# Patient Record
Sex: Female | Born: 1945 | Race: White | Hispanic: No | Marital: Married | Smoking: Former smoker
Health system: Southern US, Community
[De-identification: ages and names within clinical notes are randomized; demographics above are authoritative.]

## PROBLEM LIST (undated history)

## (undated) DIAGNOSIS — Z87440 Personal history of urinary (tract) infections: Secondary | ICD-10-CM

## (undated) DIAGNOSIS — E785 Hyperlipidemia, unspecified: Secondary | ICD-10-CM

## (undated) DIAGNOSIS — F32A Depression, unspecified: Secondary | ICD-10-CM

## (undated) DIAGNOSIS — F329 Major depressive disorder, single episode, unspecified: Secondary | ICD-10-CM

## (undated) DIAGNOSIS — Z8701 Personal history of pneumonia (recurrent): Secondary | ICD-10-CM

## (undated) DIAGNOSIS — G4730 Sleep apnea, unspecified: Secondary | ICD-10-CM

## (undated) DIAGNOSIS — F4310 Post-traumatic stress disorder, unspecified: Secondary | ICD-10-CM

## (undated) DIAGNOSIS — J449 Chronic obstructive pulmonary disease, unspecified: Secondary | ICD-10-CM

## (undated) DIAGNOSIS — I10 Essential (primary) hypertension: Secondary | ICD-10-CM

## (undated) DIAGNOSIS — R0600 Dyspnea, unspecified: Secondary | ICD-10-CM

## (undated) DIAGNOSIS — R7303 Prediabetes: Secondary | ICD-10-CM

## (undated) DIAGNOSIS — J189 Pneumonia, unspecified organism: Secondary | ICD-10-CM

## (undated) DIAGNOSIS — E039 Hypothyroidism, unspecified: Secondary | ICD-10-CM

## (undated) DIAGNOSIS — M810 Age-related osteoporosis without current pathological fracture: Secondary | ICD-10-CM

## (undated) DIAGNOSIS — G4700 Insomnia, unspecified: Secondary | ICD-10-CM

## (undated) DIAGNOSIS — G2581 Restless legs syndrome: Secondary | ICD-10-CM

## (undated) DIAGNOSIS — M1712 Unilateral primary osteoarthritis, left knee: Secondary | ICD-10-CM

## (undated) HISTORY — PX: TOTAL VAGINAL HYSTERECTOMY: SHX2548

## (undated) HISTORY — DX: Chronic obstructive pulmonary disease, unspecified: J44.9

## (undated) HISTORY — DX: Age-related osteoporosis without current pathological fracture: M81.0

## (undated) HISTORY — DX: Major depressive disorder, single episode, unspecified: F32.9

## (undated) HISTORY — DX: Depression, unspecified: F32.A

## (undated) HISTORY — DX: Hyperlipidemia, unspecified: E78.5

## (undated) HISTORY — DX: Unilateral primary osteoarthritis, left knee: M17.12

## (undated) HISTORY — DX: Hypothyroidism, unspecified: E03.9

## (undated) HISTORY — DX: Personal history of pneumonia (recurrent): Z87.01

## (undated) HISTORY — DX: Post-traumatic stress disorder, unspecified: F43.10

## (undated) HISTORY — DX: Restless legs syndrome: G25.81

## (undated) HISTORY — PX: ABDOMINAL HYSTERECTOMY: SHX81

---
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
## 2000-11-18 ENCOUNTER — Encounter: Payer: Self-pay | Admitting: Family Medicine

## 2000-11-18 ENCOUNTER — Ambulatory Visit (HOSPITAL_COMMUNITY): Admission: RE | Admit: 2000-11-18 | Discharge: 2000-11-18 | Payer: Self-pay | Admitting: Family Medicine

## 2001-04-17 ENCOUNTER — Encounter: Payer: Self-pay | Admitting: Family Medicine

## 2001-04-17 ENCOUNTER — Ambulatory Visit (HOSPITAL_COMMUNITY): Admission: RE | Admit: 2001-04-17 | Discharge: 2001-04-17 | Payer: Self-pay | Admitting: Family Medicine

## 2002-04-03 ENCOUNTER — Encounter: Payer: Self-pay | Admitting: Family Medicine

## 2002-04-03 ENCOUNTER — Ambulatory Visit (HOSPITAL_COMMUNITY): Admission: RE | Admit: 2002-04-03 | Discharge: 2002-04-03 | Payer: Self-pay | Admitting: Family Medicine

## 2002-12-01 ENCOUNTER — Ambulatory Visit (HOSPITAL_COMMUNITY): Admission: RE | Admit: 2002-12-01 | Discharge: 2002-12-01 | Payer: Self-pay | Admitting: Family Medicine

## 2002-12-01 ENCOUNTER — Encounter: Payer: Self-pay | Admitting: Family Medicine

## 2003-08-16 ENCOUNTER — Ambulatory Visit (HOSPITAL_COMMUNITY): Admission: RE | Admit: 2003-08-16 | Discharge: 2003-08-16 | Payer: Self-pay | Admitting: Family Medicine

## 2004-12-13 HISTORY — PX: COLONOSCOPY: SHX174

## 2005-03-09 ENCOUNTER — Emergency Department (HOSPITAL_COMMUNITY): Admission: EM | Admit: 2005-03-09 | Discharge: 2005-03-09 | Payer: Self-pay | Admitting: Family Medicine

## 2005-09-12 ENCOUNTER — Emergency Department (HOSPITAL_COMMUNITY): Admission: EM | Admit: 2005-09-12 | Discharge: 2005-09-12 | Payer: Self-pay | Admitting: Family Medicine

## 2007-07-24 ENCOUNTER — Emergency Department (HOSPITAL_COMMUNITY): Admission: EM | Admit: 2007-07-24 | Discharge: 2007-07-24 | Payer: Self-pay | Admitting: Emergency Medicine

## 2008-02-07 ENCOUNTER — Emergency Department (HOSPITAL_COMMUNITY): Admission: EM | Admit: 2008-02-07 | Discharge: 2008-02-07 | Payer: Self-pay | Admitting: Family Medicine

## 2008-04-09 DIAGNOSIS — Z8701 Personal history of pneumonia (recurrent): Secondary | ICD-10-CM

## 2008-04-09 HISTORY — DX: Personal history of pneumonia (recurrent): Z87.01

## 2011-01-02 LAB — POCT URINALYSIS DIP (DEVICE)
Bilirubin Urine: NEGATIVE
Glucose, UA: NEGATIVE
Ketones, ur: NEGATIVE
Nitrite: NEGATIVE
Operator id: 247071
Protein, ur: NEGATIVE
Specific Gravity, Urine: 1.01
Urobilinogen, UA: 0.2
pH: 7

## 2011-01-08 LAB — POCT URINALYSIS DIP (DEVICE)
Bilirubin Urine: NEGATIVE
Glucose, UA: NEGATIVE
Ketones, ur: NEGATIVE
Nitrite: NEGATIVE
Operator id: 247071
Protein, ur: NEGATIVE
Specific Gravity, Urine: <=1.005
Urobilinogen, UA: 0.2
pH: 5.5

## 2013-08-12 ENCOUNTER — Other Ambulatory Visit: Payer: Self-pay | Admitting: Physician Assistant

## 2013-08-12 ENCOUNTER — Encounter: Payer: Self-pay | Admitting: Physician Assistant

## 2013-08-12 DIAGNOSIS — F419 Anxiety disorder, unspecified: Secondary | ICD-10-CM

## 2013-08-12 DIAGNOSIS — F32A Depression, unspecified: Secondary | ICD-10-CM

## 2013-08-12 DIAGNOSIS — F329 Major depressive disorder, single episode, unspecified: Secondary | ICD-10-CM | POA: Insufficient documentation

## 2013-08-12 DIAGNOSIS — J449 Chronic obstructive pulmonary disease, unspecified: Secondary | ICD-10-CM | POA: Insufficient documentation

## 2013-08-12 DIAGNOSIS — G2581 Restless legs syndrome: Secondary | ICD-10-CM | POA: Insufficient documentation

## 2013-08-12 DIAGNOSIS — E039 Hypothyroidism, unspecified: Secondary | ICD-10-CM | POA: Insufficient documentation

## 2013-08-12 NOTE — H&P (Signed)
TOTAL KNEE ADMISSION H&P  Patient is being admitted for left knee arthroscopy with uni vs total knee arthroplasty.  Subjective:  Chief Complaint:left knee pain.  HPI: [name], 68 y.o. female, has a history of pain and functional disability in the left knee due to arthritis and has failed non-surgical conservative treatments for greater than 12 weeks to includeNSAID's and/or analgesics, corticosteriod injections, viscosupplementation injections, flexibility and strengthening excercises, supervised PT with diminished ADL's post treatment, use of assistive devices, weight reduction as appropriate and activity modification.  Onset of symptoms was gradual, starting 10 years ago with gradually worsening course since that time. The patient noted no past surgery on the left knee(s).  Patient currently rates pain in the left knee(s) at 10 out of 10 with activity. Patient has night pain, worsening of pain with activity and weight bearing, pain that interferes with activities of daily living, crepitus and joint swelling.  Patient has evidence of subchondral sclerosis, periarticular osteophytes and joint space narrowing by imaging studies.  There is no active infection.  Patient Active Problem List   Diagnosis Date Noted  . Hypothyroidism   . Restless leg syndrome   . Depression   . Anxiety   . COPD (chronic obstructive pulmonary disease)    Past Medical History  Diagnosis Date  . Anxiety   . Depression   . History of pneumonia 2010    in ICU  . Restless leg syndrome   . Hypothyroidism   . Left knee DJD   . COPD (chronic obstructive pulmonary disease)     Past Surgical History  Procedure Laterality Date  . Total vaginal hysterectomy       (Not in a hospital admission) No Known Allergies   Outpatient Encounter Prescriptions as of 08/12/2013  Medication Sig  . ALPRAZolam (XANAX) 0.5 MG tablet Take 0.5 mg by mouth at bedtime as needed for anxiety.  Marland Kitchen. amitriptyline (ELAVIL) 75 MG tablet  Take 75 mg by mouth at bedtime.  Marland Kitchen. levothyroxine (SYNTHROID, LEVOTHROID) 100 MCG tablet Take 100 mcg by mouth daily before breakfast.  . meloxicam (MOBIC) 15 MG tablet Take 15 mg by mouth daily.  . vitamin E 200 UNIT capsule Take 200 Units by mouth daily.   History  Substance Use Topics  . Smoking status: Former Smoker -- 1.50 packs/day for 30 years    Types: Cigarettes    Quit date: 04/10/2003  . Smokeless tobacco: Not on file  . Alcohol Use: No    Family History  Problem Relation Age of Onset  . Stroke Mother   . Heart disease Father   . Heart attack Father   . Hypertension Father      Review of Systems  Constitutional: Negative.   Eyes: Negative.   Respiratory: Negative.   Cardiovascular: Negative.   Gastrointestinal: Negative.   Genitourinary: Negative.   Musculoskeletal: Positive for joint pain.  Skin: Negative.   Neurological: Negative.   Endo/Heme/Allergies: Negative.   Psychiatric/Behavioral: Negative.     Objective:  Physical Exam  Constitutional: She is oriented to person, place, and time. She appears well-developed and well-nourished.  HENT:  Head: Normocephalic and atraumatic.  Mouth/Throat: Oropharynx is clear and moist.  Eyes: Conjunctivae are normal. Pupils are equal, round, and reactive to light.  Neck: Neck supple.  Cardiovascular: Normal rate, regular rhythm and normal heart sounds.   Respiratory: Effort normal and breath sounds normal.  GI: Soft.  Genitourinary:  Not pertinent to current symptomatology therefore not examined.  Musculoskeletal:  Well-developed well-nourished  white female in no acute distress. Alert and oriented. Examination of her left knee reveals pain medially, mild varus deformity 1+ crepitation 1+ synovitis, range of motion is -5 to 125 degrees knee is stable with diffuse pain and normal patella tracking. Exam of the right knee reveals full range of motion without pain swelling weakness or instability. Vascular exam: pulses 2+  and symmetric.  Neurological: She is alert and oriented to person, place, and time.  Skin: Skin is warm and dry.  Psychiatric: She has a normal mood and affect.    Vital signs in last 24 hours: Last recorded: 05/06 1300   BP: 130/74 Pulse: 96  Temp: 98.3 F (36.8 C)    Height: 5' (1.524 m) SpO2: 97  Weight: 62.143 kg (137 lb)     Labs:   Estimated body mass index is 26.76 kg/(m^2) as calculated from the following:   Height as of this encounter: 5' (1.524 m).   Weight as of this encounter: 62.143 kg (137 lb).   Imaging Review Plain radiographs demonstrate severe degenerative joint disease of the left knee(s). The overall alignment ismild varus. The bone quality appears to be good for age and reported activity level.  Assessment/Plan:  End stage arthritis, left knee Patient Active Problem List   Diagnosis Date Noted  . Hypothyroidism   . Restless leg syndrome   . Depression   . Anxiety   . COPD (chronic obstructive pulmonary disease)      The patient history, physical examination, clinical judgment of the provider and imaging studies are consistent with end stage degenerative joint disease of the left knee(s) and total knee arthroplasty is deemed medically necessary. The treatment options including medical management, injection therapy arthroscopy and arthroplasty were discussed at length. The risks and benefits of total knee arthroplasty were presented and reviewed. The risks due to aseptic loosening, infection, stiffness, patella tracking problems, thromboembolic complications and other imponderables were discussed. The patient acknowledged the explanation, agreed to proceed with the plan and consent was signed. Patient is being admitted for inpatient treatment for surgery, pain control, PT, OT, prophylactic antibiotics, VTE prophylaxis, progressive ambulation and ADL's and discharge planning. The patient is planning to be discharged home with home health services Camaria Gerald A.  Jacquita Mulhearn, PA-C Physician Assistant [facility] [phone]  08/12/2013, 2:21 PM   

## 2013-08-14 ENCOUNTER — Ambulatory Visit (HOSPITAL_COMMUNITY)
Admission: RE | Admit: 2013-08-14 | Discharge: 2013-08-14 | Disposition: A | Discharge status: Home / Self Care | Payer: Medicare Other | Source: Ambulatory Visit | Attending: Physician Assistant | Admitting: Physician Assistant

## 2013-08-14 ENCOUNTER — Encounter (HOSPITAL_COMMUNITY): Payer: Self-pay

## 2013-08-14 ENCOUNTER — Encounter (HOSPITAL_COMMUNITY)
Admission: RE | Admit: 2013-08-14 | Discharge: 2013-08-14 | Disposition: A | Discharge status: Home / Self Care | Payer: Medicare Other | Source: Ambulatory Visit | Attending: Orthopedic Surgery | Admitting: Orthopedic Surgery

## 2013-08-14 DIAGNOSIS — F411 Generalized anxiety disorder: Secondary | ICD-10-CM | POA: Insufficient documentation

## 2013-08-14 DIAGNOSIS — J4489 Other specified chronic obstructive pulmonary disease: Secondary | ICD-10-CM | POA: Insufficient documentation

## 2013-08-14 DIAGNOSIS — Z01810 Encounter for preprocedural cardiovascular examination: Secondary | ICD-10-CM | POA: Insufficient documentation

## 2013-08-14 DIAGNOSIS — Z01818 Encounter for other preprocedural examination: Secondary | ICD-10-CM | POA: Insufficient documentation

## 2013-08-14 DIAGNOSIS — F3289 Other specified depressive episodes: Secondary | ICD-10-CM | POA: Insufficient documentation

## 2013-08-14 DIAGNOSIS — F329 Major depressive disorder, single episode, unspecified: Secondary | ICD-10-CM | POA: Insufficient documentation

## 2013-08-14 DIAGNOSIS — Z87891 Personal history of nicotine dependence: Secondary | ICD-10-CM | POA: Insufficient documentation

## 2013-08-14 DIAGNOSIS — E039 Hypothyroidism, unspecified: Secondary | ICD-10-CM | POA: Insufficient documentation

## 2013-08-14 DIAGNOSIS — Z01812 Encounter for preprocedural laboratory examination: Secondary | ICD-10-CM | POA: Insufficient documentation

## 2013-08-14 DIAGNOSIS — M1710 Unilateral primary osteoarthritis, unspecified knee: Secondary | ICD-10-CM | POA: Insufficient documentation

## 2013-08-14 DIAGNOSIS — J449 Chronic obstructive pulmonary disease, unspecified: Secondary | ICD-10-CM | POA: Insufficient documentation

## 2013-08-14 HISTORY — DX: Pneumonia, unspecified organism: J18.9

## 2013-08-14 HISTORY — DX: Personal history of urinary (tract) infections: Z87.440

## 2013-08-14 LAB — CBC WITH DIFFERENTIAL/PLATELET
BASOS PCT: 0 % (ref 0–1)
Basophils Absolute: 0 10*3/uL (ref 0.0–0.1)
Eosinophils Absolute: 0.3 10*3/uL (ref 0.0–0.7)
Eosinophils Relative: 4 % (ref 0–5)
HEMATOCRIT: 40.4 % (ref 36.0–46.0)
HEMOGLOBIN: 12.8 g/dL (ref 12.0–15.0)
LYMPHS ABS: 2.2 10*3/uL (ref 0.7–4.0)
LYMPHS PCT: 24 % (ref 12–46)
MCH: 26.8 pg (ref 26.0–34.0)
MCHC: 31.7 g/dL (ref 30.0–36.0)
MCV: 84.7 fL (ref 78.0–100.0)
MONO ABS: 0.7 10*3/uL (ref 0.1–1.0)
MONOS PCT: 8 % (ref 3–12)
NEUTROS PCT: 64 % (ref 43–77)
Neutro Abs: 5.7 10*3/uL (ref 1.7–7.7)
Platelets: 317 10*3/uL (ref 150–400)
RBC: 4.77 MIL/uL (ref 3.87–5.11)
RDW: 14.4 % (ref 11.5–15.5)
WBC: 8.9 10*3/uL (ref 4.0–10.5)

## 2013-08-14 LAB — URINALYSIS, ROUTINE W REFLEX MICROSCOPIC
BILIRUBIN URINE: NEGATIVE
GLUCOSE, UA: NEGATIVE mg/dL
Hgb urine dipstick: NEGATIVE
Ketones, ur: NEGATIVE mg/dL
LEUKOCYTES UA: NEGATIVE
NITRITE: NEGATIVE
Protein, ur: NEGATIVE mg/dL
SPECIFIC GRAVITY, URINE: 1.011 (ref 1.005–1.030)
Urobilinogen, UA: 0.2 mg/dL (ref 0.0–1.0)
pH: 6 (ref 5.0–8.0)

## 2013-08-14 LAB — COMPREHENSIVE METABOLIC PANEL
ALBUMIN: 4 g/dL (ref 3.5–5.2)
ALK PHOS: 128 U/L — AB (ref 39–117)
ALT: 27 U/L (ref 0–35)
AST: 28 U/L (ref 0–37)
BILIRUBIN TOTAL: 0.3 mg/dL (ref 0.3–1.2)
BUN: 17 mg/dL (ref 6–23)
CHLORIDE: 104 meq/L (ref 96–112)
CO2: 24 meq/L (ref 19–32)
CREATININE: 0.84 mg/dL (ref 0.50–1.10)
Calcium: 10.3 mg/dL (ref 8.4–10.5)
GFR calc Af Amer: 83 mL/min — ABNORMAL LOW (ref >90)
GFR calc non Af Amer: 71 mL/min — ABNORMAL LOW (ref >90)
GLUCOSE: 85 mg/dL (ref 70–99)
POTASSIUM: 5.3 meq/L (ref 3.7–5.3)
Sodium: 141 mEq/L (ref 137–147)
Total Protein: 6.9 g/dL (ref 6.0–8.3)

## 2013-08-14 LAB — PROTIME-INR
INR: 0.9 (ref 0.00–1.49)
Prothrombin Time: 12 seconds (ref 11.6–15.2)

## 2013-08-14 LAB — TYPE AND SCREEN
ABO/RH(D): O POS
Antibody Screen: NEGATIVE

## 2013-08-14 LAB — ABO/RH: ABO/RH(D): O POS

## 2013-08-14 LAB — SURGICAL PCR SCREEN
MRSA, PCR: POSITIVE — AB
Staphylococcus aureus: POSITIVE — AB

## 2013-08-14 LAB — APTT: aPTT: 31 seconds (ref 24–37)

## 2013-08-14 NOTE — Pre-Procedure Instructions (Addendum)
[name] [name]  08/14/2013   Your procedure is scheduled on:  08/24/13  [facility] at 530 AM.  Call this number if you have problems the morning of surgery: [phone]   Remember:   Do not eat food or drink liquids after midnight.   Take these medicines the morning of surgery with A SIP OF WATER:   inhaler if needed,bring with you      Take all meds as ordered until day of surgery except as instructed below or per dr     Despina AriasSTOP all herbel meds, nsaids (aleve,naproxen,advil,ibuprofen) 5 days prior to surgery including mobic, vit e, aspirin   Do not wear jewelry, make-up or nail polish.  Do not wear lotions, powders, or perfumes. You may wear deodorant.  Do not shave 48 hours prior to surgery. Men may shave face and neck.  Do not bring valuables to the hospital.  [facility] is not responsible                  for any belongings or valuables.               Contacts, dentures or bridgework may not be worn into surgery.  Leave suitcase in the car. After surgery it may be brought to your room.  For patients admitted to the hospital, discharge time is determined by your                treatment team.               Patients discharged the day of surgery will not be allowed to drive  home.  Name and phone number of your driver:   Special Instructions:  Special Instructions: [facility] - Preparing for Surgery  Before surgery, you can play an important role.  Because skin is not sterile, your skin needs to be as free of germs as possible.  You can reduce the number of germs on you skin by washing with CHG (chlorahexidine gluconate) soap before surgery.  CHG is an antiseptic cleaner which kills germs and bonds with the skin to continue killing germs even after washing.  Please DO NOT use if you have an allergy to CHG or antibacterial soaps.  If your skin becomes reddened/irritated stop using the CHG and inform your nurse when you arrive at Short Stay.  Do not  shave (including legs and underarms) for at least 48 hours prior to the first CHG shower.  You may shave your face.  Please follow these instructions carefully:   1.  Shower with CHG Soap the night before surgery and the morning of Surgery.  2.  If you choose to wash your hair, wash your hair first as usual with your normal shampoo.  3.  After you shampoo, rinse your hair and body thoroughly to remove the Shampoo.  4.  Use CHG as you would any other liquid soap.  You can apply chg directly  to the skin and wash gently with scrungie or a clean washcloth.  5.  Apply the CHG Soap to your body ONLY FROM THE NECK DOWN.  Do not use on open wounds or open sores.  Avoid contact with your eyes ears, mouth and genitals (private parts).  Wash genitals (private parts)       with your normal soap.  6.  Wash thoroughly, paying special attention to the area where your surgery will be performed.  7.  Thoroughly rinse your body with  warm water from the neck down.  8.  DO NOT shower/wash with your normal soap after using and rinsing off the CHG Soap.  9.  Pat yourself dry with a clean towel.            10.  Wear clean pajamas.            11.  Place clean sheets on your bed the night of your first shower and do not sleep with pets.  Day of Surgery  Do not apply any lotions/deodorants the morning of surgery.  Please wear clean clothes to the hospital/surgery center.   Please read over the following fact sheets that you were given: Pain Booklet, Coughing and Deep Breathing, Blood Transfusion Information, Total Joint Packet, MRSA Information and Surgical Site Infection Prevention

## 2013-08-15 LAB — URINE CULTURE

## 2013-08-17 NOTE — Progress Notes (Signed)
Anesthesia Chart Review:  Patient is a 68 year old female scheduled for left knee arthroscopy with uni-versus total knee replacement on 08/24/13 by Dr. Salvatore Marvelobert [facility].    History includes former smoker, COPD, PNA with VDRF '10, RLS, hypothyroidism, anxiety, depression, hysterectomy, childhood hematuria. PCP is listed as Dr. Kirstie PeriAshish Shah.  Patient told her PAT RN that she had no acute COPD issues since 2013.  EKG on 08/14/13 showed NSR.  Preoperative CXR and labs noted.  Urine culture showed insignificant growth.  Further evaluation by her assigned anesthesiologist on the day of surgery, but if no acute changes that I would anticipate she could proceed as planned.  Velna Ochsllison Kanchan Gal, PA-C [facility]/Anesthesiology Phone [phone] 08/17/2013 1:28 PM

## 2013-08-23 MED ORDER — CHLORHEXIDINE GLUCONATE 4 % EX LIQD
60.0000 mL | Freq: Once | CUTANEOUS | Status: DC
  Filled 2013-08-23: qty 60

## 2013-08-23 MED ORDER — POVIDONE-IODINE 7.5 % EX SOLN
Freq: Once | CUTANEOUS | Status: DC
  Filled 2013-08-23: qty 118

## 2013-08-23 MED ORDER — CEFAZOLIN SODIUM-DEXTROSE 2-3 GM-% IV SOLR
2.0000 g | INTRAVENOUS | Status: DC
  Filled 2013-08-23: qty 50

## 2013-08-24 ENCOUNTER — Encounter (HOSPITAL_COMMUNITY): Payer: Self-pay | Admitting: *Deleted

## 2013-08-24 ENCOUNTER — Encounter (HOSPITAL_COMMUNITY)
Admission: RE | Disposition: A | Discharge status: Home Health | Payer: Self-pay | Source: Ambulatory Visit | Attending: Orthopedic Surgery

## 2013-08-24 ENCOUNTER — Inpatient Hospital Stay (HOSPITAL_COMMUNITY)
Admission: RE | Admit: 2013-08-24 | Discharge: 2013-08-25 | DRG: 470 | Disposition: A | Discharge status: Home Health | Payer: Medicare Other | Source: Ambulatory Visit | Attending: Orthopedic Surgery | Admitting: Orthopedic Surgery

## 2013-08-24 ENCOUNTER — Encounter (HOSPITAL_COMMUNITY): Payer: Medicare Other | Admitting: Vascular Surgery

## 2013-08-24 ENCOUNTER — Inpatient Hospital Stay (HOSPITAL_COMMUNITY): Payer: Medicare Other | Admitting: Anesthesiology

## 2013-08-24 DIAGNOSIS — F32A Depression, unspecified: Secondary | ICD-10-CM | POA: Diagnosis present

## 2013-08-24 DIAGNOSIS — Z8249 Family history of ischemic heart disease and other diseases of the circulatory system: Secondary | ICD-10-CM

## 2013-08-24 DIAGNOSIS — F411 Generalized anxiety disorder: Secondary | ICD-10-CM | POA: Diagnosis present

## 2013-08-24 DIAGNOSIS — Z87891 Personal history of nicotine dependence: Secondary | ICD-10-CM

## 2013-08-24 DIAGNOSIS — F3289 Other specified depressive episodes: Secondary | ICD-10-CM | POA: Diagnosis present

## 2013-08-24 DIAGNOSIS — F329 Major depressive disorder, single episode, unspecified: Secondary | ICD-10-CM | POA: Diagnosis present

## 2013-08-24 DIAGNOSIS — F419 Anxiety disorder, unspecified: Secondary | ICD-10-CM | POA: Diagnosis present

## 2013-08-24 DIAGNOSIS — M2240 Chondromalacia patellae, unspecified knee: Secondary | ICD-10-CM | POA: Diagnosis present

## 2013-08-24 DIAGNOSIS — G2581 Restless legs syndrome: Secondary | ICD-10-CM | POA: Diagnosis present

## 2013-08-24 DIAGNOSIS — J4489 Other specified chronic obstructive pulmonary disease: Secondary | ICD-10-CM | POA: Diagnosis present

## 2013-08-24 DIAGNOSIS — M1710 Unilateral primary osteoarthritis, unspecified knee: Principal | ICD-10-CM | POA: Diagnosis present

## 2013-08-24 DIAGNOSIS — M179 Osteoarthritis of knee, unspecified: Secondary | ICD-10-CM | POA: Diagnosis present

## 2013-08-24 DIAGNOSIS — M1712 Unilateral primary osteoarthritis, left knee: Secondary | ICD-10-CM | POA: Diagnosis present

## 2013-08-24 DIAGNOSIS — E039 Hypothyroidism, unspecified: Secondary | ICD-10-CM | POA: Diagnosis present

## 2013-08-24 DIAGNOSIS — Z823 Family history of stroke: Secondary | ICD-10-CM

## 2013-08-24 DIAGNOSIS — M23305 Other meniscus derangements, unspecified medial meniscus, unspecified knee: Secondary | ICD-10-CM | POA: Diagnosis present

## 2013-08-24 DIAGNOSIS — J449 Chronic obstructive pulmonary disease, unspecified: Secondary | ICD-10-CM | POA: Diagnosis present

## 2013-08-24 HISTORY — PX: KNEE ARTHROSCOPY WITH MEDIAL MENISECTOMY: SHX5651

## 2013-08-24 HISTORY — PX: PARTIAL KNEE ARTHROPLASTY: SHX2174

## 2013-08-24 SURGERY — ARTHROPLASTY, KNEE, UNICOMPARTMENTAL
Anesthesia: Regional | Site: Knee | Laterality: Left

## 2013-08-24 MED ORDER — FENTANYL CITRATE 0.05 MG/ML IJ SOLN
INTRAMUSCULAR | Status: AC
  Filled 2013-08-24: qty 5

## 2013-08-24 MED ORDER — ROCURONIUM BROMIDE 100 MG/10ML IV SOLN
INTRAVENOUS | Status: DC | PRN
  Administered 2013-08-24: 35 mg via INTRAVENOUS

## 2013-08-24 MED ORDER — VITAMIN D3 25 MCG (1000 UNIT) PO TABS
2000.0000 [IU] | ORAL_TABLET | Freq: Every day | ORAL | Status: DC
  Administered 2013-08-24 – 2013-08-25 (×2): 2000 [IU] via ORAL
  Filled 2013-08-24 (×2): qty 2

## 2013-08-24 MED ORDER — PROPOFOL 10 MG/ML IV BOLUS
INTRAVENOUS | Status: AC
  Filled 2013-08-24: qty 20

## 2013-08-24 MED ORDER — OXYCODONE HCL 5 MG PO TABS: 5.0000 mg | ORAL_TABLET | Freq: Once | ORAL | Status: DC | PRN

## 2013-08-24 MED ORDER — VITAMIN D 50 MCG (2000 UT) PO CAPS: 2000.0000 [IU] | ORAL_CAPSULE | Freq: Every day | ORAL | Status: DC

## 2013-08-24 MED ORDER — GLYCOPYRROLATE 0.2 MG/ML IJ SOLN
INTRAMUSCULAR | Status: AC
  Filled 2013-08-24: qty 2

## 2013-08-24 MED ORDER — OXYCODONE HCL 5 MG/5ML PO SOLN: 5.0000 mg | Freq: Once | ORAL | Status: DC | PRN

## 2013-08-24 MED ORDER — DOCUSATE SODIUM 100 MG PO CAPS
100.0000 mg | ORAL_CAPSULE | Freq: Two times a day (BID) | ORAL | Status: DC
  Administered 2013-08-24 – 2013-08-25 (×2): 100 mg via ORAL
  Filled 2013-08-24 (×3): qty 1

## 2013-08-24 MED ORDER — ACETAMINOPHEN 325 MG PO TABS: 650.0000 mg | ORAL_TABLET | Freq: Four times a day (QID) | ORAL | Status: DC | PRN

## 2013-08-24 MED ORDER — AMITRIPTYLINE HCL 75 MG PO TABS
75.0000 mg | ORAL_TABLET | Freq: Every day | ORAL | Status: DC
  Administered 2013-08-24: 75 mg via ORAL
  Filled 2013-08-24 (×2): qty 1

## 2013-08-24 MED ORDER — ALBUTEROL SULFATE (2.5 MG/3ML) 0.083% IN NEBU: 2.5000 mg | INHALATION_SOLUTION | Freq: Four times a day (QID) | RESPIRATORY_TRACT | Status: DC | PRN

## 2013-08-24 MED ORDER — TOBRAMYCIN SULFATE 1.2 G IJ SOLR
INTRAMUSCULAR | Status: DC | PRN
  Administered 2013-08-24: 1.2 g

## 2013-08-24 MED ORDER — CEFAZOLIN SODIUM-DEXTROSE 2-3 GM-% IV SOLR
INTRAVENOUS | Status: DC | PRN
  Administered 2013-08-24: 2 g via INTRAVENOUS

## 2013-08-24 MED ORDER — MIDAZOLAM HCL 5 MG/5ML IJ SOLN
INTRAMUSCULAR | Status: DC | PRN
  Administered 2013-08-24 (×2): 1 mg via INTRAVENOUS

## 2013-08-24 MED ORDER — ACETAMINOPHEN 650 MG RE SUPP: 650.0000 mg | Freq: Four times a day (QID) | RECTAL | Status: DC | PRN

## 2013-08-24 MED ORDER — CEFAZOLIN SODIUM 1-5 GM-% IV SOLN
1.0000 g | Freq: Four times a day (QID) | INTRAVENOUS | Status: AC
  Administered 2013-08-24 – 2013-08-25 (×2): 1 g via INTRAVENOUS
  Filled 2013-08-24 (×2): qty 50

## 2013-08-24 MED ORDER — HYDROMORPHONE HCL PF 1 MG/ML IJ SOLN
INTRAMUSCULAR | Status: AC
  Filled 2013-08-24: qty 1

## 2013-08-24 MED ORDER — LIDOCAINE HCL (CARDIAC) 20 MG/ML IV SOLN
INTRAVENOUS | Status: AC
  Filled 2013-08-24: qty 5

## 2013-08-24 MED ORDER — VANCOMYCIN HCL IN DEXTROSE 1-5 GM/200ML-% IV SOLN
1000.0000 mg | INTRAVENOUS | Status: AC
  Filled 2013-08-24: qty 200

## 2013-08-24 MED ORDER — LIDOCAINE HCL (CARDIAC) 20 MG/ML IV SOLN
INTRAVENOUS | Status: DC | PRN
  Administered 2013-08-24: 40 mg via INTRAVENOUS

## 2013-08-24 MED ORDER — ROCURONIUM BROMIDE 50 MG/5ML IV SOLN
INTRAVENOUS | Status: AC
  Filled 2013-08-24: qty 1

## 2013-08-24 MED ORDER — ARTIFICIAL TEARS OP OINT
TOPICAL_OINTMENT | OPHTHALMIC | Status: DC | PRN
  Administered 2013-08-24: 1 via OPHTHALMIC

## 2013-08-24 MED ORDER — CELECOXIB 200 MG PO CAPS
200.0000 mg | ORAL_CAPSULE | Freq: Two times a day (BID) | ORAL | Status: DC
  Administered 2013-08-24 – 2013-08-25 (×2): 200 mg via ORAL
  Filled 2013-08-24 (×4): qty 1

## 2013-08-24 MED ORDER — BISACODYL 5 MG PO TBEC
10.0000 mg | DELAYED_RELEASE_TABLET | Freq: Every day | ORAL | Status: DC
  Administered 2013-08-24: 10 mg via ORAL
  Filled 2013-08-24: qty 2

## 2013-08-24 MED ORDER — EPINEPHRINE HCL 1 MG/ML IJ SOLN
INTRAMUSCULAR | Status: AC
  Filled 2013-08-24: qty 1

## 2013-08-24 MED ORDER — MIDAZOLAM HCL 2 MG/2ML IJ SOLN
INTRAMUSCULAR | Status: AC
  Filled 2013-08-24: qty 2

## 2013-08-24 MED ORDER — METOCLOPRAMIDE HCL 10 MG PO TABS: 5.0000 mg | ORAL_TABLET | Freq: Three times a day (TID) | ORAL | Status: DC | PRN

## 2013-08-24 MED ORDER — 0.9 % SODIUM CHLORIDE (POUR BTL) OPTIME
TOPICAL | Status: DC | PRN
  Administered 2013-08-24: 1000 mL

## 2013-08-24 MED ORDER — ONDANSETRON HCL 4 MG/2ML IJ SOLN
INTRAMUSCULAR | Status: AC
  Filled 2013-08-24: qty 2

## 2013-08-24 MED ORDER — DIPHENHYDRAMINE HCL 12.5 MG/5ML PO ELIX: 12.5000 mg | ORAL_SOLUTION | ORAL | Status: DC | PRN

## 2013-08-24 MED ORDER — FENTANYL CITRATE 0.05 MG/ML IJ SOLN
INTRAMUSCULAR | Status: DC | PRN
  Administered 2013-08-24: 50 ug via INTRAVENOUS
  Administered 2013-08-24: 100 ug via INTRAVENOUS
  Administered 2013-08-24 (×2): 50 ug via INTRAVENOUS

## 2013-08-24 MED ORDER — SODIUM CHLORIDE 0.9 % IR SOLN
Status: DC | PRN
  Administered 2013-08-24: 1000 mL

## 2013-08-24 MED ORDER — BUPIVACAINE-EPINEPHRINE (PF) 0.5% -1:200000 IJ SOLN
INTRAMUSCULAR | Status: AC
  Filled 2013-08-24: qty 30

## 2013-08-24 MED ORDER — HYDROMORPHONE HCL PF 1 MG/ML IJ SOLN
0.2500 mg | INTRAMUSCULAR | Status: DC | PRN
  Administered 2013-08-24 (×2): 0.5 mg via INTRAVENOUS

## 2013-08-24 MED ORDER — ONDANSETRON HCL 4 MG/2ML IJ SOLN
4.0000 mg | Freq: Four times a day (QID) | INTRAMUSCULAR | Status: DC | PRN
  Administered 2013-08-24: 4 mg via INTRAVENOUS
  Filled 2013-08-24: qty 2

## 2013-08-24 MED ORDER — TOBRAMYCIN SULFATE 1.2 G IJ SOLR
INTRAMUSCULAR | Status: AC
  Filled 2013-08-24: qty 1.2

## 2013-08-24 MED ORDER — BUPIVACAINE-EPINEPHRINE 0.25% -1:200000 IJ SOLN
INTRAMUSCULAR | Status: DC | PRN
  Administered 2013-08-24: 30 mL

## 2013-08-24 MED ORDER — ONDANSETRON HCL 4 MG PO TABS
4.0000 mg | ORAL_TABLET | Freq: Four times a day (QID) | ORAL | Status: DC | PRN
  Administered 2013-08-25: 4 mg via ORAL
  Filled 2013-08-24: qty 1

## 2013-08-24 MED ORDER — DEXAMETHASONE 6 MG PO TABS
10.0000 mg | ORAL_TABLET | Freq: Three times a day (TID) | ORAL | Status: AC
  Administered 2013-08-24 – 2013-08-25 (×2): 10 mg via ORAL
  Filled 2013-08-24 (×3): qty 1

## 2013-08-24 MED ORDER — BUPIVACAINE-EPINEPHRINE (PF) 0.25% -1:200000 IJ SOLN
INTRAMUSCULAR | Status: AC
  Filled 2013-08-24: qty 30

## 2013-08-24 MED ORDER — OXYCODONE HCL 5 MG PO TABS
5.0000 mg | ORAL_TABLET | ORAL | Status: DC | PRN
  Administered 2013-08-24: 5 mg via ORAL
  Administered 2013-08-24 – 2013-08-25 (×4): 10 mg via ORAL
  Filled 2013-08-24: qty 1
  Filled 2013-08-24 (×4): qty 2

## 2013-08-24 MED ORDER — ALUM & MAG HYDROXIDE-SIMETH 200-200-20 MG/5ML PO SUSP: 30.0000 mL | ORAL | Status: DC | PRN

## 2013-08-24 MED ORDER — RIVAROXABAN 10 MG PO TABS
10.0000 mg | ORAL_TABLET | Freq: Every day | ORAL | Status: DC
Start: 2013-08-25 — End: 2013-08-25
  Administered 2013-08-25: 10 mg via ORAL
  Filled 2013-08-24 (×2): qty 1

## 2013-08-24 MED ORDER — LACTATED RINGERS IV SOLN
INTRAVENOUS | Status: DC | PRN
  Administered 2013-08-24: 07:00:00 via INTRAVENOUS

## 2013-08-24 MED ORDER — NEOSTIGMINE METHYLSULFATE 10 MG/10ML IV SOLN
INTRAVENOUS | Status: AC
  Filled 2013-08-24: qty 1

## 2013-08-24 MED ORDER — DEXAMETHASONE SODIUM PHOSPHATE 10 MG/ML IJ SOLN
10.0000 mg | Freq: Three times a day (TID) | INTRAMUSCULAR | Status: AC
  Administered 2013-08-24: 10 mg via INTRAVENOUS
  Filled 2013-08-24 (×3): qty 1

## 2013-08-24 MED ORDER — CEFUROXIME SODIUM 1.5 G IJ SOLR
INTRAMUSCULAR | Status: AC
  Filled 2013-08-24: qty 1.5

## 2013-08-24 MED ORDER — HYDROMORPHONE HCL PF 1 MG/ML IJ SOLN
0.5000 mg | INTRAMUSCULAR | Status: DC | PRN
  Administered 2013-08-24: 1 mg via INTRAVENOUS
  Administered 2013-08-24: 0.5 mg via INTRAVENOUS
  Administered 2013-08-25: 1 mg via INTRAVENOUS
  Filled 2013-08-24 (×3): qty 1

## 2013-08-24 MED ORDER — SODIUM CHLORIDE 0.9 % IV SOLN
1000.0000 mg | INTRAVENOUS | Status: DC | PRN
  Administered 2013-08-24: 1000 mg via INTRAVENOUS

## 2013-08-24 MED ORDER — ONDANSETRON HCL 4 MG/2ML IJ SOLN
INTRAMUSCULAR | Status: DC | PRN
  Administered 2013-08-24: 4 mg via INTRAVENOUS

## 2013-08-24 MED ORDER — NEOSTIGMINE METHYLSULFATE 10 MG/10ML IV SOLN
INTRAVENOUS | Status: DC | PRN
  Administered 2013-08-24: 3 mg via INTRAVENOUS

## 2013-08-24 MED ORDER — POTASSIUM CHLORIDE IN NACL 20-0.9 MEQ/L-% IV SOLN
INTRAVENOUS | Status: DC
  Administered 2013-08-25: via INTRAVENOUS
  Filled 2013-08-24 (×5): qty 1000

## 2013-08-24 MED ORDER — PHENOL 1.4 % MT LIQD: 1.0000 | OROMUCOSAL | Status: DC | PRN

## 2013-08-24 MED ORDER — LACTATED RINGERS IV SOLN: INTRAVENOUS | Status: DC

## 2013-08-24 MED ORDER — ALPRAZOLAM 0.5 MG PO TABS: 0.5000 mg | ORAL_TABLET | Freq: Every evening | ORAL | Status: DC | PRN

## 2013-08-24 MED ORDER — METOCLOPRAMIDE HCL 5 MG/ML IJ SOLN
5.0000 mg | Freq: Three times a day (TID) | INTRAMUSCULAR | Status: DC | PRN
  Administered 2013-08-24: 10 mg via INTRAVENOUS
  Filled 2013-08-24: qty 2

## 2013-08-24 MED ORDER — VANCOMYCIN HCL IN DEXTROSE 1-5 GM/200ML-% IV SOLN
1000.0000 mg | Freq: Two times a day (BID) | INTRAVENOUS | Status: AC
  Administered 2013-08-24: 1000 mg via INTRAVENOUS
  Filled 2013-08-24: qty 200

## 2013-08-24 MED ORDER — GLYCOPYRROLATE 0.2 MG/ML IJ SOLN
INTRAMUSCULAR | Status: DC | PRN
  Administered 2013-08-24: 0.4 mg via INTRAVENOUS

## 2013-08-24 MED ORDER — MENTHOL 3 MG MT LOZG: 1.0000 | LOZENGE | OROMUCOSAL | Status: DC | PRN

## 2013-08-24 MED ORDER — PROPOFOL 10 MG/ML IV BOLUS
INTRAVENOUS | Status: DC | PRN
  Administered 2013-08-24: 150 mg via INTRAVENOUS

## 2013-08-24 MED ORDER — LEVOTHYROXINE SODIUM 100 MCG PO TABS
100.0000 ug | ORAL_TABLET | Freq: Every day | ORAL | Status: DC
  Administered 2013-08-25: 100 ug via ORAL
  Filled 2013-08-24 (×2): qty 1

## 2013-08-24 MED ORDER — ARTIFICIAL TEARS OP OINT
TOPICAL_OINTMENT | OPHTHALMIC | Status: AC
  Filled 2013-08-24: qty 3.5

## 2013-08-24 MED ORDER — ALBUTEROL SULFATE HFA 108 (90 BASE) MCG/ACT IN AERS: 1.0000 | INHALATION_SPRAY | Freq: Four times a day (QID) | RESPIRATORY_TRACT | Status: DC | PRN

## 2013-08-24 MED ORDER — ONDANSETRON HCL 4 MG/2ML IJ SOLN: 4.0000 mg | Freq: Once | INTRAMUSCULAR | Status: DC | PRN

## 2013-08-24 SURGICAL SUPPLY — 106 items
BANDAGE ELASTIC 4 VELCRO ST LF (GAUZE/BANDAGES/DRESSINGS) ×1 IMPLANT
BANDAGE ELASTIC 6 VELCRO ST LF (GAUZE/BANDAGES/DRESSINGS) ×1 IMPLANT
BANDAGE ESMARK 6X9 LF (GAUZE/BANDAGES/DRESSINGS) ×2 IMPLANT
BLADE 10 SAFETY STRL DISP (BLADE) ×3 IMPLANT
BLADE CUDA 5.5 (BLADE) IMPLANT
BLADE CUTTER GATOR 3.5 (BLADE) ×2 IMPLANT
BLADE GREAT WHITE 4.2 (BLADE) ×3 IMPLANT
BLADE SAGITTAL 25.0X1.19X90 (BLADE) ×3 IMPLANT
BLADE SAW SGTL 11.0X1.19X90.0M (BLADE) IMPLANT
BLADE SAW SGTL 13.0X1.19X90.0M (BLADE) ×3 IMPLANT
BLADE SURG 10 STRL SS (BLADE) ×6 IMPLANT
BLADE SURG 11 STRL SS (BLADE) ×3 IMPLANT
BNDG CMPR 9X6 STRL LF SNTH (GAUZE/BANDAGES/DRESSINGS) ×2
BNDG CMPR MED 15X6 ELC VLCR LF (GAUZE/BANDAGES/DRESSINGS) ×2
BNDG ELASTIC 6X15 VLCR STRL LF (GAUZE/BANDAGES/DRESSINGS) ×3 IMPLANT
BNDG ESMARK 6X9 LF (GAUZE/BANDAGES/DRESSINGS) ×3
BOWL SMART MIX CTS (DISPOSABLE) ×3 IMPLANT
BUR OVAL 6.0 (BURR) IMPLANT
CAPT KNEE OXFORD ×2 IMPLANT
CEMENT HV SMART SET (Cement) ×6 IMPLANT
CO AXIAL FAN SPRAY TIP SOFT SH (MISCELLANEOUS) ×4 IMPLANT
COVER SURGICAL LIGHT HANDLE (MISCELLANEOUS) ×3 IMPLANT
CUFF TOURNIQUET SINGLE 34IN LL (TOURNIQUET CUFF) ×3 IMPLANT
DRAPE ARTHROSCOPY W/POUCH 114 (DRAPES) ×3 IMPLANT
DRAPE EXTREMITY T 121X128X90 (DRAPE) ×3 IMPLANT
DRAPE INCISE IOBAN 66X45 STRL (DRAPES) ×3 IMPLANT
DRAPE PROXIMA HALF (DRAPES) ×3 IMPLANT
DRAPE U-SHAPE 47X51 STRL (DRAPES) ×3 IMPLANT
DRSG ADAPTIC 3X8 NADH LF (GAUZE/BANDAGES/DRESSINGS) ×3 IMPLANT
DRSG PAD ABDOMINAL 8X10 ST (GAUZE/BANDAGES/DRESSINGS) ×6 IMPLANT
DURAPREP 26ML APPLICATOR (WOUND CARE) ×6 IMPLANT
ELECT CAUTERY BLADE 6.4 (BLADE) ×3 IMPLANT
ELECT REM PT RETURN 9FT ADLT (ELECTROSURGICAL) ×3
ELECTRODE REM PT RTRN 9FT ADLT (ELECTROSURGICAL) ×2 IMPLANT
EVACUATOR 1/8 PVC DRAIN (DRAIN) ×3 IMPLANT
FACESHIELD WRAPAROUND (MASK) ×3 IMPLANT
FACESHIELD WRAPAROUND OR TEAM (MASK) ×1 IMPLANT
FILTER STRAW FLUID ASPIR (MISCELLANEOUS) ×3 IMPLANT
GAUZE XEROFORM 1X8 LF (GAUZE/BANDAGES/DRESSINGS) ×3 IMPLANT
GLOVE BIO SURGEON STRL SZ7 (GLOVE) ×5 IMPLANT
GLOVE BIOGEL PI IND STRL 6.5 (GLOVE) ×4 IMPLANT
GLOVE BIOGEL PI IND STRL 7.0 (GLOVE) ×3 IMPLANT
GLOVE BIOGEL PI IND STRL 7.5 (GLOVE) ×2 IMPLANT
GLOVE BIOGEL PI IND STRL 8 (GLOVE) ×2 IMPLANT
GLOVE BIOGEL PI INDICATOR 6.5 (GLOVE) ×4
GLOVE BIOGEL PI INDICATOR 7.0 (GLOVE) ×2
GLOVE BIOGEL PI INDICATOR 7.5 (GLOVE) ×1
GLOVE BIOGEL PI INDICATOR 8 (GLOVE) ×1
GLOVE BIOGEL PI ORTHO PRO 7.5 (GLOVE) ×1
GLOVE BIOGEL PI ORTHO PRO SZ8 (GLOVE) ×1
GLOVE ECLIPSE 6.0 STRL STRAW (GLOVE) ×4 IMPLANT
GLOVE ECLIPSE 8.0 STRL XLNG CF (GLOVE) ×2 IMPLANT
GLOVE PI ORTHO PRO STRL 7.5 (GLOVE) ×2 IMPLANT
GLOVE PI ORTHO PRO STRL SZ8 (GLOVE) ×1 IMPLANT
GLOVE SS BIOGEL STRL SZ 7.5 (GLOVE) ×2 IMPLANT
GLOVE SUPERSENSE BIOGEL SZ 7.5 (GLOVE) ×1
GOWN STRL REUS W/ TWL LRG LVL3 (GOWN DISPOSABLE) ×6 IMPLANT
GOWN STRL REUS W/ TWL XL LVL3 (GOWN DISPOSABLE) ×6 IMPLANT
GOWN STRL REUS W/TWL LRG LVL3 (GOWN DISPOSABLE) ×9
GOWN STRL REUS W/TWL XL LVL3 (GOWN DISPOSABLE) ×9
HANDPIECE INTERPULSE COAX TIP (DISPOSABLE) ×3
HOOD PEEL AWAY FACE SHEILD DIS (HOOD) ×15 IMPLANT
IMMOBILIZER KNEE 22 UNIV (SOFTGOODS) ×2 IMPLANT
KIT BASIN OR (CUSTOM PROCEDURE TRAY) ×3 IMPLANT
KIT ROOM TURNOVER OR (KITS) ×3 IMPLANT
MANIFOLD NEPTUNE II (INSTRUMENTS) ×3 IMPLANT
NDL 18GX1X1/2 (RX/OR ONLY) (NEEDLE) ×1 IMPLANT
NDL SPNL 18GX3.5 QUINCKE PK (NEEDLE) ×1 IMPLANT
NEEDLE 18GX1X1/2 (RX/OR ONLY) (NEEDLE) ×3 IMPLANT
NEEDLE SPNL 18GX3.5 QUINCKE PK (NEEDLE) ×3 IMPLANT
NS IRRIG 1000ML POUR BTL (IV SOLUTION) ×3 IMPLANT
PACK ARTHROSCOPY DSU (CUSTOM PROCEDURE TRAY) ×3 IMPLANT
PACK TOTAL JOINT (CUSTOM PROCEDURE TRAY) ×3 IMPLANT
PAD ABD 8X10 STRL (GAUZE/BANDAGES/DRESSINGS) ×2 IMPLANT
PAD ARMBOARD 7.5X6 YLW CONV (MISCELLANEOUS) ×6 IMPLANT
PAD CAST 4YDX4 CTTN HI CHSV (CAST SUPPLIES) ×3 IMPLANT
PADDING CAST COTTON 4X4 STRL (CAST SUPPLIES) ×6
PADDING CAST COTTON 6X4 STRL (CAST SUPPLIES) ×3 IMPLANT
PENCIL BUTTON HOLSTER BLD 10FT (ELECTRODE) ×2 IMPLANT
RUBBERBAND STERILE (MISCELLANEOUS) ×3 IMPLANT
SAWBLADE OXFORD PARTIAL (BLADE) ×2 IMPLANT
SET ARTHROSCOPY TUBING (MISCELLANEOUS) ×3
SET ARTHROSCOPY TUBING LN (MISCELLANEOUS) ×2 IMPLANT
SET HNDPC FAN SPRY TIP SCT (DISPOSABLE) ×2 IMPLANT
SPONGE GAUZE 4X4 12PLY (GAUZE/BANDAGES/DRESSINGS) ×3 IMPLANT
SPONGE LAP 4X18 X RAY DECT (DISPOSABLE) ×3 IMPLANT
STRIP CLOSURE SKIN 1/2X4 (GAUZE/BANDAGES/DRESSINGS) ×3 IMPLANT
SUCTION FRAZIER TIP 10 FR DISP (SUCTIONS) ×3 IMPLANT
SUCTION FRAZIER TIP 8 FR DISP (SUCTIONS) ×1
SUCTION TUBE FRAZIER 8FR DISP (SUCTIONS) ×1 IMPLANT
SUT ETHIBOND NAB CT1 #1 30IN (SUTURE) ×6 IMPLANT
SUT ETHILON 4 0 PS 2 18 (SUTURE) ×3 IMPLANT
SUT MNCRL AB 3-0 PS2 18 (SUTURE) ×3 IMPLANT
SUT VIC AB 0 CT1 27 (SUTURE) ×6
SUT VIC AB 0 CT1 27XBRD ANBCTR (SUTURE) ×4 IMPLANT
SUT VIC AB 2-0 CT1 27 (SUTURE) ×6
SUT VIC AB 2-0 CT1 TAPERPNT 27 (SUTURE) ×4 IMPLANT
SYR 30ML SLIP (SYRINGE) ×3 IMPLANT
TOWEL OR 17X24 6PK STRL BLUE (TOWEL DISPOSABLE) ×5 IMPLANT
TOWEL OR 17X26 10 PK STRL BLUE (TOWEL DISPOSABLE) ×3 IMPLANT
TRAY FOLEY CATH 16FR SILVER (SET/KITS/TRAYS/PACK) ×3 IMPLANT
TRAY FOLEY CATH 16FRSI W/METER (SET/KITS/TRAYS/PACK) IMPLANT
TUBE CONNECTING 12X1/4 (SUCTIONS) ×3 IMPLANT
WAND HAND CNTRL MULTIVAC 90 (MISCELLANEOUS) IMPLANT
WATER STERILE IRR 1000ML POUR (IV SOLUTION) ×9 IMPLANT
WRAP KNEE MAXI GEL POST OP (GAUZE/BANDAGES/DRESSINGS) IMPLANT

## 2013-08-24 NOTE — Plan of Care (Signed)
Problem: Consults Goal: Diagnosis- Total Joint Replacement Left Unicompartmental Knee

## 2013-08-24 NOTE — Anesthesia Postprocedure Evaluation (Signed)
  Anesthesia Post-op Note  Patient: [name] [name]  Procedure(s) Performed: Procedure(s) with comments: LEFT KNEE ARTHROPLASTY UNI COMPARTMENTAL  (Left) - Left knee arthroplasty uni compartment  With left knee scope medial meniscectomy   LEFT KNEE ARTHROSCOPY WITH MEDIAL MENISCECTOMY   (Left)  Patient Location: PACU  Anesthesia Type:General and GA combined with regional for post-op pain  Level of Consciousness: awake, alert  and oriented  Airway and Oxygen Therapy: Patient Spontanous Breathing and Patient connected to nasal cannula oxygen  Post-op Pain: mild  Post-op Assessment: Post-op Vital signs reviewed, Patient's Cardiovascular Status Stable, Respiratory Function Stable, Patent Airway and NAUSEA AND VOMITING PRESENT  Post-op Vital Signs: stable  Last Vitals:  Filed Vitals:   08/24/13 1345  BP: 126/58  Pulse: 82  Temp:   Resp: 12    Complications: No apparent anesthesia complications

## 2013-08-24 NOTE — Anesthesia Preprocedure Evaluation (Addendum)
Anesthesia Evaluation   Patient awake    Reviewed: Allergy & Precautions, H&P , NPO status , Patient's Chart, lab work & pertinent test results  Airway Mallampati: II TM Distance: >3 FB Neck ROM: Full    Dental  (+) Partial Upper, Partial Lower, Caps   Pulmonary shortness of breath, pneumonia -, resolved, COPDRecent URI , former smoker,  breath sounds clear to auscultation        Cardiovascular Rhythm:Regular Rate:Normal     Neuro/Psych Anxiety Depression    GI/Hepatic   Endo/Other  Hypothyroidism   Renal/GU      Musculoskeletal   Abdominal   Peds  Hematology   Anesthesia Other Findings   Reproductive/Obstetrics                          Anesthesia Physical Anesthesia Plan  ASA: II  Anesthesia Plan: General ETT   Post-op Pain Management: MAC Combined w/ Regional for Post-op pain   Induction:   Airway Management Planned:   Additional Equipment:   Intra-op Plan:   Post-operative Plan:   Informed Consent: I have reviewed the patients History and Physical, chart, labs and discussed the procedure including the risks, benefits and alternatives for the proposed anesthesia with the patient or authorized representative who has indicated his/her understanding and acceptance.   Dental Advisory Given  Plan Discussed with: Anesthesiologist, CRNA and Surgeon  Anesthesia Plan Comments:         Anesthesia Quick Evaluation

## 2013-08-24 NOTE — Progress Notes (Signed)
Utilization review completed.  

## 2013-08-24 NOTE — Transfer of Care (Signed)
Immediate Anesthesia Transfer of Care Note  Patient: [name] [name]  Procedure(s) Performed: Procedure(s) with comments: LEFT KNEE ARTHROPLASTY UNI COMPARTMENTAL  (Left) - Left knee arthroplasty uni compartment  With left knee scope medial meniscectomy   LEFT KNEE ARTHROSCOPY WITH MEDIAL MENISCECTOMY   (Left)  Patient Location: PACU  Anesthesia Type:General and Regional  Level of Consciousness: awake, alert , oriented and sedated  Airway & Oxygen Therapy: Patient Spontanous Breathing and Patient connected to nasal cannula oxygen  Post-op Assessment: Report given to PACU RN, Post -op Vital signs reviewed and stable and Patient moving all extremities  Post vital signs: Reviewed and stable  Complications: No apparent anesthesia complications

## 2013-08-24 NOTE — Interval H&P Note (Signed)
History and Physical Interval Note:  08/24/2013 7:03 AM  Chestine Sporeachel W Hilbun  has presented today for surgery, with the diagnosis of left knee DJD  The various methods of treatment have been discussed with the patient and family. After consideration of risks, benefits and other options for treatment, the patient has consented to  Procedure(s) with comments: LEFT KNEE ARTHROPLASTY UNI COMPARTMENTAL  (Left) - Left knee arthroplasty uni compartment  With left knee scope medial meniscectomy vs left total knee replacement  LEFT KNEE ARTHROSCOPY WITH MEDIAL MENISCECTOMY   (Left) VERSES LEFT TOTAL KNEE REPLACEMENT    (Left) as a surgical intervention .  The patient's history has been reviewed, patient examined, no change in status, stable for surgery.  I have reviewed the patient's chart and labs.  Questions were answered to the patient's satisfaction.     Nilda Simmerobert A Edel Rivero

## 2013-08-24 NOTE — Progress Notes (Signed)
Orthopedic Tech Progress Note Patient Details:  [name] [name] 05-08-1945 [id number]  Patient ID: [name] [name], female   DOB: 05-08-1945, 68 y.o.   MRN: [id number] Placed pt's lle in cpm@ 0-60 degrees @2025   Malakhai Beitler 08/24/2013, 8:27 PM

## 2013-08-24 NOTE — Anesthesia Procedure Notes (Signed)
Anesthesia Regional Block:  Adductor canal block  Pre-Anesthetic Checklist: ,, timeout performed, Correct Patient, Correct Site, Correct Laterality, Correct Procedure, Correct Position, site marked, Risks and benefits discussed,  Surgical consent,  Pre-op evaluation,  At surgeon's request and post-op pain management  Laterality: Left  Prep: chloraprep       Needles:  Injection technique: Single-shot  Needle Type: Echogenic Stimulator Needle     Needle Length: 9cm 9 cm Needle Gauge: 22 and 22 G    Additional Needles:  Procedures: ultrasound guided (picture in chart) Adductor canal block Narrative:  Start time: 08/24/2013 7:00 AM End time: 08/24/2013 7:15 AM Injection made incrementally with aspirations every 5 mL.  Performed by: Personally   Additional Notes: 20 cc 0.5% marcaine with 1:200 Epi injected easily

## 2013-08-24 NOTE — Progress Notes (Signed)
PT Cancellation Note  Patient Details Name: [name] [name] MRN: [id number] DOB: [birth year]   Cancelled Treatment:    Reason Eval/Treat Not Completed: Medical issues which prohibited therapy (pain and nausea)   Jobe IgoKaren [facility] 08/24/2013, 5:14 PM [name] PT [phone]

## 2013-08-24 NOTE — Progress Notes (Signed)
Orthopedic Tech Progress Note Patient Details:  [name] SporeRachel W [name] 1945-06-23 [id number] CPM applied to LLE with appropriate settings. OHF applied to bed. Footsie roll provided. CPM Left Knee CPM Left Knee: On Left Knee Flexion (Degrees): 60 Left Knee Extension (Degrees): 0   [name] 08/24/2013, 11:01 AM

## 2013-08-24 NOTE — H&P (View-Only) (Signed)
TOTAL KNEE ADMISSION H&P  Patient is being admitted for left knee arthroscopy with uni vs total knee arthroplasty.  Subjective:  Chief Complaint:left knee pain.  HPI: [name] [name], 68 y.o. female, has a history of pain and functional disability in the left knee due to arthritis and has failed non-surgical conservative treatments for greater than 12 weeks to includeNSAID's and/or analgesics, corticosteriod injections, viscosupplementation injections, flexibility and strengthening excercises, supervised PT with diminished ADL's post treatment, use of assistive devices, weight reduction as appropriate and activity modification.  Onset of symptoms was gradual, starting 10 years ago with gradually worsening course since that time. The patient noted no past surgery on the left knee(s).  Patient currently rates pain in the left knee(s) at 10 out of 10 with activity. Patient has night pain, worsening of pain with activity and weight bearing, pain that interferes with activities of daily living, crepitus and joint swelling.  Patient has evidence of subchondral sclerosis, periarticular osteophytes and joint space narrowing by imaging studies.  There is no active infection.  Patient Active Problem List   Diagnosis Date Noted  . Hypothyroidism   . Restless leg syndrome   . Depression   . Anxiety   . COPD (chronic obstructive pulmonary disease)    Past Medical History  Diagnosis Date  . Anxiety   . Depression   . History of pneumonia 2010    in ICU  . Restless leg syndrome   . Hypothyroidism   . Left knee DJD   . COPD (chronic obstructive pulmonary disease)     Past Surgical History  Procedure Laterality Date  . Total vaginal hysterectomy       (Not in a hospital admission) No Known Allergies   Outpatient Encounter Prescriptions as of 08/12/2013  Medication Sig  . ALPRAZolam (XANAX) 0.5 MG tablet Take 0.5 mg by mouth at bedtime as needed for anxiety.  Marland Kitchen. amitriptyline (ELAVIL) 75 MG tablet  Take 75 mg by mouth at bedtime.  Marland Kitchen. levothyroxine (SYNTHROID, LEVOTHROID) 100 MCG tablet Take 100 mcg by mouth daily before breakfast.  . meloxicam (MOBIC) 15 MG tablet Take 15 mg by mouth daily.  . vitamin E 200 UNIT capsule Take 200 Units by mouth daily.   History  Substance Use Topics  . Smoking status: Former Smoker -- 1.50 packs/day for 30 years    Types: Cigarettes    Quit date: 04/10/2003  . Smokeless tobacco: Not on file  . Alcohol Use: No    Family History  Problem Relation Age of Onset  . Stroke Mother   . Heart disease Father   . Heart attack Father   . Hypertension Father      Review of Systems  Constitutional: Negative.   Eyes: Negative.   Respiratory: Negative.   Cardiovascular: Negative.   Gastrointestinal: Negative.   Genitourinary: Negative.   Musculoskeletal: Positive for joint pain.  Skin: Negative.   Neurological: Negative.   Endo/Heme/Allergies: Negative.   Psychiatric/Behavioral: Negative.     Objective:  Physical Exam  Constitutional: She is oriented to person, place, and time. She appears well-developed and well-nourished.  HENT:  Head: Normocephalic and atraumatic.  Mouth/Throat: Oropharynx is clear and moist.  Eyes: Conjunctivae are normal. Pupils are equal, round, and reactive to light.  Neck: Neck supple.  Cardiovascular: Normal rate, regular rhythm and normal heart sounds.   Respiratory: Effort normal and breath sounds normal.  GI: Soft.  Genitourinary:  Not pertinent to current symptomatology therefore not examined.  Musculoskeletal:  Well-developed well-nourished  white female in no acute distress. Alert and oriented. Examination of her left knee reveals pain medially, mild varus deformity 1+ crepitation 1+ synovitis, range of motion is -5 to 125 degrees knee is stable with diffuse pain and normal patella tracking. Exam of the right knee reveals full range of motion without pain swelling weakness or instability. Vascular exam: pulses 2+  and symmetric.  Neurological: She is alert and oriented to person, place, and time.  Skin: Skin is warm and dry.  Psychiatric: She has a normal mood and affect.    Vital signs in last 24 hours: Last recorded: 05/06 1300   BP: 130/74 Pulse: 96  Temp: 98.3 F (36.8 C)    Height: 5' (1.524 m) SpO2: 97  Weight: 62.143 kg (137 lb)     Labs:   Estimated body mass index is 26.76 kg/(m^2) as calculated from the following:   Height as of this encounter: 5' (1.524 m).   Weight as of this encounter: 62.143 kg (137 lb).   Imaging Review Plain radiographs demonstrate severe degenerative joint disease of the left knee(s). The overall alignment ismild varus. The bone quality appears to be good for age and reported activity level.  Assessment/Plan:  End stage arthritis, left knee Patient Active Problem List   Diagnosis Date Noted  . Hypothyroidism   . Restless leg syndrome   . Depression   . Anxiety   . COPD (chronic obstructive pulmonary disease)      The patient history, physical examination, clinical judgment of the provider and imaging studies are consistent with end stage degenerative joint disease of the left knee(s) and total knee arthroplasty is deemed medically necessary. The treatment options including medical management, injection therapy arthroscopy and arthroplasty were discussed at length. The risks and benefits of total knee arthroplasty were presented and reviewed. The risks due to aseptic loosening, infection, stiffness, patella tracking problems, thromboembolic complications and other imponderables were discussed. The patient acknowledged the explanation, agreed to proceed with the plan and consent was signed. Patient is being admitted for inpatient treatment for surgery, pain control, PT, OT, prophylactic antibiotics, VTE prophylaxis, progressive ambulation and ADL's and discharge planning. The patient is planning to be discharged home with home health services Brynden Thune A.  Gwinda PasseShepperson, PA-C Physician Assistant [facility] [phone]  08/12/2013, 2:21 PM

## 2013-08-25 ENCOUNTER — Encounter (HOSPITAL_COMMUNITY): Payer: Self-pay | Admitting: General Practice

## 2013-08-25 LAB — BASIC METABOLIC PANEL
BUN: 9 mg/dL (ref 6–23)
CALCIUM: 9.2 mg/dL (ref 8.4–10.5)
CO2: 24 mEq/L (ref 19–32)
CREATININE: 0.67 mg/dL (ref 0.50–1.10)
Chloride: 107 mEq/L (ref 96–112)
GFR calc Af Amer: >90 mL/min (ref >90)
Glucose, Bld: 161 mg/dL — ABNORMAL HIGH (ref 70–99)
Potassium: 5.3 mEq/L (ref 3.7–5.3)
SODIUM: 141 meq/L (ref 137–147)

## 2013-08-25 LAB — CBC
HCT: 36.1 % (ref 36.0–46.0)
Hemoglobin: 11.5 g/dL — ABNORMAL LOW (ref 12.0–15.0)
MCH: 27.1 pg (ref 26.0–34.0)
MCHC: 31.9 g/dL (ref 30.0–36.0)
MCV: 84.9 fL (ref 78.0–100.0)
PLATELETS: 269 10*3/uL (ref 150–400)
RBC: 4.25 MIL/uL (ref 3.87–5.11)
RDW: 14.3 % (ref 11.5–15.5)
WBC: 21.5 10*3/uL — ABNORMAL HIGH (ref 4.0–10.5)

## 2013-08-25 MED ORDER — MUPIROCIN 2 % EX OINT: 1.0000 "application " | TOPICAL_OINTMENT | Freq: Two times a day (BID) | CUTANEOUS | Status: DC

## 2013-08-25 MED ORDER — DSS 100 MG PO CAPS: ORAL_CAPSULE | ORAL | Status: DC

## 2013-08-25 MED ORDER — CELECOXIB 200 MG PO CAPS: ORAL_CAPSULE | ORAL | Status: DC

## 2013-08-25 MED ORDER — ACETAMINOPHEN 325 MG PO TABS: 650.0000 mg | ORAL_TABLET | Freq: Four times a day (QID) | ORAL | Status: DC | PRN

## 2013-08-25 MED ORDER — OXYCODONE HCL 5 MG PO TABS: ORAL_TABLET | ORAL | Status: DC

## 2013-08-25 MED ORDER — RIVAROXABAN 10 MG PO TABS: 10.0000 mg | ORAL_TABLET | Freq: Every day | ORAL | Status: DC

## 2013-08-25 MED ORDER — BISACODYL 5 MG PO TBEC: DELAYED_RELEASE_TABLET | ORAL | Status: DC

## 2013-08-25 NOTE — Progress Notes (Signed)
Agree with SPT.    Lunden Stieber, PT [phone]  

## 2013-08-25 NOTE — Evaluation (Signed)
Physical Therapy Evaluation Patient Details Name: [name] [name] MRN: [id number] DOB: October 21, 1945 Today's Date: 08/25/2013   History of Present Illness  68 y.o. female s/p L uni knee arthroplasty, MRSA carrier  Clinical Impression  Pt expressed light-headedness and nausea throughout therapy, PA was present and aware of symptoms.  Pt required VC for sequencing and safety throughout bed mobility, transfers, and ambulation with RW.  Pt demonstrated poor sitting balance and required min-A to prevent LOB to her left.  Upon standing and with ambulation, pt tended to lean posteriorly and required tactile cueing to prevent posterior LOB.  Pt was able to ambulate 20 ft with RW and cueing for safe use of RW before needing to return to room due to nausea and light-headedness.  Pt will benefit from continued PT to improve safety with transfers, ambulation, and stairs for entry to pt's home.  Will continue to follow.    Follow Up Recommendations Home health PT;Supervision/Assistance - 24 hour    Equipment Recommendations  Rolling walker with 5" wheels;3in1 (PT)    Recommendations for Other Services       Precautions / Restrictions Precautions Precautions: Knee Precaution Booklet Issued: No Precaution Comments: Discussed knee precautions, pt overwhelmed so plan to provide booklet during next therapy visit Restrictions Weight Bearing Restrictions: Yes LLE Weight Bearing: Weight bearing as tolerated      Mobility  Bed Mobility Overal bed mobility: Needs Assistance Bed Mobility: Supine to Sit     Supine to sit: Min guard     General bed mobility comments: Pt able to come supine to sit with no physical assistance but required min guard upon sitting due to dizziness  Transfers Overall transfer level: Needs assistance Equipment used: Rolling walker (2 wheeled) Transfers: Sit to/from Stand Sit to Stand: Min assist         General transfer comment: Pt required repeated VC for safe hand  placement and safe use of RW, as well as VC for sequencing.  Min-A for tactile cueing to improve balance upon standing.  Ambulation/Gait Ambulation/Gait assistance: Min assist Ambulation Distance (Feet): 30 Feet Assistive device: Rolling walker (2 wheeled) Gait Pattern/deviations: Step-to pattern;Decreased stance time - left;Decreased step length - right;Decreased weight shift to left;Leaning posteriorly Gait velocity: decreased Gait velocity interpretation: Below normal speed for age/gender General Gait Details: Distance limited by light-headedness and nausea.  Pt required min-A to maintain standing balance due to tendency to lean posteriorly and to guard for safety due to LE weakness.  VC for safe use of RW required.    Stairs            Wheelchair Mobility    Modified Rankin (Stroke Patients Only)       Balance Overall balance assessment: Needs assistance Sitting-balance support: Bilateral upper extremity supported;Feet supported Sitting balance-Leahy Scale: Poor Sitting balance - Comments: Pt had one LOB to L and posteriorly upon assuming sitting postion at EOB.  Required min-A to regain upright trunk position.  Otherwise able to maintain sitting balance with bil. UE support.   Standing balance support: Bilateral upper extremity supported;During functional activity Standing balance-Leahy Scale: Poor Standing balance comment: Pt required bil. UE support on RW and occassional tactile cueing to prevent posterior LOB.                             Pertinent Vitals/Pain Pt reported brief increases in pain with sit to/from stand transfers.  Pt premedicated.    Home Living  Family/patient expects to be discharged to:: Private residence Living Arrangements: Spouse/significant other Available Help at Discharge: Family;Available 24 hours/day Type of Home: House Home Access: Stairs to enter Entrance Stairs-Rails: None Entrance Stairs-Number of Steps: 1 Home Layout:  One level Home Equipment: None Additional Comments: Pt's daughter will be staying with pt and her husband for next 7 days, 24 hrs/day    Prior Function Level of Independence: Independent               Hand Dominance        Extremity/Trunk Assessment   Upper Extremity Assessment: Overall WFL for tasks assessed           Lower Extremity Assessment: LLE deficits/detail   LLE Deficits / Details: increased pain and decreased strength post TKA     Communication   Communication: No difficulties  Cognition Arousal/Alertness: Awake/alert Behavior During Therapy: WFL for tasks assessed/performed Overall Cognitive Status: Within Functional Limits for tasks assessed                      General Comments      Exercises        Assessment/Plan    PT Assessment Patient needs continued PT services  PT Diagnosis Abnormality of gait;Acute pain   PT Problem List Decreased strength;Decreased range of motion;Decreased activity tolerance;Decreased balance;Decreased mobility;Decreased knowledge of use of DME;Decreased knowledge of precautions;Pain  PT Treatment Interventions DME instruction;Gait training;Stair training;Functional mobility training;Therapeutic activities;Therapeutic exercise;Balance training;Patient/family education   PT Goals (Current goals can be found in the Care Plan section) Acute Rehab PT Goals Patient Stated Goal: none stated PT Goal Formulation: With patient/family Time For Goal Achievement: 09/01/13 Potential to Achieve Goals: Good    Frequency 7X/week   Barriers to discharge        Co-evaluation               End of Session Equipment Utilized During Treatment: Gait belt Activity Tolerance: Patient limited by fatigue;Other (comment) (nausea) Patient left: in chair;with call bell/phone within reach;with family/visitor present Nurse Communication: Mobility status         Time: [id number] PT Time Calculation (min): 28  min   Charges:         PT G Codes:          [name], SPT 08/25/2013, 9:29 AM

## 2013-08-25 NOTE — Care Management Note (Signed)
CARE MANAGEMENT NOTE 08/25/2013  Patient:  [name] [name],[name] [name]   Account Number:  [id number]  Date Initiated:  08/25/2013  Documentation initiated by:  Vance PeperBRADY,Stephan Draughn  Subjective/Objective Assessment:   68 yr old female s/p left total knee arthroplasty.     Action/Plan:   Case manager spoke with patient oncerning home health and DME needs at discharge. Choice offered. [facility] Patient has family support at discharge..   Anticipated DC Date:  08/25/2013   Anticipated DC Plan:  [name]      DC Planning Services  CM consult      [facility]  DURABLE MEDICAL EQUIPMENT   Choice offered to / List presented to:  C-1 Patient   DME arranged  3-N-1  [facility]  [facility]      DME agency  [employer]  [facility]     HH arranged  [facility]   Status of service:  Completed, signed off Medicare Important Message given?  NA - LOS <3 / Initial given by admissions (If response is "NO", the following Medicare IM given date fields will be blank) Date Medicare IM given:   Date Additional Medicare IM given:    Discharge Disposition:  [name]  Per UR Regulation:    If discussed at Long Length of Stay Meetings, dates discussed:    Comments:

## 2013-08-25 NOTE — Progress Notes (Signed)
Physical Therapy Treatment Patient Details Name: [name] SporeRachel W [name] MRN: [id number] DOB: 1945-09-15 Today's Date: 08/25/2013    History of Present Illness 68 y.o. female s/p L uni knee arthroplasty, MRSA carrier    PT Comments    Pt ambulated ~350 ft with RW and min guard for VC for sequencing and use of RW.  Pt was also able to practice ascending/descending 2 stairs with RW and husband min-A x3 before returning to chair for seated rest.  Pt continues to require VC for sequencing and safety with sit to/from stand transfers using RW.  Knee precaution booklet provided with discussion of precautions with patient and her husband.  Will continue to follow.  Follow Up Recommendations  Home health PT;Supervision/Assistance - 24 hour     Equipment Recommendations  Rolling walker with 5" wheels;3in1 (PT)    Recommendations for Other Services       Precautions / Restrictions Precautions Precautions: Knee Precaution Booklet Issued: Yes (comment) Precaution Comments: Provided booklet with discussion of knee precautions with patient and her husband Required Braces or Orthoses: Knee Immobilizer - Left Knee Immobilizer - Left: On when out of bed or walking;Discontinue once straight leg raise with < 10 degree lag Restrictions Weight Bearing Restrictions: Yes LLE Weight Bearing: Weight bearing as tolerated    Mobility  Bed Mobility               General bed mobility comments: Pt sitting in chair upon PT arrival  Transfers Overall transfer level: Needs assistance Equipment used: Rolling walker (2 wheeled) Transfers: Sit to/from Stand Sit to Stand: Min guard         General transfer comment: Min guard provided for moderate VC for hand placement and safety with RW.  Ambulation/Gait Ambulation/Gait assistance: Min guard Ambulation Distance (Feet): 350 Feet Assistive device: Rolling walker (2 wheeled) Gait Pattern/deviations: Step-through pattern;Decreased stance time -  left;Decreased step length - right;Decreased weight shift to left Gait velocity: decreased Gait velocity interpretation: Below normal speed for age/gender General Gait Details: Pt ambulated 350 ft with min guard and no rest breaks.  Initical VC for sequencing of gait with RW.   Stairs Stairs: Yes Stairs assistance: Min assist Stair Management: No rails;Backwards;Forwards;With walker Number of Stairs: 2 (practiced ascending/descending 2 stairs a total of 3 times.) General stair comments: Pt and husband practiced stair ascent/descent with RW.  VC provided for walker placement and sequencing.  Wheelchair Mobility    Modified Rankin (Stroke Patients Only)       Balance Overall balance assessment: Needs assistance Sitting-balance support: No upper extremity supported;Feet unsupported Sitting balance-Leahy Scale: Fair Sitting balance - Comments: Pt able to sit at edge of chair without her feet or back supported, but would tire quickly and need to use UE or lean back into chair.   Standing balance support: Bilateral upper extremity supported Standing balance-Leahy Scale: Poor Standing balance comment: Standing balance improved from this AM, but pt continues to rely on bil. UE support on RW to maintain standing balance.  Standing balance is improved with use of L TKA.                    Cognition Arousal/Alertness: Awake/alert Behavior During Therapy: WFL for tasks assessed/performed Overall Cognitive Status: Within Functional Limits for tasks assessed                      Exercises Total Joint Exercises Ankle Circles/Pumps: AROM;Both;10 reps;Seated Quad Sets: AROM;Left;10 reps;Seated    General Comments  Pertinent Vitals/Pain Pt reports that her pain is much better this PM.  Pt premedicated.    Home Living Family/patient expects to be discharged to:: Private residence Living Arrangements: Spouse/significant other                  Prior  Function            PT Goals (current goals can now be found in the care plan section) Acute Rehab PT Goals Patient Stated Goal: none stated PT Goal Formulation: With patient/family Time For Goal Achievement: 09/01/13 Potential to Achieve Goals: Good Progress towards PT goals: Progressing toward goals    Frequency  7X/week    PT Plan Current plan remains appropriate    Co-evaluation             End of Session Equipment Utilized During Treatment: Gait belt Activity Tolerance: Patient tolerated treatment well Patient left: in chair;with call bell/phone within reach;with family/visitor present     Time: [id number] PT Time Calculation (min): 40 min  Charges:                       G Codes:      Kendry Pfarr, SPT 08/25/2013, 2:20 PM

## 2013-08-25 NOTE — Evaluation (Signed)
Agree with SPT.    Abhi Moccia, PT [phone]  

## 2013-08-25 NOTE — Op Note (Signed)
NAMRoddie Mc:  [name], [name] [name]                ACCOUNT NO.:  [id number]  MEDICAL RECORD NO.:  [id number]  LOCATION:  [id number]                        FACILITY:  [facility]  PHYSICIAN:  Idus Rathke A. Thurston [name], M.D. DATE OF BIRTH:  1946/01/10  DATE OF PROCEDURE:  08/24/2013 DATE OF DISCHARGE:                              OPERATIVE REPORT   PREOPERATIVE DIAGNOSIS: 1. Left knee medial compartment, degenerative joint disease. 2. Left knee lateral meniscus tear.  POSTOPERATIVE DIAGNOSIS: 1. Left knee medial compartment, degenerative joint disease. 2. Left knee lateral meniscus tear.  PROCEDURE: 1. Left knee unicompartmental arthroplasty using Oxford system with     number small cemented femur size A, cemented tibia with #3     polyethylene insert. 2. Tobramycin impregnated cement. 3. Left knee arthroscopy with partial lateral meniscectomy.  SURGEON:  Elana Almobert A. Thurston [name], M.D.  ASSISTANT: 1. Eulas PostJoshua P Landau, MD. 2. Kirstin Shepperson, PA-C.  ANESTHESIA:  General.  OPERATIVE TIME:  1 hour and 40 minutes.  COMPLICATIONS:  None.  DESCRIPTION OF PROCEDURE:  [name] [name] [name] was brought to the operating room on Aug 24, 2013, after a femoral nerve block was placed in the holding room by anesthesia.  She was placed on the operating table in supine position.  She received antibiotics preoperatively for prophylaxis. After being placed under general anesthesia, she had a Foley catheter placed under sterile conditions.  Her left knee was examined.  Range of motion from -5 to 125 degrees, mild varus deformity, knee stable, ligamentous exam with normal patellar tracking.  Left leg was prepped using sterile DuraPrep and draped using sterile technique.  Time-out procedure was called the correct left knee identified.  Left leg was exsanguinated and a thigh tourniquet elevated to 365 mm.  Initially, the arthroscopy was performed because of the lateral meniscus tear noted on preoperative MRI.  Through an anterolateral portal,  the arthroscope with a pump attached was placed into an anteromedial portal, and arthroscopic probe was placed.  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  Intercondylar notch was inspected.  The anterior and posterior cruciate ligaments were normal.  Lateral compartment showed 25% grade 3 chondromalacia and lateral tibial plateau.  The lateral femoral condyle was intact.  Lateral meniscus showed a tear of the posterior and lateral horn of which 30-40% was resected back to a stable rim.  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  At this point, she felt that all the arthroscopic procedure had been satisfactorily addressed.  The instruments were then removed.  At this point, it was felt that she was an excellent candidate for unicompartmental arthroplasty.  A 5-cm longitudinal incision was made just medial to the patella in the patellar tendon.  Underlying subcutaneous tissues were incised along with skin incision.  A median arthrotomy was performed, and a portion of the fat pad was removed. Again the medial compartment was well visualized and found to have significant grade 4 DJD.  A small sizing spoon was then placed in the medial compartment, and this was used as a stylus while the tibial cutting jig was placed and attached to it with a  G-clamp.  The proximal tibial cuts were then made in excellent position resecting 7 mm off the proximal tibia based on the sizing spoon.  After this was done, the proximal tibial fragment was removed and found to be a size A based on sizing.  At this point, the medial meniscus was removed.  At this point, the intramedullary rod was placed up the femoral canal and used with a femoral cutting jig, which was linked to this rod and two drill holes were made on the central portion of the distal femur and then the 0 spigot was placed in the larger one of  these 2 holes and the original milling was carried out.  After this was done, then with the size A tibial component in place and a small femur, a #3 panel was placed in flexion and found to give excellent position and excellent stability and full extension in 20 degrees of flexion.  There was no space noted and thus a #3 spigot was placed back in the distal femur and was remilled. After this was done, the trials were then placed and again, a #3 panel was placed both in flexion and extension and found to get excellent position and size and stability.  At this point, the tibial keel cut was made with the toothbrush saw and then the impingement cuts were made in the distal femur.  After this was done, then the trial components were replaced and a #3 polyethylene spacer was placed and knee to taken through a full range of motion and found to be stable with excellent sizing noted.  At this point all the trial components were then removed. The knee was gently lavaged, irrigated, and then the size A tibial component with cement backing was hammered in position with an excellent fit with excess cement being removed from around the edges.  The small cement backed femoral component was hammered into position also with an excellent fit with excess cement being removed from around the edges. The #3 polyethylene spacer was placed and then the knee was brought through a full range of motion and found to be stable with excellent excursion of the polyethylene spacer and excellent gliding.  At this point, after the cement was hardened the wound was further irrigated with saline.  Intraoperative fluoroscopy confirmed excellent position of the components.  At this point, then the arthrotomy was closed with #1 Ethibond running suture over 1 medium Hemovac drains.  Subcutaneous tissues were closed 2-0 Vicryl, subcuticular layer closed with 4-0 Monocryl.  Arthroscopic portals closed with 3-0 nylon.   Sterile dressings were applied and a long-leg splint.  Tourniquet was released. The patient awakened and taken to recovery in stable condition.  Needle, sponge counts correct x2 at the end the case.     [name] [name] [name], M.D.     RAW/MEDQ  D:  08/24/2013  T:  08/25/2013  Job:  [id number]

## 2013-08-25 NOTE — Discharge Summary (Signed)
Patient ID: [name] SporeRachel W Denbleyker MRN: [id number] DOB/AGE: January 26, 1946 68 y.o.  Admit date: 08/24/2013 Discharge date: 08/25/2013  Admission Diagnoses:  Principal Problem:   Left knee DJD Active Problems:   Hypothyroidism   Restless leg syndrome   Depression   Anxiety   COPD (chronic obstructive pulmonary disease)   DJD (degenerative joint disease) of knee   Discharge Diagnoses:  Same  Past Medical History  Diagnosis Date  . Anxiety   . Depression   . History of pneumonia 2010    in ICU  . Restless leg syndrome   . Hypothyroidism   . Left knee DJD   . COPD (chronic obstructive pulmonary disease)   . Shortness of breath     exersion,occ at rest  . History of recurrent UTIs   . Blood in the urine     had "problem with blood in urine as child 89yrs -out of school 1 yr no dx have hematura at time as adult  . Pneumonia     2010 on ventalator    Surgeries: Procedure(s): LEFT KNEE ARTHROPLASTY UNI COMPARTMENTAL  LEFT KNEE ARTHROSCOPY WITH MEDIAL MENISCECTOMY   on 08/24/2013   Consultants:    Discharged Condition: Improved  Hospital Course: [name] [name] is an 68 y.o. female who was admitted 08/24/2013 for operative treatment ofLeft knee DJD. Patient has severe unremitting pain that affects sleep, daily activities, and work/hobbies. After pre-op clearance the patient was taken to the operating room on 08/24/2013 and underwent  Procedure(s): LEFT KNEE ARTHROPLASTY UNI COMPARTMENTAL  LEFT KNEE ARTHROSCOPY WITH MEDIAL MENISCECTOMY  .    Patient was given perioperative antibiotics:     Anti-infectives   Start     Dose/Rate Route Frequency Ordered Stop   08/24/13 2000  vancomycin (VANCOCIN) IVPB 1000 mg/200 mL premix     1,000 mg 200 mL/hr over 60 Minutes Intravenous Every 12 hours 08/24/13 1435 08/24/13 2321   08/24/13 1500  ceFAZolin (ANCEF) IVPB 1 g/50 mL premix     1 g 100 mL/hr over 30 Minutes Intravenous 4 times per day 08/24/13 1435 08/25/13 0043   08/24/13 0824   tobramycin (NEBCIN) powder  Status:  Discontinued       As needed 08/24/13 0825 08/24/13 0948   08/24/13 0745  vancomycin (VANCOCIN) IVPB 1000 mg/200 mL premix     1,000 mg 200 mL/hr over 60 Minutes Intravenous To Surgery 08/24/13 0737 08/25/13 0745   08/23/13 1036  ceFAZolin (ANCEF) IVPB 2 g/50 mL premix  Status:  Discontinued     2 g 100 mL/hr over 30 Minutes Intravenous On call to O.R. 08/23/13 1036 08/24/13 1435       Patient was given sequential compression devices, early ambulation, and chemoprophylaxis to prevent DVT.  Patient benefited maximally from hospital stay and there were no complications.    Recent vital signs:  Patient Vitals for the past 24 hrs:  BP Temp Temp src Pulse Resp SpO2  08/25/13 0515 114/48 mmHg 97.7 F (36.5 C) Oral 105 16 96 %  08/25/13 0015 139/60 mmHg 97.9 F (36.6 C) Oral 103 16 97 %  08/24/13 2000 141/64 mmHg 98.4 F (36.9 C) Oral 104 16 94 %  08/24/13 1453 127/56 mmHg 97.4 F (36.3 C) Axillary 83 14 97 %  08/24/13 1415 131/62 mmHg - - 87 14 100 %  08/24/13 1400 122/62 mmHg - - 82 13 99 %  08/24/13 1345 126/58 mmHg - - 82 12 99 %  08/24/13 1330 131/60 mmHg - - 77  12 98 %  08/24/13 1315 131/66 mmHg - - 90 12 99 %  08/24/13 1300 129/62 mmHg - - 90 12 99 %  08/24/13 1245 128/66 mmHg - - 90 11 99 %  08/24/13 1230 127/61 mmHg 97.6 F (36.4 C) - 91 12 97 %  08/24/13 1215 133/61 mmHg - - 88 13 97 %  08/24/13 1200 126/66 mmHg - - 82 12 93 %  08/24/13 1145 125/58 mmHg - - 81 15 100 %  08/24/13 1130 122/61 mmHg - - 75 14 100 %  08/24/13 1115 125/58 mmHg - - 77 16 100 %  08/24/13 1100 130/63 mmHg - - 84 17 97 %  08/24/13 1045 142/59 mmHg - - 81 15 98 %  08/24/13 1030 139/64 mmHg - - 86 15 91 %  08/24/13 1015 139/63 mmHg - - 87 29 98 %  08/24/13 1000 128/60 mmHg 97.8 F (36.6 C) - 88 15 98 %     Recent laboratory studies:   Recent Labs  08/25/13 0513  WBC 21.5*  HGB 11.5*  HCT 36.1  PLT 269  NA 141  K 5.3  CL 107  CO2 24  BUN 9   CREATININE 0.67  GLUCOSE 161*  CALCIUM 9.2     Discharge Medications:     Medication List    STOP taking these medications       meloxicam 15 MG tablet  Commonly known as:  MOBIC      TAKE these medications       acetaminophen 325 MG tablet  Commonly known as:  TYLENOL  Take 2 tablets (650 mg total) by mouth every 6 (six) hours as needed for mild pain (or Fever >/= 101).     albuterol 108 (90 BASE) MCG/ACT inhaler  Commonly known as:  PROVENTIL HFA;VENTOLIN HFA  Inhale 1 puff into the lungs every 6 (six) hours as needed for wheezing or shortness of breath.     ALPRAZolam 0.5 MG tablet  Commonly known as:  XANAX  Take 0.5 mg by mouth at bedtime as needed for anxiety.     amitriptyline 75 MG tablet  Commonly known as:  ELAVIL  Take 75 mg by mouth at bedtime.     bisacodyl 5 MG EC tablet  Commonly known as:  DULCOLAX  Take 2 tablets every night with dinner until bowel movement.  LAXITIVE.  Restart if two days since last bowel movement     celecoxib 200 MG capsule  Commonly known as:  CELEBREX  1 tab po q day with food for pain and  swelling     DSS 100 MG Caps  1 tab 2 times a day while on narcotics.  STOOL SOFTENER     levothyroxine 100 MCG tablet  Commonly known as:  SYNTHROID, LEVOTHROID  Take 100 mcg by mouth daily before breakfast.     mupirocin ointment 2 %  Commonly known as:  BACTROBAN  Place 1 application into the nose 2 (two) times daily.     oxyCODONE 5 MG immediate release tablet  Commonly known as:  Oxy IR/ROXICODONE  1-2 tablets every 4-6 hrs as needed for pain     rivaroxaban 10 MG Tabs tablet  Commonly known as:  XARELTO  Take 1 tablet (10 mg total) by mouth daily with breakfast.     Vitamin D 2000 UNITS Caps  Take 2,000 Units by mouth daily.        Diagnostic Studies: Dg Chest 2 View  08/14/2013  CLINICAL DATA:  Osteoarthritis of left knee. Pre operative respiratory exam.  EXAM: CHEST  2 VIEW  COMPARISON:  10/18/2010  FINDINGS: The  heart size and mediastinal contours are within normal limits. Both lungs are clear. Prominent osteophytes on the left humeral head. No acute osseous abnormality.  IMPRESSION: No acute abnormalities.   Electronically Signed   By: Geanie Cooley M.D.   On: 08/14/2013 13:42    Disposition: Final discharge disposition not confirmed  Discharge Instructions   CPM    Complete by:  As directed   Continuous passive motion machine (CPM):      Use the CPM from 0 to 90 for 6 hours per day.       You may break it up into 2 or 3 sessions per day.      Use CPM for 2 weeks or until you are told to stop.     Call MD / Call 911    Complete by:  As directed   If you experience chest pain or shortness of breath, CALL 911 and be transported to the hospital emergency room.  If you develope a fever above 101 F, pus (white drainage) or increased drainage or redness at the wound, or calf pain, call your surgeon's office.     Change dressing    Complete by:  As directed   Change the dressing daily with sterile 4 x 4 inch gauze dressing and apply TED hose.  You may clean the incision with alcohol prior to redressing.     Constipation Prevention    Complete by:  As directed   Drink plenty of fluids.  Prune juice may be helpful.  You may use a stool softener, such as Colace (over the counter) 100 mg twice a day.  Use MiraLax (over the counter) for constipation as needed.     Diet - low sodium heart healthy    Complete by:  As directed      Discharge instructions    Complete by:  As directed   Total Knee Replacement Care After Refer to this sheet in the next few weeks. These discharge instructions provide you with general information on caring for yourself after you leave the hospital. Your caregiver may also give you specific instructions. Your treatment has been planned according to the most current medical practices available, but unavoidable complications sometimes occur. If you have any problems or questions after  discharge, please call your caregiver. Regaining a near full range of motion of your knee within the first 3 to 6 weeks after surgery is critical. HOME CARE INSTRUCTIONS  You may resume a normal diet and activities as directed.  Perform exercises as directed.  Place gray foam block, curve side up under heel at all times except when in CPM or when walking.  DO NOT modify, tear, cut, or change in any way the gray foam block. You will receive physical therapy daily  Take showers instead of baths until informed otherwise.  You may shower on Sunday.  Please wash whole leg including wound with soap and water  Change bandages (dressings)daily It is OK to take over-the-counter tylenol in addition to the oxycodone for pain, discomfort, or fever. Oxycodone is VERY constipating.  Please take stool softener twice a day and laxatives daily until bowels are regular Eat a well-balanced diet.  Avoid lifting or driving until you are instructed otherwise.  Make an appointment to see your caregiver for stitches (suture) or staple removal as directed.  If you  have been sent home with a continuous passive motion machine (CPM machine), 0-90 degrees 6 hrs a day   2 hrs a shift SEEK MEDICAL CARE IF: You have swelling of your calf or leg.  You develop shortness of breath or chest pain.  You have redness, swelling, or increasing pain in the wound.  There is pus or any unusual drainage coming from the surgical site.  You notice a bad smell coming from the surgical site or dressing.  The surgical site breaks open after sutures or staples have been removed.  There is persistent bleeding from the suture or staple line.  You are getting worse or are not improving.  You have any other questions or concerns.  SEEK IMMEDIATE MEDICAL CARE IF:  You have a fever.  You develop a rash.  You have difficulty breathing.  You develop any reaction or side effects to medicines given.  Your knee motion is decreasing rather than  improving.  MAKE SURE YOU:  Understand these instructions.  Will watch your condition.  Will get help right away if you are not doing well or get worse.     Do not put a pillow under the knee. Place it under the heel.    Complete by:  As directed   Place gray foam block, curve side up under heel at all times except when in CPM or when walking.  DO NOT modify, tear, cut, or change in any way the gray foam block.     Increase activity slowly as tolerated    Complete by:  As directed      TED hose    Complete by:  As directed   Use stockings (TED hose) for 2 weeks on both leg(s).  You may remove them at night for sleeping.           Follow-up Information   Follow up with Nilda Simmer, MD On 09/07/2013. (appt time 2 pm)    Specialty:  Orthopedic Surgery   Contact information:   [address] [place] [address] [phone]        Signed: Pascal Lux 08/25/2013, 9:17 AM

## 2013-08-26 NOTE — Addendum Note (Signed)
Addendum created 08/26/13 1704 by Aneyah Lortz, MD   Modules edited: Anesthesia Attestations    

## 2014-04-09 DIAGNOSIS — N390 Urinary tract infection, site not specified: Secondary | ICD-10-CM | POA: Diagnosis not present

## 2014-04-09 DIAGNOSIS — R319 Hematuria, unspecified: Secondary | ICD-10-CM | POA: Diagnosis not present

## 2014-06-14 DIAGNOSIS — J449 Chronic obstructive pulmonary disease, unspecified: Secondary | ICD-10-CM | POA: Diagnosis not present

## 2014-06-14 DIAGNOSIS — E039 Hypothyroidism, unspecified: Secondary | ICD-10-CM | POA: Diagnosis not present

## 2014-06-14 DIAGNOSIS — Z418 Encounter for other procedures for purposes other than remedying health state: Secondary | ICD-10-CM | POA: Diagnosis not present

## 2014-06-14 DIAGNOSIS — M25519 Pain in unspecified shoulder: Secondary | ICD-10-CM | POA: Diagnosis not present

## 2014-06-14 DIAGNOSIS — Z6825 Body mass index (BMI) 25.0-25.9, adult: Secondary | ICD-10-CM | POA: Diagnosis not present

## 2014-06-14 DIAGNOSIS — Z87891 Personal history of nicotine dependence: Secondary | ICD-10-CM | POA: Diagnosis not present

## 2014-06-14 DIAGNOSIS — J209 Acute bronchitis, unspecified: Secondary | ICD-10-CM | POA: Diagnosis not present

## 2014-08-13 DIAGNOSIS — H43313 Vitreous membranes and strands, bilateral: Secondary | ICD-10-CM | POA: Diagnosis not present

## 2014-08-13 DIAGNOSIS — H2513 Age-related nuclear cataract, bilateral: Secondary | ICD-10-CM | POA: Diagnosis not present

## 2014-08-20 DIAGNOSIS — M25511 Pain in right shoulder: Secondary | ICD-10-CM | POA: Diagnosis not present

## 2014-08-20 DIAGNOSIS — M150 Primary generalized (osteo)arthritis: Secondary | ICD-10-CM | POA: Diagnosis not present

## 2014-08-24 DIAGNOSIS — Z96652 Presence of left artificial knee joint: Secondary | ICD-10-CM | POA: Diagnosis not present

## 2014-08-24 DIAGNOSIS — M7581 Other shoulder lesions, right shoulder: Secondary | ICD-10-CM | POA: Diagnosis not present

## 2014-09-14 DIAGNOSIS — J449 Chronic obstructive pulmonary disease, unspecified: Secondary | ICD-10-CM | POA: Diagnosis not present

## 2014-09-14 DIAGNOSIS — Z418 Encounter for other procedures for purposes other than remedying health state: Secondary | ICD-10-CM | POA: Diagnosis not present

## 2014-09-14 DIAGNOSIS — R350 Frequency of micturition: Secondary | ICD-10-CM | POA: Diagnosis not present

## 2014-09-14 DIAGNOSIS — N390 Urinary tract infection, site not specified: Secondary | ICD-10-CM | POA: Diagnosis not present

## 2014-09-14 DIAGNOSIS — Z6825 Body mass index (BMI) 25.0-25.9, adult: Secondary | ICD-10-CM | POA: Diagnosis not present

## 2014-09-14 DIAGNOSIS — Z87891 Personal history of nicotine dependence: Secondary | ICD-10-CM | POA: Diagnosis not present

## 2014-09-14 DIAGNOSIS — E039 Hypothyroidism, unspecified: Secondary | ICD-10-CM | POA: Diagnosis not present

## 2014-10-04 DIAGNOSIS — R350 Frequency of micturition: Secondary | ICD-10-CM | POA: Diagnosis not present

## 2014-10-04 DIAGNOSIS — N390 Urinary tract infection, site not specified: Secondary | ICD-10-CM | POA: Diagnosis not present

## 2014-10-04 DIAGNOSIS — J449 Chronic obstructive pulmonary disease, unspecified: Secondary | ICD-10-CM | POA: Diagnosis not present

## 2014-10-04 DIAGNOSIS — Z418 Encounter for other procedures for purposes other than remedying health state: Secondary | ICD-10-CM | POA: Diagnosis not present

## 2015-01-18 DIAGNOSIS — Z23 Encounter for immunization: Secondary | ICD-10-CM | POA: Diagnosis not present

## 2015-03-21 DIAGNOSIS — Z1211 Encounter for screening for malignant neoplasm of colon: Secondary | ICD-10-CM | POA: Diagnosis not present

## 2015-03-21 DIAGNOSIS — Z7189 Other specified counseling: Secondary | ICD-10-CM | POA: Diagnosis not present

## 2015-03-21 DIAGNOSIS — E7800 Pure hypercholesterolemia, unspecified: Secondary | ICD-10-CM | POA: Diagnosis not present

## 2015-03-21 DIAGNOSIS — Z418 Encounter for other procedures for purposes other than remedying health state: Secondary | ICD-10-CM | POA: Diagnosis not present

## 2015-03-21 DIAGNOSIS — Z1389 Encounter for screening for other disorder: Secondary | ICD-10-CM | POA: Diagnosis not present

## 2015-03-21 DIAGNOSIS — Z0000 Encounter for general adult medical examination without abnormal findings: Secondary | ICD-10-CM | POA: Diagnosis not present

## 2015-03-21 DIAGNOSIS — E039 Hypothyroidism, unspecified: Secondary | ICD-10-CM | POA: Diagnosis not present

## 2015-03-21 DIAGNOSIS — E559 Vitamin D deficiency, unspecified: Secondary | ICD-10-CM | POA: Diagnosis not present

## 2015-03-21 DIAGNOSIS — R5383 Other fatigue: Secondary | ICD-10-CM | POA: Diagnosis not present

## 2015-03-21 DIAGNOSIS — Z6827 Body mass index (BMI) 27.0-27.9, adult: Secondary | ICD-10-CM | POA: Diagnosis not present

## 2015-05-23 DIAGNOSIS — G4700 Insomnia, unspecified: Secondary | ICD-10-CM | POA: Diagnosis not present

## 2015-05-23 DIAGNOSIS — J449 Chronic obstructive pulmonary disease, unspecified: Secondary | ICD-10-CM | POA: Diagnosis not present

## 2015-05-23 DIAGNOSIS — Z87891 Personal history of nicotine dependence: Secondary | ICD-10-CM | POA: Diagnosis not present

## 2015-05-26 DIAGNOSIS — E2839 Other primary ovarian failure: Secondary | ICD-10-CM | POA: Diagnosis not present

## 2015-07-04 DIAGNOSIS — M159 Polyosteoarthritis, unspecified: Secondary | ICD-10-CM | POA: Diagnosis not present

## 2015-07-04 DIAGNOSIS — J449 Chronic obstructive pulmonary disease, unspecified: Secondary | ICD-10-CM | POA: Diagnosis not present

## 2015-07-26 DIAGNOSIS — M159 Polyosteoarthritis, unspecified: Secondary | ICD-10-CM | POA: Diagnosis not present

## 2015-07-26 DIAGNOSIS — J449 Chronic obstructive pulmonary disease, unspecified: Secondary | ICD-10-CM | POA: Diagnosis not present

## 2015-08-12 DIAGNOSIS — J441 Chronic obstructive pulmonary disease with (acute) exacerbation: Secondary | ICD-10-CM | POA: Diagnosis not present

## 2015-08-17 DIAGNOSIS — R05 Cough: Secondary | ICD-10-CM | POA: Diagnosis not present

## 2015-08-17 DIAGNOSIS — Z299 Encounter for prophylactic measures, unspecified: Secondary | ICD-10-CM | POA: Diagnosis not present

## 2015-08-17 DIAGNOSIS — Z87891 Personal history of nicotine dependence: Secondary | ICD-10-CM | POA: Diagnosis not present

## 2015-08-17 DIAGNOSIS — R0602 Shortness of breath: Secondary | ICD-10-CM | POA: Diagnosis not present

## 2015-08-17 DIAGNOSIS — J209 Acute bronchitis, unspecified: Secondary | ICD-10-CM | POA: Diagnosis not present

## 2015-08-17 DIAGNOSIS — J069 Acute upper respiratory infection, unspecified: Secondary | ICD-10-CM | POA: Diagnosis not present

## 2015-08-18 DIAGNOSIS — M159 Polyosteoarthritis, unspecified: Secondary | ICD-10-CM | POA: Diagnosis not present

## 2015-08-18 DIAGNOSIS — J449 Chronic obstructive pulmonary disease, unspecified: Secondary | ICD-10-CM | POA: Diagnosis not present

## 2015-08-23 DIAGNOSIS — J069 Acute upper respiratory infection, unspecified: Secondary | ICD-10-CM | POA: Diagnosis not present

## 2015-08-23 DIAGNOSIS — J449 Chronic obstructive pulmonary disease, unspecified: Secondary | ICD-10-CM | POA: Diagnosis not present

## 2015-08-23 DIAGNOSIS — Z87891 Personal history of nicotine dependence: Secondary | ICD-10-CM | POA: Diagnosis not present

## 2015-09-14 DIAGNOSIS — J449 Chronic obstructive pulmonary disease, unspecified: Secondary | ICD-10-CM | POA: Diagnosis not present

## 2015-09-14 DIAGNOSIS — M159 Polyosteoarthritis, unspecified: Secondary | ICD-10-CM | POA: Diagnosis not present

## 2015-10-28 DIAGNOSIS — J449 Chronic obstructive pulmonary disease, unspecified: Secondary | ICD-10-CM | POA: Diagnosis not present

## 2015-10-28 DIAGNOSIS — M159 Polyosteoarthritis, unspecified: Secondary | ICD-10-CM | POA: Diagnosis not present

## 2015-12-26 DIAGNOSIS — Z23 Encounter for immunization: Secondary | ICD-10-CM | POA: Diagnosis not present

## 2016-02-01 DIAGNOSIS — H5319 Other subjective visual disturbances: Secondary | ICD-10-CM | POA: Diagnosis not present

## 2016-02-01 DIAGNOSIS — H16143 Punctate keratitis, bilateral: Secondary | ICD-10-CM | POA: Diagnosis not present

## 2016-02-16 ENCOUNTER — Ambulatory Visit (HOSPITAL_COMMUNITY)
Admission: EM | Admit: 2016-02-16 | Discharge: 2016-02-16 | Disposition: A | Discharge status: Home / Self Care | Payer: Medicare Other | Attending: Emergency Medicine | Admitting: Emergency Medicine

## 2016-02-16 ENCOUNTER — Encounter (HOSPITAL_COMMUNITY): Payer: Self-pay | Admitting: Emergency Medicine

## 2016-02-16 DIAGNOSIS — J0110 Acute frontal sinusitis, unspecified: Secondary | ICD-10-CM

## 2016-02-16 MED ORDER — AMOXICILLIN-POT CLAVULANATE 875-125 MG PO TABS: 1.0000 | ORAL_TABLET | Freq: Two times a day (BID) | ORAL | 0 refills | Status: DC

## 2016-02-16 NOTE — ED Provider Notes (Signed)
[facility]    CSN: [id number] Arrival date & time: 02/16/16  1257     History   Chief Complaint Chief Complaint  Patient presents with  . Facial Pain    HPI [name] [name] is a 70 y.o. female.   HPI  She is a 70 year old woman here for evaluation of sinus pressure. She reports her symptoms started almost 2 weeks ago with nasal congestion, rhinorrhea, scratchy throat, and cough. Most of her symptoms have improved, but she continues to have sinus pressure and postnasal drainage. She states her sinuses feel dry. No known fevers. Her husband has been in the hospital the last week.  Past Medical History:  Diagnosis Date  . Anxiety   . Blood in the urine    had "problem with blood in urine as child [age]yrs -out of school 1 yr no dx have hematura at time as adult  . COPD (chronic obstructive pulmonary disease) (HCC)   . Depression   . History of pneumonia 2010   in ICU  . History of recurrent UTIs   . Hypothyroidism   . Left knee DJD   . Pneumonia    2010 on ventalator  . Restless leg syndrome   . Shortness of breath    exersion,occ at rest    Patient Active Problem List   Diagnosis Date Noted  . Left knee DJD 08/24/2013  . DJD (degenerative joint disease) of knee 08/24/2013  . Hypothyroidism   . Restless leg syndrome   . Depression   . Anxiety   . COPD (chronic obstructive pulmonary disease) (HCC)     Past Surgical History:  Procedure Laterality Date  . ABDOMINAL HYSTERECTOMY    . KNEE ARTHROSCOPY WITH MEDIAL MENISECTOMY Left 08/24/2013   Procedure: LEFT KNEE ARTHROSCOPY WITH MEDIAL MENISCECTOMY  ;  Surgeon: Nilda Simmerobert A Wainer, MD;  Location: [facility] OR;  Service: Orthopedics;  Laterality: Left;  . PARTIAL KNEE ARTHROPLASTY Left 08/24/2013   dr Denton Arwanier  . PARTIAL KNEE ARTHROPLASTY Left 08/24/2013   Procedure: LEFT KNEE ARTHROPLASTY UNI COMPARTMENTAL ;  Surgeon: Nilda Simmerobert A Wainer, MD;  Location: [facility] OR;  Service: Orthopedics;  Laterality: Left;  Left knee arthroplasty  uni compartment  With left knee scope medial meniscectomy    . TOTAL VAGINAL HYSTERECTOMY      OB History    No data available       Home Medications    Prior to Admission medications   Medication Sig Start Date End Date Taking? Authorizing Provider  albuterol (PROVENTIL HFA;VENTOLIN HFA) 108 (90 BASE) MCG/ACT inhaler Inhale 1 puff into the lungs every 6 (six) hours as needed for wheezing or shortness of breath.   Yes Historical Provider, MD  ALPRAZolam Prudy Feeler(XANAX) 0.5 MG tablet Take 0.5 mg by mouth at bedtime as needed for anxiety.   Yes Historical Provider, MD  amitriptyline (ELAVIL) 75 MG tablet Take 75 mg by mouth at bedtime.   Yes Historical Provider, MD  gabapentin (NEURONTIN) 300 MG capsule Take 300 mg by mouth 2 (two) times daily.   Yes Historical Provider, MD  levothyroxine (SYNTHROID, LEVOTHROID) 100 MCG tablet Take 100 mcg by mouth daily before breakfast.   Yes Historical Provider, MD  acetaminophen (TYLENOL) 325 MG tablet Take 2 tablets (650 mg total) by mouth every 6 (six) hours as needed for mild pain (or Fever >/= 101). 08/25/13   Kirstin Shepperson, PA-C  amoxicillin-clavulanate (AUGMENTIN) 875-125 MG tablet Take 1 tablet by mouth 2 (two) times daily. 02/16/16   Denny PeonErin  Chip Boer, MD  bisacodyl (DULCOLAX) 5 MG EC tablet Take 2 tablets every night with dinner until bowel movement.  LAXITIVE.  Restart if two days since last bowel movement 08/25/13   Kirstin Shepperson, PA-C  celecoxib (CELEBREX) 200 MG capsule 1 tab po q day with food for pain and  swelling 08/25/13   Kirstin Shepperson, PA-C  Cholecalciferol (VITAMIN D) 2000 UNITS CAPS Take 2,000 Units by mouth daily.    Historical Provider, MD  docusate sodium 100 MG CAPS 1 tab 2 times a day while on narcotics.  STOOL SOFTENER 08/25/13   Kirstin Shepperson, PA-C  mupirocin ointment (BACTROBAN) 2 % Place 1 application into the nose 2 (two) times daily. 08/25/13   Kirstin Shepperson, PA-C  oxyCODONE (OXY IR/ROXICODONE) 5 MG immediate  release tablet 1-2 tablets every 4-6 hrs as needed for pain 08/25/13   Kirstin Shepperson, PA-C  rivaroxaban (XARELTO) 10 MG TABS tablet Take 1 tablet (10 mg total) by mouth daily with breakfast. 08/25/13   Kirstin Shepperson, PA-C    Family History Family History  Problem Relation Age of Onset  . Stroke Mother   . Heart disease Father   . Heart attack Father   . Hypertension Father     Social History Social History  Substance Use Topics  . Smoking status: Former Smoker    Packs/day: 1.50    Years: 30.00    Types: Cigarettes    Quit date: 04/10/2003  . Smokeless tobacco: Never Used  . Alcohol use No     Allergies   Patient has no known allergies.   Review of Systems Review of Systems As in history of present illness  Physical Exam Triage Vital Signs ED Triage Vitals [02/16/16 1319]  Enc Vitals Group     BP 139/66     Pulse Rate 78     Resp 14     Temp 97.4 F (36.3 C)     Temp Source Oral     SpO2 98 %     Weight      Height      Head Circumference      Peak Flow      Pain Score      Pain Loc      Pain Edu?      Excl. in GC?    No data found.   Updated Vital Signs BP 139/66 (BP Location: Left Arm)   Pulse 78   Temp 97.4 F (36.3 C) (Oral)   Resp 14   SpO2 98%   Visual Acuity Right Eye Distance:   Left Eye Distance:   Bilateral Distance:    Right Eye Near:   Left Eye Near:    Bilateral Near:     Physical Exam  Constitutional: She is oriented to person, place, and time. She appears well-developed and well-nourished. No distress.  HENT:  Mouth/Throat: No oropharyngeal exudate.  Nasal mucosa is erythematous. Bilateral frontal sinus tenderness. Oropharynx mildly erythematous.  Cardiovascular: Normal rate, regular rhythm and normal heart sounds.   No murmur heard. Pulmonary/Chest: Effort normal and breath sounds normal. No respiratory distress. She has no wheezes. She has no rales.  Neurological: She is alert and oriented to person, place, and  time.     UC Treatments / Results  Labs (all labs ordered are listed, but only abnormal results are displayed) Labs Reviewed - No data to display  EKG  EKG Interpretation None       Radiology No results found.  Procedures Procedures (  including critical care time)  Medications Ordered in UC Medications - No data to display   Initial Impression / Assessment and Plan / UC Course  I have reviewed the triage vital signs and the nursing notes.  Pertinent labs & imaging results that were available during my care of the patient were reviewed by me and considered in my medical decision making (see chart for details).  Clinical Course     We'll treat with Augmentin. Recommended frequent use of nasal saline spray. Follow-up as needed.  Final Clinical Impressions(s) / UC Diagnoses   Final diagnoses:  Acute frontal sinusitis, recurrence not specified    New Prescriptions New Prescriptions   AMOXICILLIN-CLAVULANATE (AUGMENTIN) 875-125 MG TABLET    Take 1 tablet by mouth 2 (two) times daily.     Charm RingsErin J Malon Siddall, MD 02/16/16 1343

## 2016-02-16 NOTE — Discharge Instructions (Signed)
You have a sinus infection. Take Augmentin twice a day for 10 days. Use nasal saline spray frequently throughout the day. Make sure you're drinking plenty of fluids. Follow-up as needed.

## 2016-02-16 NOTE — ED Triage Notes (Signed)
[facility] a [facility] of [facility] pain and pressure as well as drainage. The patient reported a headache and sinus congestion.

## 2016-03-20 DIAGNOSIS — J189 Pneumonia, unspecified organism: Secondary | ICD-10-CM | POA: Diagnosis not present

## 2016-03-20 DIAGNOSIS — E7800 Pure hypercholesterolemia, unspecified: Secondary | ICD-10-CM | POA: Diagnosis not present

## 2016-03-20 DIAGNOSIS — F419 Anxiety disorder, unspecified: Secondary | ICD-10-CM | POA: Diagnosis not present

## 2016-03-20 DIAGNOSIS — E039 Hypothyroidism, unspecified: Secondary | ICD-10-CM | POA: Diagnosis not present

## 2016-03-20 DIAGNOSIS — I959 Hypotension, unspecified: Secondary | ICD-10-CM | POA: Diagnosis not present

## 2016-03-20 DIAGNOSIS — A419 Sepsis, unspecified organism: Secondary | ICD-10-CM | POA: Diagnosis not present

## 2016-03-20 DIAGNOSIS — M810 Age-related osteoporosis without current pathological fracture: Secondary | ICD-10-CM | POA: Diagnosis not present

## 2016-03-20 DIAGNOSIS — R079 Chest pain, unspecified: Secondary | ICD-10-CM | POA: Diagnosis not present

## 2016-03-21 DIAGNOSIS — Z87891 Personal history of nicotine dependence: Secondary | ICD-10-CM | POA: Diagnosis not present

## 2016-03-21 DIAGNOSIS — Z79899 Other long term (current) drug therapy: Secondary | ICD-10-CM | POA: Diagnosis not present

## 2016-03-21 DIAGNOSIS — M810 Age-related osteoporosis without current pathological fracture: Secondary | ICD-10-CM | POA: Diagnosis present

## 2016-03-21 DIAGNOSIS — E7800 Pure hypercholesterolemia, unspecified: Secondary | ICD-10-CM | POA: Diagnosis present

## 2016-03-21 DIAGNOSIS — J189 Pneumonia, unspecified organism: Secondary | ICD-10-CM | POA: Diagnosis present

## 2016-03-21 DIAGNOSIS — A419 Sepsis, unspecified organism: Secondary | ICD-10-CM | POA: Diagnosis present

## 2016-03-21 DIAGNOSIS — E039 Hypothyroidism, unspecified: Secondary | ICD-10-CM | POA: Diagnosis present

## 2016-03-21 DIAGNOSIS — Z885 Allergy status to narcotic agent status: Secondary | ICD-10-CM | POA: Diagnosis not present

## 2016-03-21 DIAGNOSIS — F419 Anxiety disorder, unspecified: Secondary | ICD-10-CM | POA: Diagnosis present

## 2016-03-22 DIAGNOSIS — J441 Chronic obstructive pulmonary disease with (acute) exacerbation: Secondary | ICD-10-CM | POA: Diagnosis not present

## 2016-03-22 DIAGNOSIS — R748 Abnormal levels of other serum enzymes: Secondary | ICD-10-CM | POA: Diagnosis not present

## 2016-03-22 DIAGNOSIS — J9601 Acute respiratory failure with hypoxia: Secondary | ICD-10-CM | POA: Diagnosis present

## 2016-03-22 DIAGNOSIS — J189 Pneumonia, unspecified organism: Secondary | ICD-10-CM | POA: Diagnosis not present

## 2016-03-22 DIAGNOSIS — A419 Sepsis, unspecified organism: Secondary | ICD-10-CM | POA: Diagnosis not present

## 2016-03-22 DIAGNOSIS — J181 Lobar pneumonia, unspecified organism: Secondary | ICD-10-CM | POA: Diagnosis not present

## 2016-03-22 DIAGNOSIS — R079 Chest pain, unspecified: Secondary | ICD-10-CM | POA: Diagnosis not present

## 2016-03-22 DIAGNOSIS — Z87891 Personal history of nicotine dependence: Secondary | ICD-10-CM | POA: Diagnosis not present

## 2016-03-22 DIAGNOSIS — I340 Nonrheumatic mitral (valve) insufficiency: Secondary | ICD-10-CM | POA: Diagnosis not present

## 2016-03-22 DIAGNOSIS — G4733 Obstructive sleep apnea (adult) (pediatric): Secondary | ICD-10-CM | POA: Diagnosis present

## 2016-03-22 DIAGNOSIS — F419 Anxiety disorder, unspecified: Secondary | ICD-10-CM | POA: Diagnosis not present

## 2016-03-22 DIAGNOSIS — R911 Solitary pulmonary nodule: Secondary | ICD-10-CM | POA: Diagnosis present

## 2016-03-22 DIAGNOSIS — R0602 Shortness of breath: Secondary | ICD-10-CM | POA: Diagnosis not present

## 2016-03-22 DIAGNOSIS — E039 Hypothyroidism, unspecified: Secondary | ICD-10-CM | POA: Diagnosis not present

## 2016-03-22 DIAGNOSIS — G4700 Insomnia, unspecified: Secondary | ICD-10-CM | POA: Diagnosis not present

## 2016-04-04 DIAGNOSIS — E079 Disorder of thyroid, unspecified: Secondary | ICD-10-CM | POA: Diagnosis not present

## 2016-04-04 DIAGNOSIS — D72829 Elevated white blood cell count, unspecified: Secondary | ICD-10-CM | POA: Diagnosis not present

## 2016-04-04 DIAGNOSIS — J159 Unspecified bacterial pneumonia: Secondary | ICD-10-CM | POA: Diagnosis not present

## 2016-04-04 DIAGNOSIS — Z5189 Encounter for other specified aftercare: Secondary | ICD-10-CM | POA: Diagnosis not present

## 2016-04-04 DIAGNOSIS — J9601 Acute respiratory failure with hypoxia: Secondary | ICD-10-CM | POA: Diagnosis not present

## 2016-04-04 DIAGNOSIS — R911 Solitary pulmonary nodule: Secondary | ICD-10-CM | POA: Diagnosis not present

## 2016-04-12 ENCOUNTER — Emergency Department (HOSPITAL_COMMUNITY): Payer: Medicare Other

## 2016-04-12 ENCOUNTER — Emergency Department (HOSPITAL_COMMUNITY)
Admission: EM | Admit: 2016-04-12 | Discharge: 2016-04-12 | Disposition: A | Discharge status: Home / Self Care | Payer: Medicare Other | Attending: Physician Assistant | Admitting: Physician Assistant

## 2016-04-12 ENCOUNTER — Encounter (HOSPITAL_COMMUNITY): Payer: Self-pay

## 2016-04-12 DIAGNOSIS — E039 Hypothyroidism, unspecified: Secondary | ICD-10-CM | POA: Diagnosis not present

## 2016-04-12 DIAGNOSIS — J449 Chronic obstructive pulmonary disease, unspecified: Secondary | ICD-10-CM | POA: Insufficient documentation

## 2016-04-12 DIAGNOSIS — Z7901 Long term (current) use of anticoagulants: Secondary | ICD-10-CM | POA: Insufficient documentation

## 2016-04-12 DIAGNOSIS — Z79899 Other long term (current) drug therapy: Secondary | ICD-10-CM | POA: Diagnosis not present

## 2016-04-12 DIAGNOSIS — Z87891 Personal history of nicotine dependence: Secondary | ICD-10-CM | POA: Insufficient documentation

## 2016-04-12 DIAGNOSIS — J40 Bronchitis, not specified as acute or chronic: Secondary | ICD-10-CM

## 2016-04-12 DIAGNOSIS — J189 Pneumonia, unspecified organism: Secondary | ICD-10-CM | POA: Diagnosis present

## 2016-04-12 DIAGNOSIS — R05 Cough: Secondary | ICD-10-CM | POA: Insufficient documentation

## 2016-04-12 LAB — CBC WITH DIFFERENTIAL/PLATELET
BASOS PCT: 0 %
Basophils Absolute: 0 10*3/uL (ref 0.0–0.1)
EOS PCT: 8 %
Eosinophils Absolute: 1 10*3/uL — ABNORMAL HIGH (ref 0.0–0.7)
HCT: 36.9 % (ref 36.0–46.0)
Hemoglobin: 11.6 g/dL — ABNORMAL LOW (ref 12.0–15.0)
Lymphocytes Relative: 14 %
Lymphs Abs: 1.7 10*3/uL (ref 0.7–4.0)
MCH: 25.9 pg — ABNORMAL LOW (ref 26.0–34.0)
MCHC: 31.4 g/dL (ref 30.0–36.0)
MCV: 82.4 fL (ref 78.0–100.0)
Monocytes Absolute: 0.6 10*3/uL (ref 0.1–1.0)
Monocytes Relative: 5 %
NEUTROS PCT: 73 %
Neutro Abs: 9.5 10*3/uL — ABNORMAL HIGH (ref 1.7–7.7)
PLATELETS: 367 10*3/uL (ref 150–400)
RBC: 4.48 MIL/uL (ref 3.87–5.11)
RDW: 15.2 % (ref 11.5–15.5)
WBC: 12.8 10*3/uL — AB (ref 4.0–10.5)

## 2016-04-12 LAB — COMPREHENSIVE METABOLIC PANEL
ALT: 23 U/L (ref 14–54)
AST: 28 U/L (ref 15–41)
Albumin: 3.6 g/dL (ref 3.5–5.0)
Alkaline Phosphatase: 144 U/L — ABNORMAL HIGH (ref 38–126)
Anion gap: 9 (ref 5–15)
BILIRUBIN TOTAL: 0.4 mg/dL (ref 0.3–1.2)
BUN: 9 mg/dL (ref 6–20)
CO2: 23 mmol/L (ref 22–32)
CREATININE: 0.87 mg/dL (ref 0.44–1.00)
Calcium: 10.1 mg/dL (ref 8.9–10.3)
Chloride: 107 mmol/L (ref 101–111)
GFR calc non Af Amer: >60 mL/min (ref >60)
Glucose, Bld: 86 mg/dL (ref 65–99)
Potassium: 4.1 mmol/L (ref 3.5–5.1)
Sodium: 139 mmol/L (ref 135–145)
TOTAL PROTEIN: 7.6 g/dL (ref 6.5–8.1)

## 2016-04-12 MED ORDER — LEVOFLOXACIN 750 MG PO TABS
750.0000 mg | ORAL_TABLET | Freq: Once | ORAL | Status: AC
  Administered 2016-04-12: 750 mg via ORAL
  Filled 2016-04-12: qty 1

## 2016-04-12 MED ORDER — LEVOFLOXACIN 500 MG PO TABS: 500.0000 mg | ORAL_TABLET | Freq: Every day | ORAL | 0 refills | Status: DC

## 2016-04-12 NOTE — ED Triage Notes (Signed)
Pt reports she had PNA and was d/c from the hospital on 12/19. She reports recurrent cough and thick sputum. Pt is here because she is afraid she will get PNA again.

## 2016-04-12 NOTE — Discharge Instructions (Signed)
Please follow up with PCP.

## 2016-04-12 NOTE — ED Provider Notes (Signed)
[facility]Y DEPT Provider Note   CSN: [id number] Arrival date & time: 04/12/16  1144     History   Chief Complaint Chief Complaint  Patient presents with  . Recurrent Pneumonia    HPI [name] [name] is a 72 y.o. female.  HPI   Ho recent pna, discharged from [facility] on 12/14 after admission for pna, sepsis and UTI. Was feeling improved, then developed mild cough and congestion for the last week. No fever. Mild fatigue.   Has inhalor at home.   Past Medical History:  Diagnosis Date  . Anxiety   . Blood in the urine    had "problem with blood in urine as child 89yrs -out of school 1 yr no dx have hematura at time as adult  . COPD (chronic obstructive pulmonary disease) (HCC)   . Depression   . History of pneumonia 2010   in ICU  . History of recurrent UTIs   . Hypothyroidism   . Left knee DJD   . Pneumonia    2010 on ventalator  . Restless leg syndrome   . Shortness of breath    exersion,occ at rest    Patient Active Problem List   Diagnosis Date Noted  . Left knee DJD 08/24/2013  . DJD (degenerative joint disease) of knee 08/24/2013  . Hypothyroidism   . Restless leg syndrome   . Depression   . Anxiety   . COPD (chronic obstructive pulmonary disease) (HCC)     Past Surgical History:  Procedure Laterality Date  . ABDOMINAL HYSTERECTOMY    . KNEE ARTHROSCOPY WITH MEDIAL MENISECTOMY Left 08/24/2013   Procedure: LEFT KNEE ARTHROSCOPY WITH MEDIAL MENISCECTOMY  ;  Surgeon: Nilda Simmer, MD;  Location: [facility] OR;  Service: Orthopedics;  Laterality: Left;  . PARTIAL KNEE ARTHROPLASTY Left 08/24/2013   dr Denton Ar  . PARTIAL KNEE ARTHROPLASTY Left 08/24/2013   Procedure: LEFT KNEE ARTHROPLASTY UNI COMPARTMENTAL ;  Surgeon: Nilda Simmer, MD;  Location: [facility] OR;  Service: Orthopedics;  Laterality: Left;  Left knee arthroplasty uni compartment  With left knee scope medial meniscectomy    . TOTAL VAGINAL HYSTERECTOMY      OB History    No data available        Home Medications    Prior to Admission medications   Medication Sig Start Date End Date Taking? Authorizing Provider  acetaminophen (TYLENOL) 325 MG tablet Take 2 tablets (650 mg total) by mouth every 6 (six) hours as needed for mild pain (or Fever >/= 101). 08/25/13   Kirstin Shepperson, PA-C  albuterol (PROVENTIL HFA;VENTOLIN HFA) 108 (90 BASE) MCG/ACT inhaler Inhale 1 puff into the lungs every 6 (six) hours as needed for wheezing or shortness of breath.    Historical Provider, MD  ALPRAZolam Prudy Feeler) 0.5 MG tablet Take 0.5 mg by mouth at bedtime as needed for anxiety.    Historical Provider, MD  amitriptyline (ELAVIL) 75 MG tablet Take 75 mg by mouth at bedtime.    Historical Provider, MD  amoxicillin-clavulanate (AUGMENTIN) 875-125 MG tablet Take 1 tablet by mouth 2 (two) times daily. 02/16/16   Charm Rings, MD  bisacodyl (DULCOLAX) 5 MG EC tablet Take 2 tablets every night with dinner until bowel movement.  LAXITIVE.  Restart if two days since last bowel movement 08/25/13   Kirstin Shepperson, PA-C  celecoxib (CELEBREX) 200 MG capsule 1 tab po q day with food for pain and  swelling 08/25/13   Kirstin Shepperson, PA-C  Cholecalciferol (VITAMIN D) 2000  UNITS CAPS Take 2,000 Units by mouth daily.    Historical Provider, MD  docusate sodium 100 MG CAPS 1 tab 2 times a day while on narcotics.  STOOL SOFTENER 08/25/13   Kirstin Shepperson, PA-C  gabapentin (NEURONTIN) 300 MG capsule Take 300 mg by mouth 2 (two) times daily.    Historical Provider, MD  levothyroxine (SYNTHROID, LEVOTHROID) 100 MCG tablet Take 100 mcg by mouth daily before breakfast.    Historical Provider, MD  mupirocin ointment (BACTROBAN) 2 % Place 1 application into the nose 2 (two) times daily. 08/25/13   Kirstin Shepperson, PA-C  oxyCODONE (OXY IR/ROXICODONE) 5 MG immediate release tablet 1-2 tablets every 4-6 hrs as needed for pain 08/25/13   Kirstin Shepperson, PA-C  rivaroxaban (XARELTO) 10 MG TABS tablet Take 1 tablet (10 mg  total) by mouth daily with breakfast. 08/25/13   Kirstin Shepperson, PA-C    Family History Family History  Problem Relation Age of Onset  . Stroke Mother   . Heart disease Father   . Heart attack Father   . Hypertension Father     Social History Social History  Substance Use Topics  . Smoking status: Former Smoker    Packs/day: 1.50    Years: 30.00    Types: Cigarettes    Quit date: 04/10/2003  . Smokeless tobacco: Never Used  . Alcohol use No     Allergies   Patient has no known allergies.   Review of Systems Review of Systems  Constitutional: Negative for activity change, fatigue and fever.  Respiratory: Positive for cough. Negative for shortness of breath.   Cardiovascular: Negative for chest pain.  Gastrointestinal: Negative for abdominal pain.  All other systems reviewed and are negative.    Physical Exam Updated Vital Signs BP 144/87   Pulse 92   Temp 98.2 F (36.8 C) (Oral)   Resp 18   SpO2 99%   Physical Exam  Constitutional: She is oriented to person, place, and time. She appears well-developed and well-nourished.  HENT:  Head: Normocephalic and atraumatic.  Eyes: Right eye exhibits no discharge.  Cardiovascular: Normal rate, regular rhythm and normal heart sounds.   No murmur heard. Pulmonary/Chest: Effort normal and breath sounds normal. She has no wheezes. She has no rales.  Abdominal: Soft. She exhibits no distension. There is no tenderness.  Neurological: She is oriented to person, place, and time.  Skin: Skin is warm and dry. She is not diaphoretic.  Psychiatric: She has a normal mood and affect.  Nursing note and vitals reviewed.    ED Treatments / Results  Labs (all labs ordered are listed, but only abnormal results are displayed) Labs Reviewed  COMPREHENSIVE METABOLIC PANEL - Abnormal; Notable for the following:       Result Value   Alkaline Phosphatase 144 (*)    All other components within normal limits  CBC WITH  DIFFERENTIAL/PLATELET - Abnormal; Notable for the following:    WBC 12.8 (*)    Hemoglobin 11.6 (*)    MCH 25.9 (*)    Neutro Abs 9.5 (*)    Eosinophils Absolute 1.0 (*)    All other components within normal limits    EKG  EKG Interpretation None       Radiology Dg Chest 2 View  Result Date: 04/12/2016 CLINICAL DATA:  Pneumonia in December, discharged from hospital 03/27/2016, recurrent cough with thick sputum question recurrent pneumonia, history COPD, former smoker EXAM: CHEST  2 VIEW COMPARISON:  03/20/2016 FINDINGS: Normal heart size, mediastinal  contours, and pulmonary vascularity. Minimal scarring at LEFT base. Improved LEFT mid lung opacity likely representing infiltrate on previous exam. No new infiltrate, pleural effusion or pneumothorax. Bones demineralized with scattered endplate spur formation thoracic spine. IMPRESSION: Improved LEFT mid lung infiltrate since previous exam. No new abnormalities. Electronically Signed   By: Ulyses Southward M.D.   On: 04/12/2016 12:49    Procedures Procedures (including critical care time)  Medications Ordered in ED Medications - No data to display   Initial Impression / Assessment and Plan / ED Course  I have reviewed the triage vital signs and the nursing notes.  Pertinent labs & imaging results that were available during my care of the patient were reviewed by me and considered in my medical decision making (see chart for details).  Clinical Course     Well-appearing 71 year old female presenting with cough. Patient had cough for the last week. Patient here to checked out to make sure she does not be developing pneumonia. Patient had recent pneumonia [facility]. Patient found spot on CT which they're going to follow up in 2 weeks. Patient is here she is concerned that she has pneumonia. Patient has had cough and congestion over one week. We'll treat her for bronchitis today. Patient has inhalers at  home. She is oxygenating well on room air, no tachypnea. We'll treat with Levaquin (per patient request) and have her follow-up with her physicians at [facility].  Patient is comfortable, ambulatory, and taking PO at time of discharge.  Patient expressed understanding about return precautions.    Final Clinical Impressions(s) / ED Diagnoses   Final diagnoses:  None    New Prescriptions New Prescriptions   No medications on file     Blanche Scovell Randall An, MD 04/12/16 1814

## 2016-04-12 NOTE — ED Notes (Signed)
Pt ambulated without an complications. Pt oxygen sat 98-100% while ambulating. Pt resting on side of the bed without any concerns.

## 2016-04-27 DIAGNOSIS — G479 Sleep disorder, unspecified: Secondary | ICD-10-CM | POA: Diagnosis not present

## 2016-04-27 DIAGNOSIS — R51 Headache: Secondary | ICD-10-CM | POA: Diagnosis not present

## 2016-04-27 DIAGNOSIS — J189 Pneumonia, unspecified organism: Secondary | ICD-10-CM | POA: Diagnosis not present

## 2016-04-27 DIAGNOSIS — R911 Solitary pulmonary nodule: Secondary | ICD-10-CM | POA: Insufficient documentation

## 2016-04-27 DIAGNOSIS — R918 Other nonspecific abnormal finding of lung field: Secondary | ICD-10-CM | POA: Diagnosis not present

## 2016-04-27 DIAGNOSIS — Z79899 Other long term (current) drug therapy: Secondary | ICD-10-CM | POA: Diagnosis not present

## 2016-04-27 DIAGNOSIS — Z87891 Personal history of nicotine dependence: Secondary | ICD-10-CM | POA: Diagnosis not present

## 2016-05-09 DIAGNOSIS — E559 Vitamin D deficiency, unspecified: Secondary | ICD-10-CM | POA: Diagnosis not present

## 2016-05-09 DIAGNOSIS — F331 Major depressive disorder, recurrent, moderate: Secondary | ICD-10-CM | POA: Diagnosis not present

## 2016-05-09 DIAGNOSIS — Z0389 Encounter for observation for other suspected diseases and conditions ruled out: Secondary | ICD-10-CM | POA: Diagnosis not present

## 2016-05-09 DIAGNOSIS — G4709 Other insomnia: Secondary | ICD-10-CM | POA: Diagnosis not present

## 2016-05-09 DIAGNOSIS — E039 Hypothyroidism, unspecified: Secondary | ICD-10-CM | POA: Diagnosis not present

## 2016-05-09 DIAGNOSIS — R5383 Other fatigue: Secondary | ICD-10-CM | POA: Diagnosis not present

## 2016-05-09 DIAGNOSIS — Z13220 Encounter for screening for lipoid disorders: Secondary | ICD-10-CM | POA: Diagnosis not present

## 2016-05-24 DIAGNOSIS — M810 Age-related osteoporosis without current pathological fracture: Secondary | ICD-10-CM | POA: Diagnosis not present

## 2016-05-24 DIAGNOSIS — E559 Vitamin D deficiency, unspecified: Secondary | ICD-10-CM | POA: Diagnosis not present

## 2016-05-24 DIAGNOSIS — E039 Hypothyroidism, unspecified: Secondary | ICD-10-CM | POA: Diagnosis not present

## 2016-05-28 DIAGNOSIS — R5383 Other fatigue: Secondary | ICD-10-CM | POA: Diagnosis not present

## 2016-05-28 DIAGNOSIS — E039 Hypothyroidism, unspecified: Secondary | ICD-10-CM | POA: Diagnosis not present

## 2016-05-28 DIAGNOSIS — Z0389 Encounter for observation for other suspected diseases and conditions ruled out: Secondary | ICD-10-CM | POA: Diagnosis not present

## 2016-06-04 DIAGNOSIS — E039 Hypothyroidism, unspecified: Secondary | ICD-10-CM | POA: Diagnosis not present

## 2016-06-04 DIAGNOSIS — R5383 Other fatigue: Secondary | ICD-10-CM | POA: Diagnosis not present

## 2016-06-04 DIAGNOSIS — R0600 Dyspnea, unspecified: Secondary | ICD-10-CM | POA: Diagnosis not present

## 2016-06-04 DIAGNOSIS — G4709 Other insomnia: Secondary | ICD-10-CM | POA: Diagnosis not present

## 2016-06-06 ENCOUNTER — Other Ambulatory Visit (HOSPITAL_COMMUNITY): Payer: Self-pay | Admitting: Internal Medicine

## 2016-06-06 DIAGNOSIS — R0600 Dyspnea, unspecified: Secondary | ICD-10-CM

## 2016-06-07 ENCOUNTER — Ambulatory Visit (HOSPITAL_COMMUNITY)
Admission: RE | Admit: 2016-06-07 | Discharge: 2016-06-07 | Disposition: A | Discharge status: Home / Self Care | Payer: Medicare Other | Source: Ambulatory Visit | Attending: Internal Medicine | Admitting: Internal Medicine

## 2016-06-07 DIAGNOSIS — J449 Chronic obstructive pulmonary disease, unspecified: Secondary | ICD-10-CM | POA: Insufficient documentation

## 2016-06-07 DIAGNOSIS — R0600 Dyspnea, unspecified: Secondary | ICD-10-CM

## 2016-06-07 DIAGNOSIS — I071 Rheumatic tricuspid insufficiency: Secondary | ICD-10-CM | POA: Diagnosis not present

## 2016-06-07 DIAGNOSIS — Z87891 Personal history of nicotine dependence: Secondary | ICD-10-CM | POA: Insufficient documentation

## 2016-06-07 DIAGNOSIS — I340 Nonrheumatic mitral (valve) insufficiency: Secondary | ICD-10-CM | POA: Diagnosis not present

## 2016-06-22 DIAGNOSIS — R3121 Asymptomatic microscopic hematuria: Secondary | ICD-10-CM | POA: Diagnosis not present

## 2016-06-22 DIAGNOSIS — N3001 Acute cystitis with hematuria: Secondary | ICD-10-CM | POA: Diagnosis not present

## 2016-07-02 ENCOUNTER — Encounter: Payer: Self-pay | Admitting: Cardiology

## 2016-07-02 NOTE — Progress Notes (Signed)
Cardiology Office Note  Date: 07/04/2016   ID: [name] [name], DOB 10/23/1945, MRN [id number]  PCP: Wilson SingerGOSRANI,NIMISH C, MD  Consulting Cardiologist: Nona DellSamuel Tammala Weider, MD   Chief Complaint  Patient presents with  . Shortness of Breath    History of Present Illness: [name] [name] is a 71 y.o. female referred for cardiology consultation by Dr. Karilyn CotaGosrani for the evaluation of shortness of breath.She reports a history of COPD, also pneumonia requiring [facility]. She reports fairly chronic shortness of breath with activity, no definite chest pain. She has diastolic dysfunction based on echocardiography, and there are also concerns that she may have obstructive sleep apnea with sleep study pending. She states that she is fatigued a lot of the time, does not sleep well.  Recent echocardiogram showed normal LVEF with evidence of diastolic dysfunction as evidenced by increased LV filling pressures.  She does have a long-standing tobacco use history but quit back in 2004. Also family history of premature CAD. She has not undergone any prior ischemic testing. Baseline ECG today is normal, personally reviewed by me.  Past Medical History:  Diagnosis Date  . COPD (chronic obstructive pulmonary disease) (HCC)   . Depression   . History of pneumonia    2010 on ventalator  . History of recurrent UTIs   . Hypothyroidism   . Left knee DJD   . PTSD (post-traumatic stress disorder)   . Restless leg syndrome     Past Surgical History:  Procedure Laterality Date  . ABDOMINAL HYSTERECTOMY    . KNEE ARTHROSCOPY WITH MEDIAL MENISECTOMY Left 08/24/2013   Procedure: LEFT KNEE ARTHROSCOPY WITH MEDIAL MENISCECTOMY  ;  Surgeon: Nilda Simmerobert A Wainer, MD;  Location: [facility] OR;  Service: Orthopedics;  Laterality: Left;  . PARTIAL KNEE ARTHROPLASTY Left 08/24/2013   dr Denton Arwanier  . PARTIAL KNEE ARTHROPLASTY Left 08/24/2013   Procedure: LEFT KNEE ARTHROPLASTY UNI COMPARTMENTAL ;   Surgeon: Nilda Simmerobert A Wainer, MD;  Location: [facility] OR;  Service: Orthopedics;  Laterality: Left;  Left knee arthroplasty uni compartment  With left knee scope medial meniscectomy    . TOTAL VAGINAL HYSTERECTOMY      Current Outpatient Prescriptions  Medication Sig Dispense Refill  . acetaminophen (TYLENOL) 325 MG tablet Take 2 tablets (650 mg total) by mouth every 6 (six) hours as needed for mild pain (or Fever >/= 101).    Marland Kitchen. albuterol (PROVENTIL HFA;VENTOLIN HFA) 108 (90 BASE) MCG/ACT inhaler Inhale 1 puff into the lungs every 6 (six) hours as needed for wheezing or shortness of breath.    . ALPRAZolam (XANAX) 0.5 MG tablet Take 0.5 mg by mouth at bedtime as needed for anxiety.    Marland Kitchen. amitriptyline (ELAVIL) 50 MG tablet Take 50 mg by mouth at bedtime.    . Cholecalciferol (VITAMIN D) 2000 UNITS CAPS Take 2,000 Units by mouth daily.    Marland Kitchen. gabapentin (NEURONTIN) 300 MG capsule Take 300 mg by mouth daily.     Marland Kitchen. levothyroxine (SYNTHROID, LEVOTHROID) 88 MCG tablet Take 88 mcg by mouth daily before breakfast.    . sulfamethoxazole-trimethoprim (BACTRIM DS,SEPTRA DS) 800-160 MG tablet 2 (two) times daily.     No current facility-administered medications for this visit.    Allergies:  Patient has no known allergies.   Social History: The patient  reports that she quit smoking about 13 years ago. Her smoking use included Cigarettes. She has a 45.00 pack-year smoking history. She has never used smokeless tobacco. She  reports that she does not drink alcohol or use drugs.   Family History: The patient's family history includes Heart attack in her father; Heart disease in her father; Hypertension in her father; Stroke in her mother.   ROS:  Please see the history of present illness. Otherwise, complete review of systems is positive for insomnia.  All other systems are reviewed and negative.   Physical Exam: VS:  BP 132/74 (BP Location: Left Arm, Cuff Size: Normal)   Pulse 87   Ht 5' (1.524 m)   Wt 134 lb 6.4  oz (61 kg)   BMI 26.25 kg/m , BMI Body mass index is 26.25 kg/m.  Wt Readings from Last 3 Encounters:  07/04/16 134 lb 6.4 oz (61 kg)  08/14/13 135 lb 8 oz (61.5 kg)  08/12/13 137 lb (62.1 kg)    General: Patient appears comfortable at rest. HEENT: Conjunctiva and lids normal, oropharynx clear. Neck: Supple, no elevated JVP or carotid bruits, no thyromegaly. Lungs: Diminished breath sounds without wheezing, nonlabored breathing at rest. Cardiac: Regular rate and rhythm, no S3 or significant systolic murmur, no pericardial rub. Abdomen: Soft, nontender, bowel sounds present, no guarding or rebound. Extremities: No pitting edema, distal pulses 2+. Skin: Warm and dry. Musculoskeletal: No kyphosis. Neuropsychiatric: Alert and oriented x3, affect grossly appropriate.  ECG: I personally reviewed the tracing from 08/14/2013 which showed normal sinus rhythm.  Recent Labwork: 04/12/2016: ALT 23; AST 28; BUN 9; Creatinine, Ser 0.87; Hemoglobin 11.6; Platelets 367; Potassium 4.1; Sodium 139   Other Studies Reviewed Today:  Echocardiogram 06/07/2016: Study Conclusions  - Left ventricle: The cavity size was normal. Wall thickness was   normal. Systolic function was normal. The estimated ejection   fraction was in the range of 60% to 65%. Wall motion was normal;   there were no regional wall motion abnormalities. Doppler   parameters are consistent with high ventricular filling pressure.   Diastolic dysfunction, grade indeterminate. - Mitral valve: There was mild regurgitation.  Chest x-ray 04/12/2016: FINDINGS: Normal heart size, mediastinal contours, and pulmonary vascularity.  Minimal scarring at LEFT base.  Improved LEFT mid lung opacity likely representing infiltrate on previous exam.  No new infiltrate, pleural effusion or pneumothorax.  Bones demineralized with scattered endplate spur formation thoracic spine.  IMPRESSION: Improved LEFT mid lung infiltrate since previous  exam.  No new abnormalities.  Assessment and Plan:  1. Dyspnea on exertion, possibly multifactorial with COPD and evidence of diastolic dysfunction. She does have cardiac risk factors including prior tobacco use and family history of premature CAD. Plan is to obtain a Lexiscan Myoview for more objective ischemic assessment. If low risk would continue with regular follow-up per Dr. Karilyn Cota for risk factor modification and further consideration of sleep testing with concerns about obstructive sleep apnea.  2. At risk for obstructive sleep apnea, sleep study pending per Dr. Karilyn Cota.  3. COPD history of recurrent pneumonia. She is following with Dr. Karilyn Cota.  Current medicines were reviewed with the patient today.   Orders Placed This Encounter  Procedures  . NM Myocar Multi W/Spect W/Wall Motion / EF  . EKG 12-Lead    Disposition: Call with test results.  Signed, Jonelle Sidle, MD, [facility] 07/04/2016 10:49 AM    [facility] at [facility] [address] [place], [place] [address] Phone: [phone]; Fax: [phone]

## 2016-07-04 ENCOUNTER — Encounter: Payer: Self-pay | Admitting: Cardiology

## 2016-07-04 ENCOUNTER — Ambulatory Visit (INDEPENDENT_AMBULATORY_CARE_PROVIDER_SITE_OTHER): Payer: Medicare Other | Admitting: Cardiology

## 2016-07-04 ENCOUNTER — Encounter: Payer: Self-pay | Admitting: *Deleted

## 2016-07-04 VITALS — BP 132/74 | HR 87 | Ht 60.0 in | Wt 134.4 lb

## 2016-07-04 DIAGNOSIS — R0602 Shortness of breath: Secondary | ICD-10-CM | POA: Diagnosis not present

## 2016-07-04 DIAGNOSIS — Z9189 Other specified personal risk factors, not elsewhere classified: Secondary | ICD-10-CM

## 2016-07-04 DIAGNOSIS — J449 Chronic obstructive pulmonary disease, unspecified: Secondary | ICD-10-CM | POA: Diagnosis not present

## 2016-07-04 NOTE — Patient Instructions (Signed)
Your physician recommends that you schedule a follow-up appointment : We will call with results.   Your physician recommends that you continue on your current medications as directed. Please refer to the Current Medication list given to you today.  Your physician has requested that you have a lexiscan myoview. For further information please visit [email or web address]. Please follow instruction sheet, as given.  If you need a refill on your cardiac medications before your next appointment, please call your pharmacy.  Thank you for choosing [facility]!

## 2016-07-12 ENCOUNTER — Inpatient Hospital Stay (HOSPITAL_COMMUNITY): Admission: RE | Admit: 2016-07-12 | Payer: Medicare Other | Source: Ambulatory Visit

## 2016-07-12 ENCOUNTER — Encounter (HOSPITAL_COMMUNITY): Payer: Self-pay

## 2016-07-12 ENCOUNTER — Encounter (HOSPITAL_COMMUNITY)
Admission: RE | Admit: 2016-07-12 | Discharge: 2016-07-12 | Disposition: A | Discharge status: Home / Self Care | Payer: Medicare Other | Source: Ambulatory Visit | Attending: Cardiology | Admitting: Cardiology

## 2016-07-12 ENCOUNTER — Other Ambulatory Visit (HOSPITAL_BASED_OUTPATIENT_CLINIC_OR_DEPARTMENT_OTHER): Payer: Self-pay

## 2016-07-12 DIAGNOSIS — R0602 Shortness of breath: Secondary | ICD-10-CM | POA: Insufficient documentation

## 2016-07-12 DIAGNOSIS — G4700 Insomnia, unspecified: Secondary | ICD-10-CM

## 2016-07-12 DIAGNOSIS — R5383 Other fatigue: Secondary | ICD-10-CM

## 2016-07-12 DIAGNOSIS — R0683 Snoring: Secondary | ICD-10-CM

## 2016-07-12 LAB — NM MYOCAR MULTI W/SPECT W/WALL MOTION / EF
LV dias vol: 30 mL (ref 46–106)
LVSYSVOL: 7 mL
NUC STRESS TID: 1.07
Peak HR: 100 {beats}/min
RATE: 0.33
Rest HR: 74 {beats}/min
SDS: 3
SRS: 3
SSS: 6

## 2016-07-12 MED ORDER — TECHNETIUM TC 99M TETROFOSMIN IV KIT
30.0000 | PACK | Freq: Once | INTRAVENOUS | Status: AC | PRN
  Administered 2016-07-12: 32 via INTRAVENOUS

## 2016-07-12 MED ORDER — REGADENOSON 0.4 MG/5ML IV SOLN
INTRAVENOUS | Status: AC
  Administered 2016-07-12: 0.4 mg via INTRAVENOUS
  Filled 2016-07-12: qty 5

## 2016-07-12 MED ORDER — TECHNETIUM TC 99M TETROFOSMIN IV KIT
10.0000 | PACK | Freq: Once | INTRAVENOUS | Status: AC | PRN
  Administered 2016-07-12: 10.5 via INTRAVENOUS

## 2016-07-12 MED ORDER — SODIUM CHLORIDE 0.9% FLUSH
INTRAVENOUS | Status: AC
  Filled 2016-07-12: qty 180

## 2016-07-12 MED ORDER — SODIUM CHLORIDE 0.9% FLUSH
INTRAVENOUS | Status: AC
  Administered 2016-07-12: 10 mL via INTRAVENOUS
  Filled 2016-07-12: qty 10

## 2016-07-20 ENCOUNTER — Ambulatory Visit (HOSPITAL_BASED_OUTPATIENT_CLINIC_OR_DEPARTMENT_OTHER): Payer: Medicare Other | Attending: Internal Medicine | Admitting: Internal Medicine

## 2016-07-20 VITALS — Ht 60.0 in | Wt 134.0 lb

## 2016-07-20 DIAGNOSIS — G2581 Restless legs syndrome: Secondary | ICD-10-CM | POA: Insufficient documentation

## 2016-07-20 DIAGNOSIS — G4733 Obstructive sleep apnea (adult) (pediatric): Secondary | ICD-10-CM | POA: Diagnosis not present

## 2016-07-20 DIAGNOSIS — R0683 Snoring: Secondary | ICD-10-CM

## 2016-07-20 DIAGNOSIS — G4700 Insomnia, unspecified: Secondary | ICD-10-CM

## 2016-07-20 DIAGNOSIS — R5383 Other fatigue: Secondary | ICD-10-CM

## 2016-07-29 DIAGNOSIS — R0683 Snoring: Secondary | ICD-10-CM

## 2016-07-29 NOTE — Procedures (Signed)
  Patient Name: [name] [name], [name] [name] Date: 07/20/2016 Gender: Female D.O.B: 1945-11-23 Age (years): 70 Referring Provider: Lilly Cove Height (inches): 60 Interpreting Physician: Jetty Duhamel MD, ABSM Weight (lbs): 134 RPSGT: Cherylann Parr BMI: 26 MRN: [id number] Neck Size: 14.50 CLINICAL INFORMATION Sleep Study Type: NPSG     Indication for sleep study: Fatigue, Insomnia, Obesity, Snoring, Witnesses Apnea / Gasping During Sleep     Epworth Sleepiness Score:  2/24     SLEEP STUDY TECHNIQUE As per the AASM Manual for the Scoring of Sleep and Associated Events v2.3 (April 2016) with a hypopnea requiring 4% desaturations.  The channels recorded and monitored were frontal, central and occipital EEG, electrooculogram (EOG), submentalis EMG (chin), nasal and oral airflow, thoracic and abdominal wall motion, anterior tibialis EMG, snore microphone, electrocardiogram, and pulse oximetry.  MEDICATIONS Medications self-administered by patient taken the night of the study : GABAPENTIN, LEVOTHYROXINE, AMITRIPTYLINE  SLEEP ARCHITECTURE The study was initiated at 10:17:33 PM and ended at 4:38:30 AM.  Sleep onset time was 20.5 minutes and the sleep efficiency was 67.2%. The total sleep time was 256.0 minutes.  Stage REM latency was 212.5 minutes.  The patient spent 6.05% of the night in stage N1 sleep, 81.64% in stage N2 sleep, 0.00% in stage N3 and 12.30% in REM.  Alpha intrusion was absent.  Supine sleep was 22.32%.  RESPIRATORY PARAMETERS The overall apnea/hypopnea index (AHI) was 14.8 per hour. There were 23 total apneas, including 22 obstructive, 1 central and 0 mixed apneas. There were 40 hypopneas and 0 RERAs.  The AHI during Stage REM sleep was 68.6 per hour.  AHI while supine was 9.4 per hour.  The mean oxygen saturation was 92.61%. The minimum SpO2 during sleep was 74.00%.  None snoring was noted during this study.  CARDIAC DATA The 2 lead EKG demonstrated  sinus rhythm. The mean heart rate was 78.98 beats per minute. Other EKG findings include: None.  LEG MOVEMENT DATA The total PLMS were 169 with a resulting PLMS index of 39.61. Associated arousal with leg movement index was 1.6 .  IMPRESSIONS - Mild to moderate obstructive sleep apnea occurred during this study (AHI = 14.8/h). - No significant central sleep apnea occurred during this study (CAI = 0.2/h). - Moderate oxygen desaturation was noted during this study (Min O2 = 74.00%). - Loud snoring was audible during this study. - No cardiac abnormalities were noted during this study. - Moderate periodic limb movements of sleep occurred during the study. No significant associated arousals. - Patient had difficulty initiating and maintaining sleep.  DIAGNOSIS - Obstructive Sleep Apnea (327.23 [G47.33 ICD-10]) - Nocturnal Hypoxemia (327.26 [G47.36 ICD-10])  RECOMMENDATIONS - Therapeutic CPAP titration to determine optimal pressure required to alleviate sleep disordered breathing. - Avoid alcohol, sedatives and other CNS depressants that may worsen sleep apnea and disrupt normal sleep architecture. - Sleep hygiene should be reviewed to assess factors that may improve sleep quality. - Weight management and regular exercise should be initiated or continued if appropriate.  [Electronically signed] 07/29/2016 11:29 AM  Jetty Duhamel MD, ABSM Diplomate, American Board of Sleep Medicine   NPI: [id number]  Waymon Budge Diplomate, American Board of Sleep Medicine  ELECTRONICALLY SIGNED ON:  07/29/2016, 11:26 AM [facility] PH: [phone]   FX: [phone] ACCREDITED BY THE AMERICAN ACADEMY OF SLEEP MEDICINE

## 2016-07-30 DIAGNOSIS — R0600 Dyspnea, unspecified: Secondary | ICD-10-CM | POA: Diagnosis not present

## 2016-07-30 DIAGNOSIS — E039 Hypothyroidism, unspecified: Secondary | ICD-10-CM | POA: Diagnosis not present

## 2016-07-30 DIAGNOSIS — F331 Major depressive disorder, recurrent, moderate: Secondary | ICD-10-CM | POA: Diagnosis not present

## 2016-09-04 DIAGNOSIS — G4736 Sleep related hypoventilation in conditions classified elsewhere: Secondary | ICD-10-CM | POA: Diagnosis not present

## 2016-09-04 DIAGNOSIS — G4733 Obstructive sleep apnea (adult) (pediatric): Secondary | ICD-10-CM | POA: Diagnosis not present

## 2016-09-04 DIAGNOSIS — R0600 Dyspnea, unspecified: Secondary | ICD-10-CM | POA: Diagnosis not present

## 2016-09-04 DIAGNOSIS — G4700 Insomnia, unspecified: Secondary | ICD-10-CM | POA: Diagnosis not present

## 2016-10-30 DIAGNOSIS — R0600 Dyspnea, unspecified: Secondary | ICD-10-CM | POA: Diagnosis not present

## 2016-10-30 DIAGNOSIS — E039 Hypothyroidism, unspecified: Secondary | ICD-10-CM | POA: Diagnosis not present

## 2016-10-30 DIAGNOSIS — E559 Vitamin D deficiency, unspecified: Secondary | ICD-10-CM | POA: Diagnosis not present

## 2016-11-06 DIAGNOSIS — M810 Age-related osteoporosis without current pathological fracture: Secondary | ICD-10-CM | POA: Diagnosis not present

## 2016-11-06 DIAGNOSIS — F331 Major depressive disorder, recurrent, moderate: Secondary | ICD-10-CM | POA: Diagnosis not present

## 2016-11-06 DIAGNOSIS — E039 Hypothyroidism, unspecified: Secondary | ICD-10-CM | POA: Diagnosis not present

## 2016-11-08 DIAGNOSIS — E039 Hypothyroidism, unspecified: Secondary | ICD-10-CM | POA: Diagnosis not present

## 2016-12-24 DIAGNOSIS — G4700 Insomnia, unspecified: Secondary | ICD-10-CM | POA: Diagnosis not present

## 2016-12-24 DIAGNOSIS — G4733 Obstructive sleep apnea (adult) (pediatric): Secondary | ICD-10-CM | POA: Diagnosis not present

## 2016-12-24 DIAGNOSIS — R0902 Hypoxemia: Secondary | ICD-10-CM | POA: Diagnosis not present

## 2016-12-27 DIAGNOSIS — N390 Urinary tract infection, site not specified: Secondary | ICD-10-CM | POA: Diagnosis not present

## 2016-12-27 DIAGNOSIS — J019 Acute sinusitis, unspecified: Secondary | ICD-10-CM | POA: Diagnosis not present

## 2017-01-24 DIAGNOSIS — R0902 Hypoxemia: Secondary | ICD-10-CM | POA: Diagnosis not present

## 2017-01-24 DIAGNOSIS — J449 Chronic obstructive pulmonary disease, unspecified: Secondary | ICD-10-CM | POA: Diagnosis not present

## 2017-02-11 DIAGNOSIS — E039 Hypothyroidism, unspecified: Secondary | ICD-10-CM | POA: Diagnosis not present

## 2017-02-11 DIAGNOSIS — E559 Vitamin D deficiency, unspecified: Secondary | ICD-10-CM | POA: Diagnosis not present

## 2017-02-11 DIAGNOSIS — M810 Age-related osteoporosis without current pathological fracture: Secondary | ICD-10-CM | POA: Diagnosis not present

## 2017-02-11 DIAGNOSIS — Z23 Encounter for immunization: Secondary | ICD-10-CM | POA: Diagnosis not present

## 2017-03-20 DIAGNOSIS — R0600 Dyspnea, unspecified: Secondary | ICD-10-CM | POA: Diagnosis not present

## 2017-03-20 DIAGNOSIS — J209 Acute bronchitis, unspecified: Secondary | ICD-10-CM | POA: Diagnosis not present

## 2017-04-23 DIAGNOSIS — J019 Acute sinusitis, unspecified: Secondary | ICD-10-CM | POA: Diagnosis not present

## 2017-04-23 DIAGNOSIS — E039 Hypothyroidism, unspecified: Secondary | ICD-10-CM | POA: Diagnosis not present

## 2017-06-25 DIAGNOSIS — H2513 Age-related nuclear cataract, bilateral: Secondary | ICD-10-CM | POA: Diagnosis not present

## 2017-06-25 DIAGNOSIS — H16143 Punctate keratitis, bilateral: Secondary | ICD-10-CM | POA: Diagnosis not present

## 2017-07-02 ENCOUNTER — Other Ambulatory Visit (HOSPITAL_COMMUNITY): Payer: Self-pay | Admitting: Pulmonary Disease

## 2017-07-02 DIAGNOSIS — Z1231 Encounter for screening mammogram for malignant neoplasm of breast: Secondary | ICD-10-CM

## 2017-07-09 DIAGNOSIS — N390 Urinary tract infection, site not specified: Secondary | ICD-10-CM | POA: Diagnosis not present

## 2017-07-17 ENCOUNTER — Ambulatory Visit (HOSPITAL_COMMUNITY): Payer: Medicare Other

## 2017-08-21 DIAGNOSIS — H16143 Punctate keratitis, bilateral: Secondary | ICD-10-CM | POA: Diagnosis not present

## 2017-08-22 DIAGNOSIS — E039 Hypothyroidism, unspecified: Secondary | ICD-10-CM | POA: Diagnosis not present

## 2017-09-04 DIAGNOSIS — E039 Hypothyroidism, unspecified: Secondary | ICD-10-CM | POA: Diagnosis not present

## 2017-09-20 DIAGNOSIS — J209 Acute bronchitis, unspecified: Secondary | ICD-10-CM | POA: Diagnosis not present

## 2017-10-09 DIAGNOSIS — N390 Urinary tract infection, site not specified: Secondary | ICD-10-CM | POA: Diagnosis not present

## 2017-10-09 DIAGNOSIS — R030 Elevated blood-pressure reading, without diagnosis of hypertension: Secondary | ICD-10-CM | POA: Diagnosis not present

## 2017-10-15 DIAGNOSIS — N390 Urinary tract infection, site not specified: Secondary | ICD-10-CM | POA: Diagnosis not present

## 2017-12-11 DIAGNOSIS — M810 Age-related osteoporosis without current pathological fracture: Secondary | ICD-10-CM | POA: Diagnosis not present

## 2017-12-11 DIAGNOSIS — G4709 Other insomnia: Secondary | ICD-10-CM | POA: Diagnosis not present

## 2017-12-11 DIAGNOSIS — E039 Hypothyroidism, unspecified: Secondary | ICD-10-CM | POA: Diagnosis not present

## 2017-12-24 ENCOUNTER — Other Ambulatory Visit (HOSPITAL_COMMUNITY): Payer: Self-pay | Admitting: Respiratory Therapy

## 2017-12-24 DIAGNOSIS — R0602 Shortness of breath: Secondary | ICD-10-CM

## 2017-12-24 DIAGNOSIS — G4700 Insomnia, unspecified: Secondary | ICD-10-CM | POA: Diagnosis not present

## 2017-12-24 DIAGNOSIS — G4733 Obstructive sleep apnea (adult) (pediatric): Secondary | ICD-10-CM | POA: Diagnosis not present

## 2017-12-24 DIAGNOSIS — F17200 Nicotine dependence, unspecified, uncomplicated: Secondary | ICD-10-CM | POA: Diagnosis not present

## 2017-12-25 ENCOUNTER — Other Ambulatory Visit (HOSPITAL_COMMUNITY): Payer: Self-pay | Admitting: Respiratory Therapy

## 2018-01-22 ENCOUNTER — Ambulatory Visit (HOSPITAL_COMMUNITY): Admission: RE | Admit: 2018-01-22 | Payer: Medicare Other | Source: Ambulatory Visit

## 2018-02-06 DIAGNOSIS — Z23 Encounter for immunization: Secondary | ICD-10-CM | POA: Diagnosis not present

## 2018-04-24 DIAGNOSIS — M810 Age-related osteoporosis without current pathological fracture: Secondary | ICD-10-CM | POA: Diagnosis not present

## 2018-04-24 DIAGNOSIS — E039 Hypothyroidism, unspecified: Secondary | ICD-10-CM | POA: Diagnosis not present

## 2018-04-24 DIAGNOSIS — G4709 Other insomnia: Secondary | ICD-10-CM | POA: Diagnosis not present

## 2018-04-24 DIAGNOSIS — R6882 Decreased libido: Secondary | ICD-10-CM | POA: Diagnosis not present

## 2018-04-24 DIAGNOSIS — Z780 Asymptomatic menopausal state: Secondary | ICD-10-CM | POA: Diagnosis not present

## 2018-04-28 DIAGNOSIS — F331 Major depressive disorder, recurrent, moderate: Secondary | ICD-10-CM | POA: Diagnosis not present

## 2018-04-28 DIAGNOSIS — M810 Age-related osteoporosis without current pathological fracture: Secondary | ICD-10-CM | POA: Diagnosis not present

## 2018-04-28 DIAGNOSIS — E559 Vitamin D deficiency, unspecified: Secondary | ICD-10-CM | POA: Diagnosis not present

## 2018-04-28 DIAGNOSIS — E039 Hypothyroidism, unspecified: Secondary | ICD-10-CM | POA: Diagnosis not present

## 2018-06-02 DIAGNOSIS — F331 Major depressive disorder, recurrent, moderate: Secondary | ICD-10-CM | POA: Diagnosis not present

## 2018-06-02 DIAGNOSIS — M810 Age-related osteoporosis without current pathological fracture: Secondary | ICD-10-CM | POA: Diagnosis not present

## 2018-06-02 DIAGNOSIS — E039 Hypothyroidism, unspecified: Secondary | ICD-10-CM | POA: Diagnosis not present

## 2018-06-02 DIAGNOSIS — J019 Acute sinusitis, unspecified: Secondary | ICD-10-CM | POA: Diagnosis not present

## 2018-06-12 ENCOUNTER — Encounter (INDEPENDENT_AMBULATORY_CARE_PROVIDER_SITE_OTHER): Payer: Self-pay | Admitting: Nurse Practitioner

## 2018-07-10 DIAGNOSIS — Z139 Encounter for screening, unspecified: Secondary | ICD-10-CM | POA: Diagnosis not present

## 2018-07-10 DIAGNOSIS — E039 Hypothyroidism, unspecified: Secondary | ICD-10-CM | POA: Diagnosis not present

## 2018-07-10 DIAGNOSIS — Z23 Encounter for immunization: Secondary | ICD-10-CM | POA: Diagnosis not present

## 2018-07-10 DIAGNOSIS — Z7189 Other specified counseling: Secondary | ICD-10-CM | POA: Diagnosis not present

## 2018-08-07 DIAGNOSIS — E039 Hypothyroidism, unspecified: Secondary | ICD-10-CM | POA: Diagnosis not present

## 2018-08-17 IMAGING — NM NM MYOCAR MULTI W/SPECT W/WALL MOTION & EF
2 series · 12 of 12 positions shown · non-contrast
Comparison: none

[Series 1: rest · 8.28mm/px · 6 of 64 frames shown]
[frame 6/64]
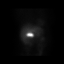
[frame 16/64]
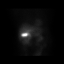
[frame 27/64]
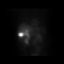
[frame 38/64]
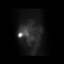
[frame 48/64]
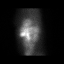
[frame 59/64]
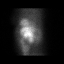

[Series 2: stress gated · 8.28mm/px · 6 of 64 frames shown]
[frame 6/64]
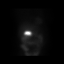
[frame 16/64]
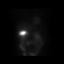
[frame 27/64]
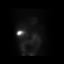
[frame 38/64]
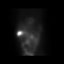
[frame 48/64]
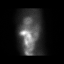
[frame 59/64]
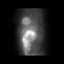

[12 of 12 positions shown; findings below may reference images not displayed]

Canned report from images found in remote index.

Refer to host system for actual result text.

## 2018-08-19 DIAGNOSIS — E039 Hypothyroidism, unspecified: Secondary | ICD-10-CM | POA: Diagnosis not present

## 2018-09-25 DIAGNOSIS — G4733 Obstructive sleep apnea (adult) (pediatric): Secondary | ICD-10-CM | POA: Diagnosis not present

## 2018-12-18 ENCOUNTER — Encounter (INDEPENDENT_AMBULATORY_CARE_PROVIDER_SITE_OTHER): Payer: Self-pay | Admitting: Internal Medicine

## 2018-12-18 ENCOUNTER — Ambulatory Visit (INDEPENDENT_AMBULATORY_CARE_PROVIDER_SITE_OTHER): Payer: Medicare HMO | Admitting: Internal Medicine

## 2018-12-18 ENCOUNTER — Other Ambulatory Visit: Payer: Self-pay

## 2018-12-18 ENCOUNTER — Other Ambulatory Visit (INDEPENDENT_AMBULATORY_CARE_PROVIDER_SITE_OTHER): Payer: Self-pay | Admitting: Internal Medicine

## 2018-12-18 VITALS — BP 160/70 | HR 72 | Ht 59.0 in | Wt 137.8 lb

## 2018-12-18 DIAGNOSIS — F3289 Other specified depressive episodes: Secondary | ICD-10-CM

## 2018-12-18 DIAGNOSIS — F5105 Insomnia due to other mental disorder: Secondary | ICD-10-CM

## 2018-12-18 DIAGNOSIS — E559 Vitamin D deficiency, unspecified: Secondary | ICD-10-CM | POA: Insufficient documentation

## 2018-12-18 DIAGNOSIS — J449 Chronic obstructive pulmonary disease, unspecified: Secondary | ICD-10-CM | POA: Diagnosis not present

## 2018-12-18 DIAGNOSIS — F99 Mental disorder, not otherwise specified: Secondary | ICD-10-CM | POA: Diagnosis not present

## 2018-12-18 DIAGNOSIS — Z1239 Encounter for other screening for malignant neoplasm of breast: Secondary | ICD-10-CM

## 2018-12-18 DIAGNOSIS — E039 Hypothyroidism, unspecified: Secondary | ICD-10-CM | POA: Diagnosis not present

## 2018-12-18 DIAGNOSIS — M1712 Unilateral primary osteoarthritis, left knee: Secondary | ICD-10-CM

## 2018-12-18 DIAGNOSIS — G4700 Insomnia, unspecified: Secondary | ICD-10-CM

## 2018-12-18 HISTORY — DX: Insomnia, unspecified: G47.00

## 2018-12-18 MED ORDER — GABAPENTIN 300 MG PO CAPS: 600.0000 mg | ORAL_CAPSULE | Freq: Every day | ORAL | 0 refills | Status: DC

## 2018-12-18 MED ORDER — THYROID 120 MG PO TABS: 120.0000 mg | ORAL_TABLET | Freq: Every day | ORAL | 0 refills | Status: DC

## 2018-12-18 MED ORDER — ALPRAZOLAM 0.5 MG PO TABS: 0.5000 mg | ORAL_TABLET | Freq: Every evening | ORAL | 3 refills | Status: DC | PRN

## 2018-12-18 NOTE — Progress Notes (Signed)
Subjective:  Patient ID: [name] [name], female    DOB: July 13, 1945  Age: 73 y.o. MRN: [id number]  CC: This lady comes in for follow-up of her multiple medical problems including COPD, depression, hypothyroidism, osteoarthritis.  HPI Lately, she is concerned about her worsening symptoms with COPD.  She quit smoking many years ago but feels that she gets more tired and dyspneic on exertion.  She does not use any baseline inhalers at the present time except albuterol when she needs to. She continues to take desiccated NP thyroid for hypothyroidism and seems to be tolerating this.  Previous lab work showed optimal T3 levels. Her osteoarthritis is causing her trouble and she takes Tylenol for this.  This seems to help her. We have never checked vitamin D levels but I suspect they have been low in the past.   Past Medical History:  Diagnosis Date  . COPD (chronic obstructive pulmonary disease) ([place])   . Depression   . History of pneumonia    2010 on ventalator  . History of recurrent UTIs   . Hypothyroidism   . Insomnia 12/18/2018  . Left knee DJD   . PTSD (post-traumatic stress disorder)   . Restless leg syndrome      Social History   Social History Narrative  . Not on file   Social History   Substance and Sexual Activity  Alcohol Use No    Married  Data Unavailable   Current Meds  Medication Sig  . acetaminophen (TYLENOL) 325 MG tablet Take 2 tablets (650 mg total) by mouth every 6 (six) hours as needed for mild pain (or Fever >/= 101).  Marland Kitchen ALPRAZolam (XANAX) 0.5 MG tablet Take 1 tablet (0.5 mg total) by mouth at bedtime as needed for anxiety.  Marland Kitchen amitriptyline (ELAVIL) 50 MG tablet Take 50 mg by mouth at bedtime.  . gabapentin (NEURONTIN) 300 MG capsule Take 2 capsules (600 mg total) by mouth at bedtime.  Marland Kitchen thyroid (NP THYROID) 120 MG tablet Take 1 tablet (120 mg total) by mouth daily before breakfast.  . [DISCONTINUED] ALPRAZolam (XANAX) 0.5 MG tablet Take 0.5 mg  by mouth at bedtime as needed for anxiety.  . [DISCONTINUED] gabapentin (NEURONTIN) 300 MG capsule Take 600 mg by mouth at bedtime.   . [DISCONTINUED] thyroid (NP THYROID) 120 MG tablet Take 120 mg by mouth daily before breakfast.       Objective:   Today's Vitals: BP (!) 160/70   Pulse 72   Ht 4\' 11"  (1.499 m)   Wt 137 lb 12.8 oz (62.5 kg)   BMI 27.83 kg/m  Vitals with BMI 12/18/2018 07/20/2016 07/04/2016  Height 4\' 11"  5\' 0"  -  Weight 137 lbs 13 oz 134 lbs -  BMI 51.02 58.5 -  Systolic 277 - 824  Diastolic 70 - 74  Pulse 72 - 87       Physical Exam  She looks systemically well.  Blood pressure somewhat elevated today.  This is not usual for her and we will need to monitor it.  She is alert and orientated without any focal neurological signs.  Assessment     1. Acquired hypothyroidism   2. Primary osteoarthritis of left knee   3. Chronic obstructive pulmonary disease, unspecified COPD type ([place])   4. Other depression   5. Insomnia due to other mental disorder   6. Vitamin D deficiency disease   7. Screening for breast cancer       Plan 1. She will  continue with all medications for chronic conditions above. 2. Blood work is ordered as below. 3. I will refer her to pulmonology for further evaluation and treatment of her COPD. 4. She also needs a screening mammogram and we will arrange this. 5. Further recommendations will depend on blood results and I will see her in about 3 months time for follow-up.   Tests Ordered:   Orders Placed This Encounter  Procedures  . MM Digital Screening  . VITAMIN D 25 Hydroxy (Vit-D Deficiency, Fractures)  . COMPLETE METABOLIC PANEL WITH GFR  . Ambulatory referral to Pulmonology     Wilson SingerNimish C Arvis Miguez, MD

## 2018-12-19 LAB — COMPLETE METABOLIC PANEL WITH GFR
AG Ratio: 1.9 (calc) (ref 1.0–2.5)
ALT: 26 U/L (ref 6–29)
AST: 27 U/L (ref 10–35)
Albumin: 4.5 g/dL (ref 3.6–5.1)
Alkaline phosphatase (APISO): 126 U/L (ref 37–153)
BUN: 20 mg/dL (ref 7–25)
CO2: 18 mmol/L — ABNORMAL LOW (ref 20–32)
Calcium: 10.4 mg/dL (ref 8.6–10.4)
Chloride: 107 mmol/L (ref 98–110)
Creat: 0.83 mg/dL (ref 0.60–0.93)
GFR, Est African American: 83 mL/min/{1.73_m2} (ref >=60)
GFR, Est Non African American: 71 mL/min/{1.73_m2} (ref >=60)
Globulin: 2.4 g/dL (calc) (ref 1.9–3.7)
Glucose, Bld: 88 mg/dL (ref 65–99)
Potassium: 4.4 mmol/L (ref 3.5–5.3)
Sodium: 139 mmol/L (ref 135–146)
Total Bilirubin: 0.3 mg/dL (ref 0.2–1.2)
Total Protein: 6.9 g/dL (ref 6.1–8.1)

## 2018-12-19 LAB — VITAMIN D 25 HYDROXY (VIT D DEFICIENCY, FRACTURES): Vit D, 25-Hydroxy: 29 ng/mL — ABNORMAL LOW (ref 30–100)

## 2019-01-21 ENCOUNTER — Encounter (INDEPENDENT_AMBULATORY_CARE_PROVIDER_SITE_OTHER): Payer: Self-pay | Admitting: Internal Medicine

## 2019-01-21 ENCOUNTER — Telehealth (INDEPENDENT_AMBULATORY_CARE_PROVIDER_SITE_OTHER): Payer: Self-pay | Admitting: Internal Medicine

## 2019-01-21 ENCOUNTER — Ambulatory Visit (INDEPENDENT_AMBULATORY_CARE_PROVIDER_SITE_OTHER): Payer: Medicare HMO | Admitting: Internal Medicine

## 2019-01-21 ENCOUNTER — Other Ambulatory Visit: Payer: Self-pay

## 2019-01-21 VITALS — BP 160/80 | HR 72 | Ht 60.0 in | Wt 136.2 lb

## 2019-01-21 DIAGNOSIS — N3000 Acute cystitis without hematuria: Secondary | ICD-10-CM | POA: Diagnosis not present

## 2019-01-21 DIAGNOSIS — I10 Essential (primary) hypertension: Secondary | ICD-10-CM | POA: Diagnosis not present

## 2019-01-21 DIAGNOSIS — Z23 Encounter for immunization: Secondary | ICD-10-CM | POA: Diagnosis not present

## 2019-01-21 HISTORY — DX: Essential (primary) hypertension: I10

## 2019-01-21 MED ORDER — AMLODIPINE BESYLATE 5 MG PO TABS: 5.0000 mg | ORAL_TABLET | Freq: Every day | ORAL | 3 refills | Status: DC

## 2019-01-21 MED ORDER — CIPROFLOXACIN HCL 500 MG PO TABS: 500.0000 mg | ORAL_TABLET | Freq: Two times a day (BID) | ORAL | 1 refills | Status: DC

## 2019-01-21 NOTE — Addendum Note (Signed)
Addended by: Doree Albee on: 01/21/2019 10:58 AM   Modules accepted: Level of Service

## 2019-01-21 NOTE — Telephone Encounter (Signed)
That will need to be an office visit, see if you can fit her in.  Thanks

## 2019-01-21 NOTE — Progress Notes (Addendum)
Wellness Office Visit  Subjective:  Patient ID: [name] [name], female    DOB: 1945-06-13  Age: 73 y.o. MRN: [id number]  CC: This lady comes in as an acute visit with a 2-day history of urinary urgency, frequency. HPI  As mentioned above, the history of of 2 days.  She denies any fever, nausea or vomiting or abdominal pain.  She does not have dysuria at the present time.  She has had symptoms like this before when she has had urine infections.  Past Medical History:  Diagnosis Date  . COPD (chronic obstructive pulmonary disease) (HCC)   . Depression   . Essential hypertension, benign 01/21/2019  . History of pneumonia    2010 on ventalator  . History of recurrent UTIs   . Hypothyroidism   . Insomnia 12/18/2018  . Left knee DJD   . PTSD (post-traumatic stress disorder)   . Restless leg syndrome       Family History  Problem Relation Age of Onset  . Stroke Mother   . Heart disease Father   . Heart attack Father   . Hypertension Father     Social History   Social History Narrative   Married 22 years in 2nd marriage.First marriage lasted 20 years.Retired,now a Education officer, environmental.     Current Meds  Medication Sig  . acetaminophen (TYLENOL) 325 MG tablet Take 2 tablets (650 mg total) by mouth every 6 (six) hours as needed for mild pain (or Fever >/= 101).  Marland Kitchen albuterol (PROVENTIL HFA;VENTOLIN HFA) 108 (90 BASE) MCG/ACT inhaler Inhale 1 puff into the lungs every 6 (six) hours as needed for wheezing or shortness of breath.  . ALPRAZolam (XANAX) 0.5 MG tablet Take 1 tablet (0.5 mg total) by mouth at bedtime as needed for anxiety.  Marland Kitchen amitriptyline (ELAVIL) 50 MG tablet Take 50 mg by mouth at bedtime.  . Cholecalciferol (VITAMIN D) 2000 UNITS CAPS Take 2,000 Units by mouth daily.  Marland Kitchen gabapentin (NEURONTIN) 300 MG capsule Take 2 capsules (600 mg total) by mouth at bedtime.  Marland Kitchen thyroid (NP THYROID) 120 MG tablet Take 1 tablet (120 mg total) by mouth daily before breakfast.  .  [DISCONTINUED] sulfamethoxazole-trimethoprim (BACTRIM DS,SEPTRA DS) 800-160 MG tablet 2 (two) times daily.      Objective:   Today's Vitals: BP (!) 160/80   Pulse 72   Ht 5' (1.524 m)   Wt 136 lb 3.2 oz (61.8 kg)   BMI 26.60 kg/m  Vitals with BMI 01/21/2019 12/18/2018 07/20/2016  Height 5\' 0"  4\' 11"  5\' 0"   Weight 136 lbs 3 oz 137 lbs 13 oz 134 lbs  BMI 26.6 27.82 26.2  Systolic 160 160 -  Diastolic 80 70 -  Pulse 72 72 -     Physical Exam  She looks systemically well but her blood pressure is still elevated, as it was about a month ago also.  She is alert and orientated without any focal neurological signs.     Assessment   1. Acute cystitis without hematuria   2. Need for immunization against influenza   3. Essential hypertension, benign       Tests ordered Orders Placed This Encounter  Procedures  . Culture, Urine  . Flu Vaccine QUAD High Dose(Fluad)     Plan: 1. I have sent a prescription for ciprofloxacin for the presumed urine infection that she has.  We will send the urine for culture. 2. Influenza vaccination was given today. 3. I am concerned about what I believe in  his newly diagnosed hypertension now and I am going to start her on amlodipine 5 mg daily.  I have explained to her possible side effects. 4. We will review her hypertension on the next visit and further recommendations will depend on results of the urine culture.   Meds ordered this encounter  Medications  . amLODipine (NORVASC) 5 MG tablet    Sig: Take 1 tablet (5 mg total) by mouth daily.    Dispense:  30 tablet    Refill:  3  . ciprofloxacin (CIPRO) 500 MG tablet    Sig: Take 1 tablet (500 mg total) by mouth 2 (two) times daily.    Dispense:  6 tablet    Refill:  1     Luther Parody, MD

## 2019-01-21 NOTE — Addendum Note (Signed)
Addended by: Doree Albee on: 01/21/2019 10:48 AM   Modules accepted: Orders

## 2019-01-23 LAB — URINE CULTURE

## 2019-02-13 DIAGNOSIS — H5203 Hypermetropia, bilateral: Secondary | ICD-10-CM | POA: Diagnosis not present

## 2019-02-24 ENCOUNTER — Other Ambulatory Visit (INDEPENDENT_AMBULATORY_CARE_PROVIDER_SITE_OTHER): Payer: Self-pay

## 2019-02-24 ENCOUNTER — Telehealth (INDEPENDENT_AMBULATORY_CARE_PROVIDER_SITE_OTHER): Payer: Self-pay

## 2019-02-24 DIAGNOSIS — J449 Chronic obstructive pulmonary disease, unspecified: Secondary | ICD-10-CM

## 2019-02-24 NOTE — Progress Notes (Signed)
Referral to [facility].

## 2019-02-24 NOTE — Telephone Encounter (Signed)
Pt was called to give Mammogram appt . Also to notify where referral was sent to for Pulmonary Disease to [facility].

## 2019-03-03 ENCOUNTER — Encounter: Payer: Self-pay | Admitting: Internal Medicine

## 2019-03-03 ENCOUNTER — Ambulatory Visit (INDEPENDENT_AMBULATORY_CARE_PROVIDER_SITE_OTHER): Payer: Medicare HMO

## 2019-03-03 ENCOUNTER — Other Ambulatory Visit: Payer: Self-pay

## 2019-03-03 ENCOUNTER — Ambulatory Visit (INDEPENDENT_AMBULATORY_CARE_PROVIDER_SITE_OTHER): Payer: Medicare HMO | Admitting: Internal Medicine

## 2019-03-03 DIAGNOSIS — R0600 Dyspnea, unspecified: Secondary | ICD-10-CM | POA: Diagnosis not present

## 2019-03-03 DIAGNOSIS — R0609 Other forms of dyspnea: Secondary | ICD-10-CM | POA: Insufficient documentation

## 2019-03-03 DIAGNOSIS — R0602 Shortness of breath: Secondary | ICD-10-CM | POA: Diagnosis not present

## 2019-03-03 DIAGNOSIS — J449 Chronic obstructive pulmonary disease, unspecified: Secondary | ICD-10-CM

## 2019-03-03 DIAGNOSIS — R05 Cough: Secondary | ICD-10-CM | POA: Diagnosis not present

## 2019-03-03 DIAGNOSIS — R058 Other specified cough: Secondary | ICD-10-CM

## 2019-03-03 LAB — CBC WITH DIFFERENTIAL/PLATELET
Basophils Absolute: 0.1 10*3/uL (ref 0.0–0.1)
Basophils Relative: 0.5 % (ref 0.0–3.0)
Eosinophils Absolute: 0.3 10*3/uL (ref 0.0–0.7)
Eosinophils Relative: 2.8 % (ref 0.0–5.0)
HCT: 40.3 % (ref 36.0–46.0)
Hemoglobin: 13.2 g/dL (ref 12.0–15.0)
Lymphocytes Relative: 16.5 % (ref 12.0–46.0)
Lymphs Abs: 1.6 10*3/uL (ref 0.7–4.0)
MCHC: 32.7 g/dL (ref 30.0–36.0)
MCV: 80.9 fl (ref 78.0–100.0)
Monocytes Absolute: 0.5 10*3/uL (ref 0.1–1.0)
Monocytes Relative: 5.1 % (ref 3.0–12.0)
Neutro Abs: 7.4 10*3/uL (ref 1.4–7.7)
Neutrophils Relative %: 75.1 % (ref 43.0–77.0)
Platelets: 300 10*3/uL (ref 150.0–400.0)
RBC: 4.97 Mil/uL (ref 3.87–5.11)
RDW: 14.5 % (ref 11.5–15.5)
WBC: 9.8 10*3/uL (ref 4.0–10.5)

## 2019-03-03 LAB — BASIC METABOLIC PANEL
BUN: 17 mg/dL (ref 6–23)
CO2: 26 mEq/L (ref 19–32)
Calcium: 10.1 mg/dL (ref 8.4–10.5)
Chloride: 105 mEq/L (ref 96–112)
Creatinine, Ser: 0.69 mg/dL (ref 0.40–1.20)
GFR: 83.83 mL/min (ref >60.00)
Glucose, Bld: 84 mg/dL (ref 70–99)
Potassium: 4.3 mEq/L (ref 3.5–5.1)
Sodium: 138 mEq/L (ref 135–145)

## 2019-03-03 LAB — BRAIN NATRIURETIC PEPTIDE: Pro B Natriuretic peptide (BNP): 38 pg/mL (ref 0.0–100.0)

## 2019-03-03 MED ORDER — PANTOPRAZOLE SODIUM 40 MG PO TBEC: 40.0000 mg | DELAYED_RELEASE_TABLET | Freq: Every day | ORAL | 2 refills | Status: DC

## 2019-03-03 MED ORDER — FAMOTIDINE 20 MG PO TABS: ORAL_TABLET | ORAL | 11 refills | Status: DC

## 2019-03-03 NOTE — Progress Notes (Signed)
[name] [name], female    DOB: 06/06/1945,    MRN: [id number]   Brief patient profile:  [id number] [id number]  Quit smoking 2005 grew up [place] with "bad bronchitis breathing and cough" hosp [facility] > much better at discharge but slowed down some and used saba and continued to have episodic flares with either bronchitis or pna several times a year eval by [facility] but not maint rx referred to pulmonary clinic 03/03/2019 by Dr  Karilyn [facility]rani [facility] since with daily cough/doe     History of Present Illness  03/03/2019  Pulmonary/ 1st office eval/Adonis Ryther  Chief Complaint  Patient presents with   Pulmonary Consult    Referred by Dr Karilyn CotaGosrani for eval of COPD. Pt c/o SOB and cough for the past year.   Dyspnea: MMRC3 = can't walk 100 yards even at a slow pace at a flat grade s stopping due to sob   Cough: seems to choke her to point of gag but mostly dry day >> noct Sleep: sleeping on back /one pillow less  SABA use: none x one month  - has neb but doesn't remember if it helped   Past Medical History:  Diagnosis Date   COPD (chronic obstructive pulmonary disease) (HCC)    Depression    Essential hypertension, benign 01/21/2019   History of pneumonia    2010 on ventalator   History of recurrent UTIs    Hypothyroidism    Insomnia 12/18/2018   Left knee DJD    PTSD (post-traumatic stress disorder)    Restless leg syndrome     Outpatient Medications Prior to Visit  Medication Sig Dispense Refill   acetaminophen (TYLENOL) 325 MG tablet Take 2 tablets (650 mg total) by mouth every 6 (six) hours as needed for mild pain (or Fever >/= 101).     ALPRAZolam (XANAX) 0.5 MG tablet Take 1 tablet (0.5 mg total) by mouth at bedtime as needed for anxiety. 30 tablet 3   amitriptyline (ELAVIL) 50 MG tablet Take 50 mg by mouth at bedtime.     amLODipine (NORVASC) 5 MG tablet Take 1 tablet (5 mg total) by mouth daily. 30 tablet 3   gabapentin (NEURONTIN) 300 MG  capsule Take 2 capsules (600 mg total) by mouth at bedtime. 180 capsule 0   thyroid (NP THYROID) 120 MG tablet Take 1 tablet (120 mg total) by mouth daily before breakfast. 90 tablet 0   albuterol (PROVENTIL HFA;VENTOLIN HFA) 108 (90 BASE) MCG/ACT inhaler Inhale 1 puff into the lungs every 6 (six) hours as needed for wheezing or shortness of breath.     Cholecalciferol (VITAMIN D) 2000 UNITS CAPS Take 2,000 Units by mouth daily.     ciprofloxacin (CIPRO) 500 MG tablet Take 1 tablet (500 mg total) by mouth 2 (two) times daily. 6 tablet 1      Objective:     BP 118/60 (BP Location: Left Arm, Cuff Size: Normal)    Pulse 80    Temp (!) 97 F (36.1 C) (Temporal)    Ht 4\' 11"  (1.499 m)    Wt 135 lb (61.2 kg)    SpO2 98% Comment: on RA   BMI 27.27 kg/m   SpO2: 98 %(on RA)   amb pleasant wf mod hoarse with upper airway pattern dry coughing   HEENT : pt wearing mask not removed for exam due to covid -19 concerns.    NECK :  without JVD/Nodes/TM/ nl  carotid upstrokes bilaterally   LUNGS: no acc muscle use,  Nl contour chest with minimal transmitted upper airway quality sounds  without cough on insp or exp maneuvers   CV:  RRR  no s3 or murmur or increase in P2, and no edema   ABD:  soft and nontender with nl inspiratory excursion in the supine position. No bruits or organomegaly appreciated, bowel sounds nl  MS:  Nl gait/ ext warm without deformities, calf tenderness, cyanosis or clubbing No obvious joint restrictions   SKIN: warm and dry without lesions    NEURO:  alert, approp, nl sensorium with  no motor or cerebellar deficits apparent.   CXR PA and Lateral:   03/03/2019 :    I personally reviewed images and agree with radiology impression as follows:    No acute abnormality of the lungs.   Labs ordered/ reviewed:      Chemistry      Component Value Date/Time   NA 138 03/03/2019 1225   K 4.3 03/03/2019 1225   CL 105 03/03/2019 1225   CO2 26 03/03/2019 1225   BUN 17  03/03/2019 1225   CREATININE 0.69 03/03/2019 1225   CREATININE 0.83 12/18/2018 0951      Component Value Date/Time   CALCIUM 10.1 03/03/2019 1225   ALKPHOS      AST 27 12/18/2018 0951   ALT 26 12/18/2018 0951   BILITOT 0.3 12/18/2018 0951        Lab Results  Component Value Date   WBC 9.8 03/03/2019   HGB 13.2 03/03/2019   HCT 40.3 03/03/2019   MCV 80.9 03/03/2019   PLT 300.0 03/03/2019        EOS                                                              0.3                                    03/03/2019   Lab Results  Component Value Date   DDIMER 0.64 (H) 03/03/2019         Lab Results  Component Value Date   PROBNP 38.0 03/03/2019      No results found for: ESRSEDRATE   Labs ordered 03/03/2019  :  allergy profile   alpha one AT phenotype         Assessment   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  Quit smoking 2005  - onset of symptoms 2018 p [facility] - 03/03/2019   Walked RA x two laps =  approx 52ft @ mod to fast pace - stopped due to end of study, cc weak but min sob with sats of 97 at the end of the study.   When respiratory symptoms begin or become refractory well after a patient reports complete smoking cessation,  Especially when this wasn't the case while they were smoking, a red flag is raised based on the work of Dr Primitivo Gauze which states:  if you quit smoking when your best day FEV1 is still well preserved it is highly unlikely you will progress to severe disease.  That is to say, once the smoking stops,  the symptoms should not  suddenly erupt or markedly worsen.  If so, the differential diagnosis should include  obesity/deconditioning,  LPR/Reflux/Aspiration syndromes,  occult CHF, or  especially side effect of medications commonly used in this population, none of the usual suspects are listed here, however.     >>>> Consider doing pfts p address uacs (see sep a/p)  >>>>rec max rx for gerd x 4 week then regroup  with all meds in hand using a trust  but verify approach to confirm accurate Medication  Reconciliation The principal here is that until we are certain that the  patients are doing what we've asked, it makes no sense to ask them to do more.       DOE (dyspnea on exertion) Symptoms are markedly disproportionate to objective findings and not clear to what extent this is actually a pulmonary  problem but pt does appear to have difficult to sort out respiratory symptoms of unknown origin for which  DDX  = almost all start with A and  include Adherence, Ace Inhibitors, Acid Reflux, Active Sinus Disease, Alpha 1 Antitripsin deficiency, Anxiety masquerading as Airways dz,  ABPA,  Allergy(esp in young), Aspiration (esp in elderly), Adverse effects of meds,  Active smoking or Vaping, A bunch of PE's/clot burden (a few small clots can't cause this syndrome unless there is already severe underlying pulm or vascular dz with poor reserve),  Anemia or thyroid disorder, plus two Bs  = Bronchiectasis and Beta blocker use..and one C= CHF     Adherence is always the initial "prime suspect" and is a multilayered concern that requires a "trust but verify" approach in every patient - starting with knowing how to use medications, especially inhalers, correctly, keeping up with refills and understanding the fundamental difference between maintenance and prns vs those medications only taken for a very short course and then stopped and not refilled.  - return with all meds  ? Acid (or non-acid) GERD > always difficult to exclude as up to 75% of pts in some series report no assoc GI/ Heartburn symptoms> rec max (24h)  acid suppression and diet restrictions/ reviewed and instructions given in writing.   ? Allergy/ asthma > unlikely s more robust resp to saba, more noct symptoms than she revealed  ? Alpha one AT def > seen phenotype to be complete  ? Anxiety/depression/ deconditioning > dx of exclusion  ? A bunch of PE's > D dimer nl - while high normal value  (seen commonly in the elderly or chronically ill)  may miss small peripheral pe, the clot burden with sob is moderately high and the d dimer  has a very high neg pred value if used in this setting.    ? Anemia > ruled out  ? CHF > nl bnp excludes for all intents and purposes here.     Upper airway cough syndrome Onset 2018 p CAP  - Allergy profile 03/03/2019 >  Eos 0.3 /  IgE pending  - 03/03/2019 max rx for gerd >>>  Upper airway cough syndrome (previously labeled PNDS),  is so named because it's frequently impossible to sort out how much is  CR/sinusitis with freq throat clearing (which can be related to primary GERD)   vs  causing  secondary (" extra esophageal")  GERD from wide swings in gastric pressure that occur with throat clearing, often  promoting self use of mint and menthol lozenges that reduce the lower esophageal sphincter tone and exacerbate the problem further in a cyclical fashion.  These are the same pts (now being labeled as having "irritable larynx syndrome" by some cough centers) who not infrequently have a history of having failed to tolerate ace inhibitors,  dry powder inhalers or biphosphonates or report having atypical/extraesophageal reflux symptoms that don't respond to standard doses of PPI  and are easily confused as having aecopd or asthma flares by even experienced allergists/ pulmonologists (myself included).   >>> rec start with max gerd rx then return in 4 weeks   Advised: The standardized cough guidelines published in Chest by Lissa Morales in 2006 are still the best available and consist of a multiple step process (up to 12!) , not a single office visit,  and are intended  to address this problem logically,  with an alogrithm dependent on response to empiric treatment at  each progressive step  to determine a specific diagnosis with  minimal addtional testing needed. Therefore if adherence is an issue or can't be accurately verified,  it's very unlikely the  standard evaluation and treatment will be successful here.    Furthermore, response to therapy (other than acute cough suppression, which should only be used short term with avoidance of narcotic containing cough syrups if possible), can be a gradual process for which the patient is not likely to  perceive immediate benefit.  Unlike going to an eye doctor where the best perscription is almost always the first one and is immediately effective, this is almost never the case in the management of chronic cough syndromes. Therefore the patient needs to commit up front to consistently adhere to recommendations  for up to 6 weeks of therapy directed at the likely underlying problem(s) before the response can be reasonably evaluated.      Total time devoted to counseling  > 50 % of initial 60 min office visit:  reviewed case with pt/ daughter/  directly observed portions of ambulatory 02 saturation study/ discussion of options/alternatives/ personally creating written customized instructions  in presence of pt  then going over those specific  Instructions directly with the pt including how to use all of the meds but in particular covering each new medication in detail and the difference between the maintenance= "automatic" meds and the prns using an action plan format for the latter (If this problem/symptom => do that organization reading Left to right).  Please see AVS from this visit for a full list of these instructions which I personally wrote for this pt and  are unique to this visit.      Christinia Gully, MD 03/03/2019

## 2019-03-03 NOTE — Patient Instructions (Signed)
Pantoprazole (protonix) 40 mg   Take  30-60 min before first meal of the day and Pepcid (famotidine)  20 mg one after  Supper until return to office - this is the best way to tell whether stomach acid is contributing to your problem.    GERD (REFLUX)  is an extremely common cause of respiratory symptoms just like yours , many times with no obvious heartburn at all.    It can be treated with medication, but also with lifestyle changes including elevation of the head of your bed (ideally with 6 -8inch blocks under the headboard of your bed),  Smoking cessation, avoidance of late meals, excessive alcohol, and avoid fatty foods, chocolate, peppermint, colas, red wine, and acidic juices such as orange juice.  NO MINT OR MENTHOL PRODUCTS SO NO COUGH DROPS  USE SUGARLESS CANDY INSTEAD (Jolley ranchers or Stover's or Life Savers) or even ice chips will also do - the key is to swallow to prevent all throat clearing. NO OIL BASED VITAMINS - use powdered substitutes.  Avoid fish oil when coughing.    Please remember to go to the lab and x-ray department   for your tests - we will call you with the results when they are available.     Please schedule a follow up office visit in 4 weeks, sooner if needed  with all medications /inhalers/ solutions in hand so we can verify exactly what you are taking. This includes all medications from all doctors and over the counters

## 2019-03-04 ENCOUNTER — Encounter: Payer: Self-pay | Admitting: Internal Medicine

## 2019-03-04 ENCOUNTER — Other Ambulatory Visit (INDEPENDENT_AMBULATORY_CARE_PROVIDER_SITE_OTHER): Payer: Self-pay | Admitting: Internal Medicine

## 2019-03-04 DIAGNOSIS — R05 Cough: Secondary | ICD-10-CM | POA: Insufficient documentation

## 2019-03-04 DIAGNOSIS — R058 Other specified cough: Secondary | ICD-10-CM | POA: Insufficient documentation

## 2019-03-04 NOTE — Assessment & Plan Note (Addendum)
Quit smoking 2005  - onset of symptoms 2018 p admit to [facility] with CAP - 03/03/2019   Walked RA x two laps =  approx 538ft @ mod to fast pace - stopped due to end of study, cc weak but min sob with sats of 97 at the end of the study.   When respiratory symptoms begin or become refractory well after a patient reports complete smoking cessation,  Especially when this wasn't the case while they were smoking, a red flag is raised based on the work of Dr Kris Mouton which states:  if you quit smoking when your best day FEV1 is still well preserved it is highly unlikely you will progress to severe disease.  That is to say, once the smoking stops,  the symptoms should not suddenly erupt or markedly worsen.  If so, the differential diagnosis should include  obesity/deconditioning,  LPR/Reflux/Aspiration syndromes,  occult CHF, or  especially side effect of medications commonly used in this population, none of the usual suspects are listed here, however.     >>>> Consider doing pfts p address uacs (see sep a/p)  >>>>rec max rx for gerd x 4 week then regroup  with all meds in hand using a trust but verify approach to confirm accurate Medication  Reconciliation The principal here is that until we are certain that the  patients are doing what we've asked, it makes no sense to ask them to do more.

## 2019-03-04 NOTE — Assessment & Plan Note (Signed)
Onset 2018 p CAP  - Allergy profile 03/03/2019 >  Eos 0.3 /  IgE pending  - 03/03/2019 max rx for gerd >>>   Upper airway cough syndrome (previously labeled PNDS),  is so named because it's frequently impossible to sort out how much is  CR/sinusitis with freq throat clearing (which can be related to primary GERD)   vs  causing  secondary (" extra esophageal")  GERD from wide swings in gastric pressure that occur with throat clearing, often  promoting self use of mint and menthol lozenges that reduce the lower esophageal sphincter tone and exacerbate the problem further in a cyclical fashion.   These are the same pts (now being labeled as having "irritable larynx syndrome" by some cough centers) who not infrequently have a history of having failed to tolerate ace inhibitors,  dry powder inhalers or biphosphonates or report having atypical/extraesophageal reflux symptoms that don't respond to standard doses of PPI  and are easily confused as having aecopd or asthma flares by even experienced allergists/ pulmonologists (myself included).   >>> rec start with max gerd rx then return in 4 weeks   Advised: The standardized cough guidelines published in Chest by Lissa Morales in 2006 are still the best available and consist of a multiple step process (up to 12!) , not a single office visit,  and are intended  to address this problem logically,  with an alogrithm dependent on response to empiric treatment at  each progressive step  to determine a specific diagnosis with  minimal addtional testing needed. Therefore if adherence is an issue or can't be accurately verified,  it's very unlikely the standard evaluation and treatment will be successful here.    Furthermore, response to therapy (other than acute cough suppression, which should only be used short term with avoidance of narcotic containing cough syrups if possible), can be a gradual process for which the patient is not likely to  perceive immediate  benefit.  Unlike going to an eye doctor where the best perscription is almost always the first one and is immediately effective, this is almost never the case in the management of chronic cough syndromes. Therefore the patient needs to commit up front to consistently adhere to recommendations  for up to 6 weeks of therapy directed at the likely underlying problem(s) before the response can be reasonably evaluated.     Total time devoted to counseling  > 50 % of initial 60 min office visit:  reviewed case with pt/ daughter/  directly observed portions of ambulatory 02 saturation study/ discussion of options/alternatives/ personally creating written customized instructions  in presence of pt  then going over those specific  Instructions directly with the pt including how to use all of the meds but in particular covering each new medication in detail and the difference between the maintenance= "automatic" meds and the prns using an action plan format for the latter (If this problem/symptom => do that organization reading Left to right).  Please see AVS from this visit for a full list of these instructions which I personally wrote for this pt and  are unique to this visit.

## 2019-03-04 NOTE — Progress Notes (Signed)
Spoke with pt and notified of results per Dr. Wert. Pt verbalized understanding and denied any questions. 

## 2019-03-04 NOTE — Assessment & Plan Note (Addendum)
Symptoms are markedly disproportionate to objective findings and not clear to what extent this is actually a pulmonary  problem but pt does appear to have difficult to sort out respiratory symptoms of unknown origin for which  DDX  = almost all start with A and  include Adherence, Ace Inhibitors, Acid Reflux, Active Sinus Disease, Alpha 1 Antitripsin deficiency, Anxiety masquerading as Airways dz,  ABPA,  Allergy(esp in young), Aspiration (esp in elderly), Adverse effects of meds,  Active smoking or Vaping, A bunch of PE's/clot burden (a few small clots can't cause this syndrome unless there is already severe underlying pulm or vascular dz with poor reserve),  Anemia or thyroid disorder, plus two Bs  = Bronchiectasis and Beta blocker use..and one C= CHF     Adherence is always the initial "prime suspect" and is a multilayered concern that requires a "trust but verify" approach in every patient - starting with knowing how to use medications, especially inhalers, correctly, keeping up with refills and understanding the fundamental difference between maintenance and prns vs those medications only taken for a very short course and then stopped and not refilled.  - return with all meds  ? Acid (or non-acid) GERD > always difficult to exclude as up to 75% of pts in some series report no assoc GI/ Heartburn symptoms> rec max (24h)  acid suppression and diet restrictions/ reviewed and instructions given in writing.   ? Allergy/ asthma > unlikely s more robust resp to saba, more noct symptoms than she revealed  ? Alpha one AT def > seen phenotype to be complete  ? Anxiety/depression/ deconditioning > dx of exclusion  ? A bunch of PE's > D dimer nl - while high normal value (seen commonly in the elderly or chronically ill)  may miss small peripheral pe, the clot burden with sob is moderately high and the d dimer  has a very high neg pred value if used in this setting.    ? Anemia > ruled out  ? CHF > nl bnp  excludes for all intents and purposes here.

## 2019-03-12 LAB — EXTRA SPECIMEN

## 2019-03-12 LAB — ALPHA-1 ANTITRYPSIN PHENOTYPE: A-1 Antitrypsin, Ser: 170 mg/dL (ref 83–199)

## 2019-03-12 LAB — IGE: IgE (Immunoglobulin E), Serum: 44 kU/L (ref <=114)

## 2019-03-12 LAB — D-DIMER, QUANTITATIVE: D-Dimer, Quant: 0.64 mcg/mL FEU — ABNORMAL HIGH (ref <0.50)

## 2019-03-13 NOTE — Progress Notes (Signed)
MY CHART NOTE SENT

## 2019-03-16 ENCOUNTER — Ambulatory Visit (HOSPITAL_COMMUNITY): Payer: Medicare HMO

## 2019-03-24 ENCOUNTER — Encounter (INDEPENDENT_AMBULATORY_CARE_PROVIDER_SITE_OTHER): Payer: Self-pay | Admitting: Internal Medicine

## 2019-03-24 ENCOUNTER — Ambulatory Visit (INDEPENDENT_AMBULATORY_CARE_PROVIDER_SITE_OTHER): Payer: Medicare HMO | Admitting: Internal Medicine

## 2019-03-24 ENCOUNTER — Other Ambulatory Visit: Payer: Self-pay

## 2019-03-24 VITALS — BP 130/60 | HR 72 | Ht 59.0 in | Wt 136.8 lb

## 2019-03-24 DIAGNOSIS — K219 Gastro-esophageal reflux disease without esophagitis: Secondary | ICD-10-CM | POA: Diagnosis not present

## 2019-03-24 DIAGNOSIS — E039 Hypothyroidism, unspecified: Secondary | ICD-10-CM

## 2019-03-24 DIAGNOSIS — I10 Essential (primary) hypertension: Secondary | ICD-10-CM | POA: Diagnosis not present

## 2019-03-24 NOTE — Progress Notes (Signed)
 : Intervention Frequency ACO  Documented Smoking Status Yearly  Screened one or more times in 24 months  Cessation Counseling or  Active cessation medication Past 24 months  Past 24 months   Guideline developer: UpToDate (See UpToDate for funding source) Date Released: 2014       Wellness Office Visit  Subjective:  Patient ID: [name] [name], female    DOB: 09-05-1945  Age: 73 y.o. MRN: [id number]  CC: This lady comes in for follow-up of hypertension, hypothyroidism, vitamin D deficiency. HPI She is doing reasonably well.  She did go and see pulmonology for her dyspnea.  The pulmonologist does not think this is related to her lungs but may be related to acid reflux disease and he has started on PPI and will follow her up in the next few weeks. She continues on desiccated thyroid without any problems and she is doing well. She also has tolerated amlodipine for hypertension and this seems to be controlling her blood pressure.  She denies any chest pain, dyspnea, palpitations or limb weakness.  Past Medical History:  Diagnosis Date  . COPD (chronic obstructive pulmonary disease) ([facility])   . Depression   . Essential hypertension, benign 01/21/2019  . History of pneumonia    2010 on ventalator  . History of recurrent UTIs   . Hypothyroidism   . Insomnia 12/18/2018  . Left knee DJD   . PTSD (post-traumatic stress disorder)   . Restless leg syndrome       Family History  Problem Relation Age of Onset  . Stroke Mother   . Heart disease Father   . Heart attack Father   . Hypertension Father     Social History   Social History Narrative   Married 22 years in 2nd marriage.First marriage lasted 20 years.Retired,now a Education officer, environmental.   Social History   Tobacco Use  . Smoking status: Former Smoker    Packs/day: 1.50    Years: 35.00    Pack years: 52.50    Types: Cigarettes    Quit date: 04/10/2003    Years since quitting: 15.9  . Smokeless tobacco: Never Used  Substance Use  Topics  . Alcohol use: No    Current Meds  Medication Sig  . acetaminophen (TYLENOL) 325 MG tablet Take 2 tablets (650 mg total) by mouth every 6 (six) hours as needed for mild pain (or Fever >/= 101).  Marland Kitchen ALPRAZolam (XANAX) 0.5 MG tablet Take 1 tablet (0.5 mg total) by mouth at bedtime as needed for anxiety.  Marland Kitchen amitriptyline (ELAVIL) 50 MG tablet TAKE 1 TABLET EVERY DAY  . amLODipine (NORVASC) 5 MG tablet Take 1 tablet (5 mg total) by mouth daily.  . famotidine (PEPCID) 20 MG tablet One after supper  . gabapentin (NEURONTIN) 300 MG capsule Take 2 capsules (600 mg total) by mouth at bedtime.  . pantoprazole (PROTONIX) 40 MG tablet Take 1 tablet (40 mg total) by mouth daily. Take 30-60 min before first meal of the day  . thyroid (NP THYROID) 120 MG tablet Take 1 tablet (120 mg total) by mouth daily before breakfast.       Objective:   Today's Vitals: BP 130/60   Pulse 72   Ht 4\' 11"  (1.499 m)   Wt 136 lb 12.8 oz (62.1 kg)   BMI 27.63 kg/m  Vitals with BMI 03/24/2019 03/03/2019 01/21/2019  Height 4\' 11"  4\' 11"  5\' 0"   Weight 136 lbs 13 oz 135 lbs 136 lbs 3 oz  BMI 27.62 27.25  50.3  Systolic 546 568 127  Diastolic 60 60 80  Pulse 72 80 72     Physical Exam   She looks systemically well.  Weight is stable.  Blood pressure is well controlled.  She is alert and orientated without any focal neurological signs.    Assessment   1. Acquired hypothyroidism   2. Essential hypertension, benign   3. Gastroesophageal reflux disease without esophagitis       Tests ordered No orders of the defined types were placed in this encounter.    Plan: 1. She will continue with desiccated thyroid for hypothyroidism. 2. She will continue with antihypertensive therapy and this seems to be controlling her blood pressure. 3. She will continue with PPI for gastroesophageal reflux symptoms. 4. Judson Roch will see her in about 4 months time for an annual physical exam.   No orders of the  defined types were placed in this encounter.   Doree Albee, MD

## 2019-04-07 ENCOUNTER — Other Ambulatory Visit (INDEPENDENT_AMBULATORY_CARE_PROVIDER_SITE_OTHER): Payer: Self-pay | Admitting: Internal Medicine

## 2019-06-03 ENCOUNTER — Other Ambulatory Visit (INDEPENDENT_AMBULATORY_CARE_PROVIDER_SITE_OTHER): Payer: Self-pay | Admitting: Internal Medicine

## 2019-06-16 DIAGNOSIS — S83282D Other tear of lateral meniscus, current injury, left knee, subsequent encounter: Secondary | ICD-10-CM | POA: Diagnosis not present

## 2019-06-17 ENCOUNTER — Encounter (INDEPENDENT_AMBULATORY_CARE_PROVIDER_SITE_OTHER): Payer: Self-pay | Admitting: Internal Medicine

## 2019-06-17 ENCOUNTER — Other Ambulatory Visit (INDEPENDENT_AMBULATORY_CARE_PROVIDER_SITE_OTHER): Payer: Self-pay | Admitting: Internal Medicine

## 2019-06-17 ENCOUNTER — Other Ambulatory Visit: Payer: Self-pay

## 2019-06-17 ENCOUNTER — Encounter (INDEPENDENT_AMBULATORY_CARE_PROVIDER_SITE_OTHER): Payer: Self-pay

## 2019-06-17 ENCOUNTER — Ambulatory Visit (INDEPENDENT_AMBULATORY_CARE_PROVIDER_SITE_OTHER): Payer: Medicare HMO | Admitting: Internal Medicine

## 2019-06-17 VITALS — BP 138/65 | HR 92 | Temp 97.8°F | Ht 60.0 in | Wt 136.2 lb

## 2019-06-17 DIAGNOSIS — N3000 Acute cystitis without hematuria: Secondary | ICD-10-CM | POA: Diagnosis not present

## 2019-06-17 MED ORDER — GABAPENTIN 300 MG PO CAPS: 600.0000 mg | ORAL_CAPSULE | Freq: Every day | ORAL | 0 refills | Status: DC

## 2019-06-17 MED ORDER — CIPROFLOXACIN HCL 500 MG PO TABS: 500.0000 mg | ORAL_TABLET | Freq: Two times a day (BID) | ORAL | 0 refills | Status: DC

## 2019-06-17 NOTE — Progress Notes (Signed)
Metrics: Intervention Frequency ACO  Documented Smoking Status Yearly  Screened one or more times in 24 months  Cessation Counseling or  Active cessation medication Past 24 months  Past 24 months   Guideline developer: UpToDate (See UpToDate for funding source) Date Released: 2014       Wellness Office Visit  Subjective:  Patient ID: [name] [name], female    DOB: 1946-02-15  Age: 74 y.o. MRN: [id number]  CC: Dysuria, increased frequency of micturition HPI  This lady comes in with approximately 5-day history of symptoms of above.  She denies nausea, vomiting or abdominal pain.  She has had UTIs before. Past Medical History:  Diagnosis Date  . COPD (chronic obstructive pulmonary disease) ([place])   . Depression   . Essential hypertension, benign 01/21/2019  . History of pneumonia    2010 on ventalator  . History of recurrent UTIs   . Hypothyroidism   . Insomnia 12/18/2018  . Left knee DJD   . PTSD (post-traumatic stress disorder)   . Restless leg syndrome       Family History  Problem Relation Age of Onset  . Stroke Mother   . Heart disease Father   . Heart attack Father   . Hypertension Father     Social History   Social History Narrative   Married 22 years in 2nd marriage.First marriage lasted 60 years.Retired,now a Theme park manager.   Social History   Tobacco Use  . Smoking status: Former Smoker    Packs/day: 1.50    Years: 35.00    Pack years: 52.50    Types: Cigarettes    Quit date: 04/10/2003    Years since quitting: 16.1  . Smokeless tobacco: Never Used  Substance Use Topics  . Alcohol use: No    Current Meds  Medication Sig  . acetaminophen (TYLENOL) 325 MG tablet Take 2 tablets (650 mg total) by mouth every 6 (six) hours as needed for mild pain (or Fever >/= 101).  Marland Kitchen ALPRAZolam (XANAX) 0.5 MG tablet Take 1 tablet (0.5 mg total) by mouth at bedtime as needed for anxiety.  Marland Kitchen amLODipine (NORVASC) 5 MG tablet Take 1 tablet by mouth once daily  . famotidine  (PEPCID) 20 MG tablet One after supper  . gabapentin (NEURONTIN) 300 MG capsule Take 2 capsules (600 mg total) by mouth at bedtime.  . NP THYROID 120 MG tablet TAKE 1 TABLET BY MOUTH ONCE DAILY BEFORE BREAKFAST  . [DISCONTINUED] gabapentin (NEURONTIN) 300 MG capsule TAKE 2 CAPSULES AT BEDTIME.  . [DISCONTINUED] pantoprazole (PROTONIX) 40 MG tablet Take 1 tablet (40 mg total) by mouth daily. Take 30-60 min before first meal of the day     Objective:   Today's Vitals: BP 138/65 (BP Location: Left Arm, Patient Position: Sitting, Cuff Size: Normal)   Pulse 92   Temp 97.8 F (36.6 C) (Temporal)   Ht 5' (1.524 m)   Wt 136 lb 3.2 oz (61.8 kg)   SpO2 99%   BMI 26.60 kg/m  Vitals with BMI 06/17/2019 03/24/2019 03/03/2019  Height 5\' 0"  4\' 11"  4\' 11"   Weight 136 lbs 3 oz 136 lbs 13 oz 135 lbs  BMI 26.6 82.99 37.16  Systolic 967 893 810  Diastolic 65 60 60  Pulse 92 72 80     Physical Exam   She looks systemically well.  She is not in acute pain.  No new physical findings.    Assessment   1. Acute cystitis without hematuria  Tests ordered Orders Placed This Encounter  Procedures  . Culture, Urine     Plan: 1. She likely has a UTI.  Urine will be taken for culture.  I have sent her ciprofloxacin as empirical treatment for the time being and I will let her know further depending on sensitivities and identification.   Meds ordered this encounter  Medications  . gabapentin (NEURONTIN) 300 MG capsule    Sig: Take 2 capsules (600 mg total) by mouth at bedtime.    Dispense:  20 capsule    Refill:  0  . ciprofloxacin (CIPRO) 500 MG tablet    Sig: Take 1 tablet (500 mg total) by mouth 2 (two) times daily.    Dispense:  20 tablet    Refill:  0    Demyah Smyre Normajean Glasgow, MD

## 2019-06-18 LAB — URINE CULTURE

## 2019-06-24 NOTE — Progress Notes (Signed)
Patient called.  Was given instructions on the lab results for negative growth for urine culture. Pt was told to finish the antibiotic and if she has problem again, contact the office.

## 2019-06-25 ENCOUNTER — Other Ambulatory Visit: Payer: Self-pay

## 2019-06-25 ENCOUNTER — Ambulatory Visit (HOSPITAL_COMMUNITY)
Admission: RE | Admit: 2019-06-25 | Discharge: 2019-06-25 | Disposition: A | Discharge status: Home / Self Care | Payer: Medicare HMO | Source: Ambulatory Visit | Attending: Internal Medicine | Admitting: Internal Medicine

## 2019-06-25 DIAGNOSIS — Z1231 Encounter for screening mammogram for malignant neoplasm of breast: Secondary | ICD-10-CM | POA: Insufficient documentation

## 2019-06-25 DIAGNOSIS — Z1239 Encounter for other screening for malignant neoplasm of breast: Secondary | ICD-10-CM

## 2019-06-30 ENCOUNTER — Other Ambulatory Visit (HOSPITAL_COMMUNITY): Payer: Self-pay | Admitting: Internal Medicine

## 2019-06-30 DIAGNOSIS — R928 Other abnormal and inconclusive findings on diagnostic imaging of breast: Secondary | ICD-10-CM

## 2019-07-01 ENCOUNTER — Telehealth (INDEPENDENT_AMBULATORY_CARE_PROVIDER_SITE_OTHER): Payer: Self-pay

## 2019-07-01 DIAGNOSIS — I10 Essential (primary) hypertension: Secondary | ICD-10-CM

## 2019-07-01 MED ORDER — AMLODIPINE BESYLATE 5 MG PO TABS: 5.0000 mg | ORAL_TABLET | Freq: Every day | ORAL | 0 refills | Status: DC

## 2019-07-01 NOTE — Telephone Encounter (Signed)
[name] [name], CMA  

## 2019-07-06 ENCOUNTER — Telehealth (INDEPENDENT_AMBULATORY_CARE_PROVIDER_SITE_OTHER): Payer: Self-pay

## 2019-07-06 DIAGNOSIS — I10 Essential (primary) hypertension: Secondary | ICD-10-CM

## 2019-07-06 MED ORDER — AMLODIPINE BESYLATE 5 MG PO TABS: 5.0000 mg | ORAL_TABLET | Freq: Every day | ORAL | 0 refills | Status: DC

## 2019-07-06 NOTE — Telephone Encounter (Signed)
LeighAnn Sema Stangler, CMA  

## 2019-07-07 ENCOUNTER — Encounter (HOSPITAL_COMMUNITY): Payer: Self-pay

## 2019-07-07 ENCOUNTER — Ambulatory Visit (HOSPITAL_COMMUNITY): Payer: Medicare HMO

## 2019-07-07 ENCOUNTER — Encounter (HOSPITAL_COMMUNITY): Payer: Medicare HMO

## 2019-07-09 ENCOUNTER — Other Ambulatory Visit (INDEPENDENT_AMBULATORY_CARE_PROVIDER_SITE_OTHER): Payer: Self-pay | Admitting: Internal Medicine

## 2019-07-09 DIAGNOSIS — I10 Essential (primary) hypertension: Secondary | ICD-10-CM

## 2019-07-09 MED ORDER — AMLODIPINE BESYLATE 5 MG PO TABS: 5.0000 mg | ORAL_TABLET | Freq: Every day | ORAL | 0 refills | Status: DC

## 2019-07-22 ENCOUNTER — Encounter (INDEPENDENT_AMBULATORY_CARE_PROVIDER_SITE_OTHER): Payer: Self-pay | Admitting: Nurse Practitioner

## 2019-07-22 ENCOUNTER — Other Ambulatory Visit: Payer: Self-pay

## 2019-07-22 ENCOUNTER — Ambulatory Visit (INDEPENDENT_AMBULATORY_CARE_PROVIDER_SITE_OTHER): Payer: Medicare HMO | Admitting: Nurse Practitioner

## 2019-07-22 VITALS — BP 105/60 | HR 63 | Temp 97.7°F | Ht 60.0 in | Wt 134.0 lb

## 2019-07-22 DIAGNOSIS — Z23 Encounter for immunization: Secondary | ICD-10-CM | POA: Diagnosis not present

## 2019-07-22 DIAGNOSIS — E039 Hypothyroidism, unspecified: Secondary | ICD-10-CM

## 2019-07-22 DIAGNOSIS — Z13820 Encounter for screening for osteoporosis: Secondary | ICD-10-CM

## 2019-07-22 DIAGNOSIS — M810 Age-related osteoporosis without current pathological fracture: Secondary | ICD-10-CM | POA: Insufficient documentation

## 2019-07-22 DIAGNOSIS — Z131 Encounter for screening for diabetes mellitus: Secondary | ICD-10-CM | POA: Diagnosis not present

## 2019-07-22 DIAGNOSIS — Z139 Encounter for screening, unspecified: Secondary | ICD-10-CM

## 2019-07-22 DIAGNOSIS — E559 Vitamin D deficiency, unspecified: Secondary | ICD-10-CM | POA: Diagnosis not present

## 2019-07-22 DIAGNOSIS — Z13220 Encounter for screening for lipoid disorders: Secondary | ICD-10-CM

## 2019-07-22 DIAGNOSIS — Z1159 Encounter for screening for other viral diseases: Secondary | ICD-10-CM

## 2019-07-22 DIAGNOSIS — Z1211 Encounter for screening for malignant neoplasm of colon: Secondary | ICD-10-CM

## 2019-07-22 DIAGNOSIS — I10 Essential (primary) hypertension: Secondary | ICD-10-CM

## 2019-07-22 DIAGNOSIS — Z0001 Encounter for general adult medical examination with abnormal findings: Secondary | ICD-10-CM | POA: Diagnosis not present

## 2019-07-22 DIAGNOSIS — M8580 Other specified disorders of bone density and structure, unspecified site: Secondary | ICD-10-CM

## 2019-07-22 MED ORDER — AMLODIPINE BESYLATE 2.5 MG PO TABS: 2.5000 mg | ORAL_TABLET | Freq: Every day | ORAL | 1 refills | Status: DC

## 2019-07-22 NOTE — Progress Notes (Signed)
Subjective:  Patient ID: [name] [name], female    DOB: [birth year]  Age: 74 y.o. MRN: [id number]  CC:  Chief Complaint  Patient presents with  . Annual Exam      HPI  This patient arrives today for her annual physical exam.  Only complaint today is that she feels a little bit off balance at times.  She has not noticed any pattern with this.  The feeling will often resolve spontaneously.  Denies any chest pain or difficulty breathing when this feeling occurs.  She also mentions to me that she is scheduled to have diagnostic mammogram and ultrasound of her right breast in approximately 1 week.  She denies feeling any mass or lump, but was told she needed diagnostic mammogram after undergoing screening mammogram which showed some possible abnormalities.  As far as immunizations go she is most likely due for a tetanus booster, she has already completed her COVID-19 vaccine series and it has been at least 2 weeks since this was completed, she is up-to-date with pneumonia and flu, and believes she got her shingles vaccine in the past as well.  As for screening recommendations she would be due for colon cancer screening, she would prefer to try FIT screening as opposed to doing screening colonoscopy.  She has quit smoking tobacco approximately 16 years ago.  She is due for hepatitis C screening and is willing to undergo this.  She is also due for bone density screening and tells me that she did undergo DEXA scan over a decade ago and she was told that she had osteoporosis at that time.  Per chart review I do see that she was osteopenic, but I do not see evidence for osteoporosis.  She is not on any medication for this currently.  She does mention that she is wondering if she needs to be screened for abdominal aortic aneurysm as her husband was screened for this in the past and is currently being treated for this.  She denies any family history of AAA.   Past Medical History:  Diagnosis Date  . COPD  (chronic obstructive pulmonary disease) ([place])   . Depression   . Essential hypertension, benign 01/21/2019  . History of pneumonia    2010 on ventalator  . History of recurrent UTIs   . Hypothyroidism   . Insomnia 12/18/2018  . Left knee DJD   . PTSD (post-traumatic stress disorder)   . Restless leg syndrome       Family History  Problem Relation Age of Onset  . Stroke Mother   . Heart disease Father   . Heart attack Father   . Hypertension Father     Social History   Social History Narrative   Married 22 years in 2nd marriage.First marriage lasted 39 years.Retired,now a Theme park manager.   Social History   Tobacco Use  . Smoking status: Former Smoker    Packs/day: 1.50    Years: 35.00    Pack years: 52.50    Types: Cigarettes    Quit date: 04/10/2003    Years since quitting: 16.2  . Smokeless tobacco: Never Used  Substance Use Topics  . Alcohol use: No     Current Meds  Medication Sig  . acetaminophen (TYLENOL) 325 MG tablet Take 2 tablets (650 mg total) by mouth every 6 (six) hours as needed for mild pain (or Fever >/= 101).  Marland Kitchen ALPRAZolam (XANAX) 0.5 MG tablet Take 1 tablet (0.5 mg total) by mouth at bedtime  as needed for anxiety.  Marland Kitchen amitriptyline (ELAVIL) 50 MG tablet TAKE 1 TABLET EVERY DAY  . gabapentin (NEURONTIN) 300 MG capsule Take 2 capsules (600 mg total) by mouth at bedtime.  . NP THYROID 120 MG tablet TAKE 1 TABLET BY MOUTH ONCE DAILY BEFORE BREAKFAST  . [DISCONTINUED] amLODipine (NORVASC) 5 MG tablet Take 1 tablet (5 mg total) by mouth daily.  Marland Kitchen amLODipine (NORVASC) 2.5 MG tablet Take 1 tablet (2.5 mg total) by mouth daily.    ROS:  Negative unless otherwise stated in HPI   Objective:   Today's Vitals: BP 105/60 (BP Location: Left Arm, Patient Position: Sitting, Cuff Size: Normal)   Pulse 63   Temp 97.7 F (36.5 C) (Temporal)   Ht 5' (1.524 m)   Wt 134 lb (60.8 kg)   SpO2 97%   BMI 26.17 kg/m  Vitals with BMI 07/22/2019 06/17/2019 03/24/2019    Height 5' 0"  5' 0"  4' 11"   Weight 134 lbs 136 lbs 3 oz 136 lbs 13 oz  BMI 26.17 62.9 52.84  Systolic 132 440 102  Diastolic 60 65 60  Pulse 63 92 72     Physical Exam Vitals reviewed. Exam conducted with a chaperone present.  Constitutional:      Appearance: Normal appearance.  HENT:     Head: Normocephalic and atraumatic.     Right Ear: Tympanic membrane, ear canal and external ear normal.     Left Ear: Tympanic membrane, ear canal and external ear normal.  Eyes:     General:        Right eye: No discharge.        Left eye: No discharge.     Extraocular Movements: Extraocular movements intact.     Conjunctiva/sclera: Conjunctivae normal.     Pupils: Pupils are equal, round, and reactive to light.  Neck:     Vascular: No carotid bruit.  Cardiovascular:     Rate and Rhythm: Normal rate and regular rhythm.     Pulses: Normal pulses.     Heart sounds: Normal heart sounds. No murmur.  Pulmonary:     Effort: Pulmonary effort is normal.     Breath sounds: Normal breath sounds.  Chest:     Breasts: Breasts are symmetrical.        Right: Normal.        Left: Normal.  Abdominal:     General: Abdomen is flat. Bowel sounds are normal. There is no distension.     Palpations: Abdomen is soft. There is no mass.     Tenderness: There is no abdominal tenderness.  Musculoskeletal:        General: No tenderness.     Cervical back: Neck supple. No muscular tenderness.     Right lower leg: No edema.     Left lower leg: No edema.  Lymphadenopathy:     Cervical: No cervical adenopathy.     Upper Body:     Right upper body: No supraclavicular adenopathy.     Left upper body: No supraclavicular adenopathy.  Skin:    General: Skin is warm and dry.  Neurological:     General: No focal deficit present.     Mental Status: She is alert and oriented to person, place, and time.     Motor: No weakness.     Gait: Gait normal.  Psychiatric:        Mood and Affect: Mood normal.         Behavior: Behavior normal.  Judgment: Judgment normal.          Assessment and Plan   1. Encounter for general adult medical examination with abnormal findings   2. Need for tetanus booster   3. Encounter for hepatitis C screening test for low risk patient   4. Colon cancer screening   5. Essential hypertension, benign   6. Acquired hypothyroidism   7. Vitamin D deficiency disease   8. Screening for condition   9. Screening for diabetes mellitus   10. Screening for hyperlipidemia   11. Osteopenia, unspecified location   12. Screening for osteoporosis      Plan: 1.-4.,  6.-12.  We will get blood work for further evaluation today.  I will have a tetanus shot administered today.  We will screen for hepatitis C as part of her blood work today.  We will send for DEXA scan for evaluation of bone density.  As stated above she would like to undergo FIT for colon cancer screening.  I did let her know that if her testing comes back with any abnormalities the recommendation is to undergo diagnostic colonoscopy, and she tells me she is willing to do this if needed.  For now she will continue on her current medications other than amlodipine as discussed below as prescribed.  5.  She did mention some vague feelings of being off balance intermittently.  She does not seem overly concerned or distressed about this.  However her blood pressure is on the soft side.  We did discuss possibly reducing her antihypertensive dose to see if this improves the symptom.  I also recommended that she focus on hydrating well throughout the day.  She is interested in making this change.  We will reduce her amlodipine from 5 mg daily to 2.5 mg daily, and she will follow-up in 1 month to see if this has made a difference in her symptoms.   Tests ordered Orders Placed This Encounter  Procedures  . DG Bone Density  . Tdap vaccine greater than or equal to 7yo IM  . Hep C Antibody  . Fecal Globin By  Immunochemistry  . CBC  . CMP with eGFR([facility])  . Lipid Panel  . Hemoglobin A1c  . TSH  . T3, Free  . T4, Free  . Vitamin D, 25-hydroxy      Meds ordered this encounter  Medications  . amLODipine (NORVASC) 2.5 MG tablet    Sig: Take 1 tablet (2.5 mg total) by mouth daily.    Dispense:  30 tablet    Refill:  1    Order Specific Question:   Supervising Provider    Answer:   Doree Albee [[id number]]    Patient to follow-up in 1 month or sooner as needed.  Ailene Ards, NP

## 2019-07-23 ENCOUNTER — Other Ambulatory Visit (INDEPENDENT_AMBULATORY_CARE_PROVIDER_SITE_OTHER): Payer: Medicare HMO

## 2019-07-23 DIAGNOSIS — Z139 Encounter for screening, unspecified: Secondary | ICD-10-CM | POA: Diagnosis not present

## 2019-07-23 DIAGNOSIS — Z1211 Encounter for screening for malignant neoplasm of colon: Secondary | ICD-10-CM | POA: Diagnosis not present

## 2019-07-23 DIAGNOSIS — Z131 Encounter for screening for diabetes mellitus: Secondary | ICD-10-CM | POA: Diagnosis not present

## 2019-07-23 DIAGNOSIS — I10 Essential (primary) hypertension: Secondary | ICD-10-CM | POA: Diagnosis not present

## 2019-07-23 DIAGNOSIS — Z13220 Encounter for screening for lipoid disorders: Secondary | ICD-10-CM | POA: Diagnosis not present

## 2019-07-23 DIAGNOSIS — E039 Hypothyroidism, unspecified: Secondary | ICD-10-CM | POA: Diagnosis not present

## 2019-07-23 DIAGNOSIS — Z1159 Encounter for screening for other viral diseases: Secondary | ICD-10-CM | POA: Diagnosis not present

## 2019-07-23 DIAGNOSIS — E559 Vitamin D deficiency, unspecified: Secondary | ICD-10-CM | POA: Diagnosis not present

## 2019-07-23 LAB — TSH: TSH: 0.02 mIU/L — ABNORMAL LOW (ref 0.40–4.50)

## 2019-07-23 LAB — COMPLETE METABOLIC PANEL WITH GFR
AG Ratio: 1.9 (calc) (ref 1.0–2.5)
ALT: 24 U/L (ref 6–29)
AST: 25 U/L (ref 10–35)
Albumin: 4.3 g/dL (ref 3.6–5.1)
Alkaline phosphatase (APISO): 146 U/L (ref 37–153)
BUN: 16 mg/dL (ref 7–25)
CO2: 23 mmol/L (ref 20–32)
Calcium: 10.2 mg/dL (ref 8.6–10.4)
Chloride: 106 mmol/L (ref 98–110)
Creat: 0.81 mg/dL (ref 0.60–0.93)
GFR, Est African American: 85 mL/min/{1.73_m2} (ref >=60)
GFR, Est Non African American: 73 mL/min/{1.73_m2} (ref >=60)
Globulin: 2.3 g/dL (calc) (ref 1.9–3.7)
Glucose, Bld: 79 mg/dL (ref 65–99)
Potassium: 4.7 mmol/L (ref 3.5–5.3)
Sodium: 140 mmol/L (ref 135–146)
Total Bilirubin: 0.4 mg/dL (ref 0.2–1.2)
Total Protein: 6.6 g/dL (ref 6.1–8.1)

## 2019-07-23 LAB — LIPID PANEL
Cholesterol: 155 mg/dL (ref <200)
HDL: 53 mg/dL (ref >=50)
LDL Cholesterol (Calc): 86 mg/dL (calc)
Non-HDL Cholesterol (Calc): 102 mg/dL (calc) (ref <130)
Total CHOL/HDL Ratio: 2.9 (calc) (ref <5.0)
Triglycerides: 73 mg/dL (ref <150)

## 2019-07-23 LAB — T4, FREE: Free T4: 1.1 ng/dL (ref 0.8–1.8)

## 2019-07-23 LAB — VITAMIN D 25 HYDROXY (VIT D DEFICIENCY, FRACTURES): Vit D, 25-Hydroxy: 36 ng/mL (ref 30–100)

## 2019-07-23 LAB — T3, FREE: T3, Free: 5 pg/mL — ABNORMAL HIGH (ref 2.3–4.2)

## 2019-07-24 LAB — CBC
HCT: 39 % (ref 35.0–45.0)
Hemoglobin: 12.3 g/dL (ref 11.7–15.5)
MCH: 25.7 pg — ABNORMAL LOW (ref 27.0–33.0)
MCHC: 31.5 g/dL — ABNORMAL LOW (ref 32.0–36.0)
MCV: 81.6 fL (ref 80.0–100.0)
MPV: 10.1 fL (ref 7.5–12.5)
Platelets: 310 10*3/uL (ref 140–400)
RBC: 4.78 10*6/uL (ref 3.80–5.10)
RDW: 13.8 % (ref 11.0–15.0)
WBC: 7.1 10*3/uL (ref 3.8–10.8)

## 2019-07-24 LAB — HEPATITIS C ANTIBODY
Hepatitis C Ab: NONREACTIVE
SIGNAL TO CUT-OFF: 0.01 (ref <1.00)

## 2019-07-27 ENCOUNTER — Other Ambulatory Visit (INDEPENDENT_AMBULATORY_CARE_PROVIDER_SITE_OTHER): Payer: Self-pay | Admitting: Internal Medicine

## 2019-07-28 ENCOUNTER — Encounter (HOSPITAL_COMMUNITY): Payer: Self-pay

## 2019-07-28 ENCOUNTER — Encounter (HOSPITAL_COMMUNITY): Payer: Medicare HMO

## 2019-07-28 ENCOUNTER — Ambulatory Visit (HOSPITAL_COMMUNITY): Payer: Medicare HMO

## 2019-08-04 ENCOUNTER — Encounter (INDEPENDENT_AMBULATORY_CARE_PROVIDER_SITE_OTHER): Payer: Self-pay | Admitting: Internal Medicine

## 2019-08-04 ENCOUNTER — Ambulatory Visit (HOSPITAL_COMMUNITY)
Admission: RE | Admit: 2019-08-04 | Discharge: 2019-08-04 | Disposition: A | Discharge status: Home / Self Care | Payer: Medicare HMO | Source: Ambulatory Visit | Attending: Nurse Practitioner | Admitting: Nurse Practitioner

## 2019-08-04 ENCOUNTER — Other Ambulatory Visit: Payer: Self-pay

## 2019-08-04 ENCOUNTER — Telehealth (INDEPENDENT_AMBULATORY_CARE_PROVIDER_SITE_OTHER): Payer: Self-pay | Admitting: Internal Medicine

## 2019-08-04 ENCOUNTER — Ambulatory Visit (HOSPITAL_COMMUNITY)
Admission: RE | Admit: 2019-08-04 | Discharge: 2019-08-04 | Disposition: A | Discharge status: Home / Self Care | Payer: Medicare HMO | Source: Ambulatory Visit | Attending: Internal Medicine | Admitting: Internal Medicine

## 2019-08-04 ENCOUNTER — Telehealth (INDEPENDENT_AMBULATORY_CARE_PROVIDER_SITE_OTHER): Payer: Medicare HMO | Admitting: Internal Medicine

## 2019-08-04 DIAGNOSIS — M8580 Other specified disorders of bone density and structure, unspecified site: Secondary | ICD-10-CM

## 2019-08-04 DIAGNOSIS — J0100 Acute maxillary sinusitis, unspecified: Secondary | ICD-10-CM

## 2019-08-04 DIAGNOSIS — Z13820 Encounter for screening for osteoporosis: Secondary | ICD-10-CM | POA: Diagnosis not present

## 2019-08-04 DIAGNOSIS — Z780 Asymptomatic menopausal state: Secondary | ICD-10-CM | POA: Diagnosis not present

## 2019-08-04 DIAGNOSIS — R928 Other abnormal and inconclusive findings on diagnostic imaging of breast: Secondary | ICD-10-CM | POA: Diagnosis not present

## 2019-08-04 DIAGNOSIS — M810 Age-related osteoporosis without current pathological fracture: Secondary | ICD-10-CM | POA: Insufficient documentation

## 2019-08-04 DIAGNOSIS — Z87891 Personal history of nicotine dependence: Secondary | ICD-10-CM

## 2019-08-04 DIAGNOSIS — N6489 Other specified disorders of breast: Secondary | ICD-10-CM | POA: Diagnosis not present

## 2019-08-04 MED ORDER — AMOXICILLIN-POT CLAVULANATE 875-125 MG PO TABS: 1.0000 | ORAL_TABLET | Freq: Two times a day (BID) | ORAL | 0 refills | Status: DC

## 2019-08-04 NOTE — Progress Notes (Signed)
Metrics: Intervention Frequency ACO  Documented Smoking Status Yearly  Screened one or more times in 24 months  Cessation Counseling or  Active cessation medication Past 24 months  Past 24 months   Guideline developer: UpToDate (See UpToDate for funding source) Date Released: 2014       Wellness Office Visit  Subjective:  Patient ID: [name] [name], female    DOB: [birth year]  Age: 74 y.o. MRN: [id number]  CC: This is an audio telemedicine visit with the permission of the patient who is at home and I am in my office.  I used 2 identifiers to identify the patient. Her chief complaint is postnasal drainage and facial congestion. HPI  She has had the above symptoms for the last few days.  She denies any fever, body aches or dyspnea. Past Medical History:  Diagnosis Date  . COPD (chronic obstructive pulmonary disease) ([facility])   . Depression   . Essential hypertension, benign 01/21/2019  . History of pneumonia    2010 on ventalator  . History of recurrent UTIs   . Hypothyroidism   . Insomnia 12/18/2018  . Left knee DJD   . PTSD (post-traumatic stress disorder)   . Restless leg syndrome       Family History  Problem Relation Age of Onset  . Stroke Mother   . Heart disease Father   . Heart attack Father   . Hypertension Father     Social History   Social History Narrative   Married 22 years in 2nd marriage.First marriage lasted 31 years.Retired,now a Theme park manager.   Social History   Tobacco Use  . Smoking status: Former Smoker    Packs/day: 1.50    Years: 35.00    Pack years: 52.50    Types: Cigarettes    Quit date: 04/10/2003    Years since quitting: 16.3  . Smokeless tobacco: Never Used  Substance Use Topics  . Alcohol use: No    Current Meds  Medication Sig  . acetaminophen (TYLENOL) 325 MG tablet Take 2 tablets (650 mg total) by mouth every 6 (six) hours as needed for mild pain (or Fever >/= 101).  Marland Kitchen ALPRAZolam (XANAX) 0.5 MG tablet Take 1 tablet (0.5 mg total) by  mouth at bedtime as needed for anxiety.  Marland Kitchen amLODipine (NORVASC) 2.5 MG tablet Take 1 tablet (2.5 mg total) by mouth daily.  Marland Kitchen gabapentin (NEURONTIN) 300 MG capsule Take 2 capsules (600 mg total) by mouth at bedtime.  . NP THYROID 120 MG tablet TAKE 1 TABLET BY MOUTH ONCE DAILY BEFORE BREAKFAST      Objective:   Today's Vitals: There were no vitals taken for this visit. Vitals with BMI 08/04/2019 07/22/2019 06/17/2019  Height (No Data) 5\' 0"  5\' 0"   Weight (No Data) 134 lbs 136 lbs 3 oz  BMI - 96.78 93.8  Systolic (No Data) 101 751  Diastolic (No Data) 60 65  Pulse - 63 92     Physical Exam  Her speech is normal on the phone and she appears to be alert and orientated.  She was able to elicit a degree of tenderness/sensitivity in her maxillary sinuses by tapping on her sinuses with her index finger.     Assessment   1. Acute non-recurrent maxillary sinusitis       Tests ordered No orders of the defined types were placed in this encounter.    Plan: 1. I think she likely has a sinusitis and we will treat her with antibiotics.  I also  recommended that she take over-the-counter allergy medicine such as Claritin, Allegra or Zyrtec. 2. If she does not improve in about a week's time, she will call us back. 3. This phone call lasted 5 minutes and 2 seconds.   Meds ordered this encounter  Medications  . amoxicillin-clavulanate (AUGMENTIN) 875-125 MG tablet    Sig: Take 1 tablet by mouth 2 (two) times daily.    Dispense:  20 tablet    Refill:  0    Bertine Schlottman Normajean Glasgow, MD

## 2019-08-04 NOTE — Telephone Encounter (Signed)
I can see her today via telemedicine, preferably video, at the end of the day around 4:20 p.m. She is fully vaccinated so this is unlikely to be COVID-19, but still possible.

## 2019-08-06 ENCOUNTER — Encounter (INDEPENDENT_AMBULATORY_CARE_PROVIDER_SITE_OTHER): Payer: Self-pay | Admitting: Nurse Practitioner

## 2019-08-07 ENCOUNTER — Other Ambulatory Visit (HOSPITAL_COMMUNITY): Payer: Medicare HMO

## 2019-08-20 ENCOUNTER — Other Ambulatory Visit: Payer: Self-pay

## 2019-08-20 ENCOUNTER — Ambulatory Visit (INDEPENDENT_AMBULATORY_CARE_PROVIDER_SITE_OTHER): Payer: Medicare HMO | Admitting: Nurse Practitioner

## 2019-08-20 VITALS — BP 130/68 | HR 83 | Temp 97.8°F | Resp 15 | Ht 60.0 in | Wt 133.8 lb

## 2019-08-20 DIAGNOSIS — E039 Hypothyroidism, unspecified: Secondary | ICD-10-CM

## 2019-08-20 DIAGNOSIS — I10 Essential (primary) hypertension: Secondary | ICD-10-CM

## 2019-08-20 DIAGNOSIS — R928 Other abnormal and inconclusive findings on diagnostic imaging of breast: Secondary | ICD-10-CM

## 2019-08-20 DIAGNOSIS — M810 Age-related osteoporosis without current pathological fracture: Secondary | ICD-10-CM | POA: Diagnosis not present

## 2019-08-20 MED ORDER — AMLODIPINE BESYLATE 2.5 MG PO TABS: 2.5000 mg | ORAL_TABLET | Freq: Every day | ORAL | 0 refills | Status: DC

## 2019-08-20 MED ORDER — LEVOTHYROXINE SODIUM 88 MCG PO TABS: 88.0000 ug | ORAL_TABLET | Freq: Every day | ORAL | 0 refills | Status: DC

## 2019-08-20 NOTE — Patient Instructions (Addendum)
1200mg  of calcium/day; look for a 600mg  supplement. Take 1 per day.

## 2019-08-20 NOTE — Progress Notes (Signed)
Subjective:  Patient ID: [name] [name], female    DOB: [birth year]  Age: 74 y.o. MRN: [id number]  CC:  Chief Complaint  Patient presents with  . Hypertension    follow up after starting new bp med   . Other    Osteoporosis, health maintenance screenings  . Hypothyroidism      HPI  This patient comes in today for the above.  Hypertension: She is a history of hypertension, and at her last office visit we reduced her amlodipine from 5 mg daily to 2.5mg  daily.  This was because she was having some episodes of dizziness and her blood pressure was on the lower side of normal at that time.  Since reducing to 2.5 mg daily of the amlodipine she is feeling well, and has not been experiencing the dizziness as often.  Osteoporosis: She underwent bone density scanning since last time I saw her and it was noted that she is positive for osteoporosis.  She tells me since that appointment she started taking 5000 IUs of vitamin D3 by mouth daily and is wondering if she should also take a calcium supplement.  She tells me she did pick 1 up at the store but she has not started taking it.  Health maintenance screenings: She also underwent breast cancer screening and it was noted that she did have an asymmetry to her right breast.  Recommendation was for her to follow-up in 6 months which would be approximately October 2021.  Hypothyroidism: She has a history of hypothyroidism and continues on NP thyroid.  She tells me that this medication is quite expensive and she tolerated her levothyroxine fairly well in the past.  She is wondering if she could change back to that.   Past Medical History:  Diagnosis Date  . COPD (chronic obstructive pulmonary disease) (HCC)   . Depression   . Essential hypertension, benign 01/21/2019  . History of pneumonia    2010 on ventalator  . History of recurrent UTIs   . Hypothyroidism   . Insomnia 12/18/2018  . Left knee DJD   . Osteoporosis   . PTSD  (post-traumatic stress disorder)   . Restless leg syndrome       Family History  Problem Relation Age of Onset  . Stroke Mother   . Heart disease Father   . Heart attack Father   . Hypertension Father     Social History   Social History Narrative   Married 22 years in 2nd marriage.First marriage lasted 20 years.Retired,now a Education officer, environmental.   Social History   Tobacco Use  . Smoking status: Former Smoker    Packs/day: 1.50    Years: 35.00    Pack years: 52.50    Types: Cigarettes    Quit date: 04/10/2003    Years since quitting: 16.3  . Smokeless tobacco: Never Used  Substance Use Topics  . Alcohol use: No     Current Meds  Medication Sig  . acetaminophen (TYLENOL) 325 MG tablet Take 2 tablets (650 mg total) by mouth every 6 (six) hours as needed for mild pain (or Fever >/= 101).  Marland Kitchen ALPRAZolam (XANAX) 0.5 MG tablet Take 1 tablet (0.5 mg total) by mouth at bedtime as needed for anxiety.  Marland Kitchen amitriptyline (ELAVIL) 50 MG tablet TAKE 1 TABLET EVERY DAY  . amLODipine (NORVASC) 2.5 MG tablet Take 1 tablet (2.5 mg total) by mouth daily.  Marland Kitchen gabapentin (NEURONTIN) 300 MG capsule Take 2 capsules (600 mg total) by  mouth at bedtime.  . [DISCONTINUED] amLODipine (NORVASC) 2.5 MG tablet Take 1 tablet (2.5 mg total) by mouth daily.  . [DISCONTINUED] NP THYROID 120 MG tablet TAKE 1 TABLET BY MOUTH ONCE DAILY BEFORE BREAKFAST    ROS:  Review of Systems  Constitutional: Negative.   Eyes: Negative for blurred vision and double vision.  Respiratory: Negative for cough, shortness of breath and wheezing.   Cardiovascular: Negative for chest pain and palpitations.  Neurological: Negative for dizziness and headaches.     Objective:   Today's Vitals: BP 130/68   Pulse 83   Temp 97.8 F (36.6 C) (Temporal)   Resp 15   Ht 5' (1.524 m)   Wt 133 lb 12.8 oz (60.7 kg)   SpO2 98%   BMI 26.13 kg/m  Vitals with BMI 08/20/2019 08/04/2019 07/22/2019  Height 5\' 0"  (No Data) 5\' 0"   Weight 133 lbs 13  oz (No Data) 134 lbs  BMI 32.95 - 18.84  Systolic 166 (No Data) 063  Diastolic 68 (No Data) 60  Pulse 83 - 63     Physical Exam Vitals reviewed.  Constitutional:      General: She is not in acute distress.    Appearance: Normal appearance.  HENT:     Head: Normocephalic and atraumatic.  Neck:     Vascular: No carotid bruit.  Cardiovascular:     Rate and Rhythm: Normal rate and regular rhythm.     Pulses: Normal pulses.     Heart sounds: Normal heart sounds.  Pulmonary:     Effort: Pulmonary effort is normal.     Breath sounds: Normal breath sounds.  Skin:    General: Skin is warm and dry.  Neurological:     General: No focal deficit present.     Mental Status: She is alert and oriented to person, place, and time.  Psychiatric:        Mood and Affect: Mood normal.        Behavior: Behavior normal.        Judgment: Judgment normal.          Assessment and Plan   1. Hypothyroidism, unspecified type   2. Essential hypertension, benign   3. Osteoporosis without current pathological fracture, unspecified osteoporosis type   4. Abnormal mammogram of right breast      Plan: 1.  Via shared decision making the patient would like to go back on her levothyroxine.  She tells me she was on 88 mcg in the past.  I will send prescription of levothyroxine to her pharmacy, and I told her when she gets this filled she can stop her NP thyroid and restart her levothyroxine.  I told her if she feels unwell on this dose she can call this office.  She tells me she understands.  She will follow-up in about 6 weeks for blood work and then again in 8 weeks for office visit.  2.  Blood pressure is acceptable on amlodipine 2-1/2 mg daily.  She is not feeling dizzy like she was before.  She will continue on the 2-1/2 mg as opposed to 5 mg daily.  3.  She will continue taking her vitamin D3 supplement, she tells me she is only just started taking it thus I will wait to check a vitamin D serum  level and metabolic panel until her lab follow-up in 6 weeks.  I encouraged her to take approximately 600 mg of calcium via a supplement and focus on getting dietary sources  as well for a total of 1200 mg of calcium daily.  She tells me she understands and will try to do this.  4.  I will send a reminder to myself that she will be due for imaging again in October 2021 to monitor asymmetry noted on her right breast.  Tests ordered No orders of the defined types were placed in this encounter.     Meds ordered this encounter  Medications  . levothyroxine (SYNTHROID) 88 MCG tablet    Sig: Take 1 tablet (88 mcg total) by mouth daily.    Dispense:  90 tablet    Refill:  0    Order Specific Question:   Supervising Provider    Answer:   Lilly Cove C [[id number]]  . amLODipine (NORVASC) 2.5 MG tablet    Sig: Take 1 tablet (2.5 mg total) by mouth daily.    Dispense:  90 tablet    Refill:  0    Order Specific Question:   Supervising Provider    Answer:   Wilson Singer [[id number]]    Patient to follow-up in 6 weeks for lab work, and 8 weeks for office visit.  She will be due for lab work at that time including thyroid panel, metabolic panel, and vitamin D serum check.  Elenore Paddy, NP

## 2019-09-08 DIAGNOSIS — L820 Inflamed seborrheic keratosis: Secondary | ICD-10-CM | POA: Diagnosis not present

## 2019-09-08 DIAGNOSIS — L821 Other seborrheic keratosis: Secondary | ICD-10-CM | POA: Diagnosis not present

## 2019-09-25 ENCOUNTER — Other Ambulatory Visit (INDEPENDENT_AMBULATORY_CARE_PROVIDER_SITE_OTHER): Payer: Self-pay | Admitting: Internal Medicine

## 2019-09-28 ENCOUNTER — Other Ambulatory Visit (INDEPENDENT_AMBULATORY_CARE_PROVIDER_SITE_OTHER): Payer: Self-pay | Admitting: Internal Medicine

## 2019-09-28 MED ORDER — AMITRIPTYLINE HCL 50 MG PO TABS: 50.0000 mg | ORAL_TABLET | Freq: Every day | ORAL | 0 refills | Status: DC

## 2019-09-28 NOTE — Telephone Encounter (Signed)
Pt is requesting a refill on the following medication.  amitriptyline (ELAVIL) 50 MG tablet   [facility] - [place], [place] - [address] Phone:  [phone]  Fax:  [phone]

## 2019-09-29 ENCOUNTER — Other Ambulatory Visit (INDEPENDENT_AMBULATORY_CARE_PROVIDER_SITE_OTHER): Payer: Self-pay

## 2019-09-29 MED ORDER — GABAPENTIN 300 MG PO CAPS: 600.0000 mg | ORAL_CAPSULE | Freq: Every day | ORAL | 1 refills | Status: DC

## 2019-09-29 NOTE — Telephone Encounter (Signed)
1 week supply with one refill e-prescribed to [facility]

## 2019-09-29 NOTE — Telephone Encounter (Signed)
[name] [name] is asking if you can call her in enough of the Gabapentin to [facility] until she can get her Rx from [facility] she is completely out, please advise?

## 2019-10-08 ENCOUNTER — Other Ambulatory Visit (INDEPENDENT_AMBULATORY_CARE_PROVIDER_SITE_OTHER): Payer: Self-pay | Admitting: Nurse Practitioner

## 2019-10-08 ENCOUNTER — Other Ambulatory Visit (INDEPENDENT_AMBULATORY_CARE_PROVIDER_SITE_OTHER): Payer: Medicare HMO

## 2019-10-08 ENCOUNTER — Other Ambulatory Visit: Payer: Self-pay

## 2019-10-08 DIAGNOSIS — E039 Hypothyroidism, unspecified: Secondary | ICD-10-CM

## 2019-10-08 LAB — TSH: TSH: 0.25 mIU/L — ABNORMAL LOW (ref 0.40–4.50)

## 2019-10-09 ENCOUNTER — Other Ambulatory Visit (INDEPENDENT_AMBULATORY_CARE_PROVIDER_SITE_OTHER): Payer: Self-pay | Admitting: Nurse Practitioner

## 2019-10-09 DIAGNOSIS — E039 Hypothyroidism, unspecified: Secondary | ICD-10-CM

## 2019-10-09 MED ORDER — LEVOTHYROXINE SODIUM 75 MCG PO TABS: 75.0000 ug | ORAL_TABLET | Freq: Every day | ORAL | 2 refills | Status: DC

## 2019-10-13 ENCOUNTER — Other Ambulatory Visit (INDEPENDENT_AMBULATORY_CARE_PROVIDER_SITE_OTHER): Payer: Self-pay | Admitting: Nurse Practitioner

## 2019-10-13 DIAGNOSIS — I10 Essential (primary) hypertension: Secondary | ICD-10-CM

## 2019-10-16 DIAGNOSIS — L821 Other seborrheic keratosis: Secondary | ICD-10-CM | POA: Diagnosis not present

## 2019-10-16 DIAGNOSIS — L812 Freckles: Secondary | ICD-10-CM | POA: Diagnosis not present

## 2019-10-17 ENCOUNTER — Other Ambulatory Visit (INDEPENDENT_AMBULATORY_CARE_PROVIDER_SITE_OTHER): Payer: Self-pay | Admitting: Nurse Practitioner

## 2019-10-19 ENCOUNTER — Other Ambulatory Visit (INDEPENDENT_AMBULATORY_CARE_PROVIDER_SITE_OTHER): Payer: Self-pay

## 2019-10-19 DIAGNOSIS — E039 Hypothyroidism, unspecified: Secondary | ICD-10-CM

## 2019-10-19 MED ORDER — LEVOTHYROXINE SODIUM 75 MCG PO TABS: 75.0000 ug | ORAL_TABLET | Freq: Every day | ORAL | 2 refills | Status: DC

## 2019-10-26 ENCOUNTER — Ambulatory Visit (INDEPENDENT_AMBULATORY_CARE_PROVIDER_SITE_OTHER): Payer: Medicare HMO | Admitting: Nurse Practitioner

## 2019-10-26 ENCOUNTER — Other Ambulatory Visit: Payer: Self-pay

## 2019-10-26 ENCOUNTER — Encounter (INDEPENDENT_AMBULATORY_CARE_PROVIDER_SITE_OTHER): Payer: Self-pay | Admitting: Nurse Practitioner

## 2019-10-26 VITALS — BP 135/60 | HR 90 | Temp 97.3°F | Ht 60.0 in | Wt 139.0 lb

## 2019-10-26 DIAGNOSIS — H539 Unspecified visual disturbance: Secondary | ICD-10-CM

## 2019-10-26 DIAGNOSIS — E559 Vitamin D deficiency, unspecified: Secondary | ICD-10-CM | POA: Diagnosis not present

## 2019-10-26 DIAGNOSIS — E039 Hypothyroidism, unspecified: Secondary | ICD-10-CM | POA: Diagnosis not present

## 2019-10-26 DIAGNOSIS — G2581 Restless legs syndrome: Secondary | ICD-10-CM | POA: Diagnosis not present

## 2019-10-26 DIAGNOSIS — R928 Other abnormal and inconclusive findings on diagnostic imaging of breast: Secondary | ICD-10-CM

## 2019-10-26 DIAGNOSIS — I10 Essential (primary) hypertension: Secondary | ICD-10-CM | POA: Diagnosis not present

## 2019-10-26 MED ORDER — GABAPENTIN 300 MG PO CAPS: 600.0000 mg | ORAL_CAPSULE | Freq: Every day | ORAL | 0 refills | Status: DC

## 2019-10-26 MED ORDER — AMLODIPINE BESYLATE 2.5 MG PO TABS: 2.5000 mg | ORAL_TABLET | Freq: Every day | ORAL | 0 refills | Status: DC

## 2019-10-26 NOTE — Progress Notes (Signed)
Subjective:  Patient ID: [name] [name], female    DOB: 03-Jun-1945  Age: 74 y.o. MRN: [id number]  CC:  Chief Complaint  Patient presents with  . Hypothyroidism  . Hypertension  . Follow-up  . Other    Vitamin D deficiency, asymmetry last mammogram      HPI  This patient comes in today for the above.  We reduced her levothyroxine to 75 mcg earlier this month due to her most recent blood work.  She tells me she taken this medication dose and tolerating it well.  She also has a history of hypertension and continues on amlodipine 2.5 mg daily.  She is wondering if she can stop taking her antihypertensive altogether.  She was having some episodes of dizziness previously, at that time we reduced her amlodipine down to 2.5 mg and tells me the dizziness is pretty much resolved completely.  She continues on vitamin D3 supplement for vitamin D deficiency.  Last serum check was completed in April and it was 81.  On her last mammogram asymmetry of her right breast was noted and it was recommended she follow-up in or around October for repeat mammogram.  She also mentions that she will have "spells" of hazy vision into her peripheral vision.  She wonders if this could be ocular migraines.  She does note that she gets a headache with the spells.  She tells me the spells last for a few hours and will eventually go away on their own.  She does not note any other neurological deficits, eye pain, redness to her eye, etc.  She has seen an eye doctor within the last year and tells me that she was not notified whether or not she has glaucoma, but she is not sure if she was tested for.  She tells me at a previous eye doctor she was told she has glaucoma in the past.  She is not on any eyedrops currently.   Past Medical History:  Diagnosis Date  . COPD (chronic obstructive pulmonary disease) (HCC)   . Depression   . Essential hypertension, benign 01/21/2019  . History of pneumonia    2010 on ventalator    . History of recurrent UTIs   . Hypothyroidism   . Insomnia 12/18/2018  . Left knee DJD   . Osteoporosis   . PTSD (post-traumatic stress disorder)   . Restless leg syndrome       Family History  Problem Relation Age of Onset  . Stroke Mother   . Heart disease Father   . Heart attack Father   . Hypertension Father     Social History   Social History Narrative   Married 22 years in 2nd marriage.First marriage lasted 20 years.Retired,now a Education officer, environmental.   Social History   Tobacco Use  . Smoking status: Former Smoker    Packs/day: 1.50    Years: 35.00    Pack years: 52.50    Types: Cigarettes    Quit date: 04/10/2003    Years since quitting: 16.5  . Smokeless tobacco: Never Used  Substance Use Topics  . Alcohol use: No     Current Meds  Medication Sig  . acetaminophen (TYLENOL) 325 MG tablet Take 2 tablets (650 mg total) by mouth every 6 (six) hours as needed for mild pain (or Fever >/= 101).  Marland Kitchen ALPRAZolam (XANAX) 0.5 MG tablet Take 1 tablet (0.5 mg total) by mouth at bedtime as needed for anxiety.  Marland Kitchen amitriptyline (ELAVIL) 50 MG  tablet Take 1 tablet (50 mg total) by mouth daily.  Marland Kitchen amLODipine (NORVASC) 2.5 MG tablet Take 1 tablet (2.5 mg total) by mouth daily.  . Cholecalciferol 1.25 MG (50000 UT) TABS Take 1 tablet by mouth daily.  Marland Kitchen gabapentin (NEURONTIN) 300 MG capsule Take 2 capsules (600 mg total) by mouth at bedtime.  Marland Kitchen levothyroxine (SYNTHROID) 75 MCG tablet Take 1 tablet (75 mcg total) by mouth daily.  . [DISCONTINUED] amLODipine (NORVASC) 2.5 MG tablet TAKE 1 TABLET EVERY DAY  . [DISCONTINUED] gabapentin (NEURONTIN) 300 MG capsule TAKE 2 CAPSULES AT BEDTIME    ROS:  Review of Systems  Constitutional: Negative for fever and weight loss.  Eyes: Negative for blurred vision.  Respiratory: Negative for cough and shortness of breath.   Cardiovascular: Negative for chest pain and palpitations.  Neurological: Positive for headaches. Negative for dizziness.      Objective:   Today's Vitals: BP 135/60 (BP Location: Left Arm, Patient Position: Sitting, Cuff Size: Normal)   Pulse 90   Temp (!) 97.3 F (36.3 C) (Temporal)   Ht 5' (1.524 m)   Wt 139 lb (63 kg)   SpO2 92%   BMI 27.15 kg/m  Vitals with BMI 10/26/2019 08/20/2019 08/04/2019  Height 5\' 0"  5\' 0"  (No Data)  Weight 139 lbs 133 lbs 13 oz (No Data)  BMI 27.15 26.13 -  Systolic 135 130 (No Data)  Diastolic 60 68 (No Data)  Pulse 90 83 -     Physical Exam Vitals reviewed.  Constitutional:      General: She is not in acute distress.    Appearance: Normal appearance.  HENT:     Head: Normocephalic and atraumatic.  Neck:     Vascular: No carotid bruit.  Cardiovascular:     Rate and Rhythm: Normal rate and regular rhythm.     Pulses: Normal pulses.     Heart sounds: Normal heart sounds.  Pulmonary:     Effort: Pulmonary effort is normal.     Breath sounds: Normal breath sounds.  Skin:    General: Skin is warm and dry.  Neurological:     General: No focal deficit present.     Mental Status: She is alert and oriented to person, place, and time.  Psychiatric:        Mood and Affect: Mood normal.        Behavior: Behavior normal.        Judgment: Judgment normal.          Assessment and Plan   1. Essential hypertension, benign   2. Restless leg syndrome   3. Visual disturbance   4. Hypothyroidism, unspecified type   5. Vitamin D deficiency disease   6. Abnormal mammogram of right breast      Plan: 1.  I am okay with her stopping her amlodipine completely, however she is going out of town for approximately 6 weeks within the next week and a half.  I do not want her to have uncontrolled hypertension while she is on vacation, thus per shared decision making we decided she will continue on her amlodipine until she returns home from her vacation.  At that time she will stop the amlodipine and then will follow-up in approximately 2 weeks to keep a close eye on her  blood pressure.  2.  She is requesting refill for her gabapentin for treatment of her restless leg syndrome.  Refill sent to her pharmacy.  3.  Etiology of her visual changes is  unclear at this time.  She tells me that she does not have the spells very frequently and they can be separated by months at a time.  I recommend she continue to follow-up with her eye doctor as scheduled.  I also recommend she try Excedrin over-the-counter to see if this relieves her symptoms, as this may abort a migraine which may relieve her visual disturbances if she is concerned about ocular migraines.  I also did inform her that visual changes especially acutely can be a sign of stroke, thus she may need to consider emergent evaluation if these symptoms persist and do not go away on their own.  4.  She will be due to have her thyroid panel rechecked at follow-up in October when she returns back from vacation.  For now should continue on her current dose of levothyroxine as prescribed.  5.  She will continue on her vitamin D 3 supplement, consider checking serum level at next office visit.  6.  She will be due for follow-up mammogram again in October of this year.  I have sent a reminder to myself so that we can make sure this gets scheduled appropriately.   Tests ordered No orders of the defined types were placed in this encounter.     Meds ordered this encounter  Medications  . amLODipine (NORVASC) 2.5 MG tablet    Sig: Take 1 tablet (2.5 mg total) by mouth daily.    Dispense:  90 tablet    Refill:  0    Order Specific Question:   Supervising Provider    Answer:   Lilly Cove C [[id number]]  . gabapentin (NEURONTIN) 300 MG capsule    Sig: Take 2 capsules (600 mg total) by mouth at bedtime.    Dispense:  180 capsule    Refill:  0    Order Specific Question:   Supervising Provider    Answer:   Wilson Singer [[id number]]    Patient to follow-up in in October which will be about 2 weeks after she returns  from vacation.  Elenore Paddy, NP

## 2019-10-26 NOTE — Patient Instructions (Signed)
Continue taking your amlodipine until you come back from vacation. Then stop your amlodipine, and follow-up as scheduled to keep a check on your blood pressure.

## 2019-12-01 ENCOUNTER — Other Ambulatory Visit (INDEPENDENT_AMBULATORY_CARE_PROVIDER_SITE_OTHER): Payer: Self-pay

## 2019-12-01 DIAGNOSIS — I10 Essential (primary) hypertension: Secondary | ICD-10-CM

## 2019-12-01 DIAGNOSIS — G2581 Restless legs syndrome: Secondary | ICD-10-CM

## 2019-12-02 MED ORDER — AMLODIPINE BESYLATE 2.5 MG PO TABS: 2.5000 mg | ORAL_TABLET | Freq: Every day | ORAL | 0 refills | Status: DC

## 2019-12-02 MED ORDER — GABAPENTIN 300 MG PO CAPS
600.0000 mg | ORAL_CAPSULE | Freq: Every day | ORAL | 0 refills | Status: DC
Start: 2019-12-02 — End: 2020-06-18

## 2019-12-02 MED ORDER — AMITRIPTYLINE HCL 50 MG PO TABS: 50.0000 mg | ORAL_TABLET | Freq: Every day | ORAL | 0 refills | Status: DC

## 2019-12-17 ENCOUNTER — Other Ambulatory Visit (INDEPENDENT_AMBULATORY_CARE_PROVIDER_SITE_OTHER): Payer: Self-pay | Admitting: Internal Medicine

## 2019-12-17 DIAGNOSIS — E039 Hypothyroidism, unspecified: Secondary | ICD-10-CM

## 2020-01-04 ENCOUNTER — Other Ambulatory Visit (HOSPITAL_COMMUNITY): Payer: Self-pay | Admitting: Internal Medicine

## 2020-01-04 ENCOUNTER — Telehealth (INDEPENDENT_AMBULATORY_CARE_PROVIDER_SITE_OTHER): Payer: Self-pay | Admitting: Nurse Practitioner

## 2020-01-04 DIAGNOSIS — R928 Other abnormal and inconclusive findings on diagnostic imaging of breast: Secondary | ICD-10-CM

## 2020-01-04 NOTE — Telephone Encounter (Signed)
Please call patient and notify her that per her chart she is due to have repeat mammogram of right breast to follow-up on palpable benign lesion.  She will be due for this in approximately 1 month, ask her if she would like me to send referral to get this ordered.  If so, let me know and I will order today.

## 2020-01-04 NOTE — Telephone Encounter (Signed)
Pt called back so she I calling now to setup appointment.

## 2020-01-04 NOTE — Telephone Encounter (Signed)
Okay, thank you for letting me know. If she has any difficulty calling over there to set up an appointment let me know and I will place the order.

## 2020-01-11 ENCOUNTER — Encounter (INDEPENDENT_AMBULATORY_CARE_PROVIDER_SITE_OTHER): Payer: Self-pay | Admitting: Internal Medicine

## 2020-01-11 ENCOUNTER — Ambulatory Visit (INDEPENDENT_AMBULATORY_CARE_PROVIDER_SITE_OTHER): Payer: Medicare HMO | Admitting: Internal Medicine

## 2020-01-11 ENCOUNTER — Other Ambulatory Visit: Payer: Self-pay

## 2020-01-11 VITALS — BP 140/72 | HR 78 | Temp 97.3°F | Resp 18 | Ht 60.0 in | Wt 139.0 lb

## 2020-01-11 DIAGNOSIS — E039 Hypothyroidism, unspecified: Secondary | ICD-10-CM | POA: Diagnosis not present

## 2020-01-11 DIAGNOSIS — I10 Essential (primary) hypertension: Secondary | ICD-10-CM

## 2020-01-11 DIAGNOSIS — E559 Vitamin D deficiency, unspecified: Secondary | ICD-10-CM

## 2020-01-11 NOTE — Progress Notes (Signed)
Metrics: Intervention Frequency ACO  Documented Smoking Status Yearly  Screened one or more times in 24 months  Cessation Counseling or  Active cessation medication Past 24 months  Past 24 months   Guideline developer: UpToDate (See UpToDate for funding source) Date Released: 2014       Wellness Office Visit  Subjective:  Patient ID: [name] [name], female    DOB: 11/10/1945  Age: 74 y.o. MRN: [id number]  CC: This lady comes in for follow-up of hypertension, hypothyroidism and vitamin D deficiency. HPI  She has been doing reasonably well.  Maralyn Sago was concerned that her blood pressure was elevated and she continues on amlodipine 2.5 mg daily.  She was on higher dose but this made her feel somewhat unsteady on her feet.  Reducing the doses eliminated the symptoms. She continues on levothyroxine 75 mcg daily and also vitamin D3 supplementation. Past Medical History:  Diagnosis Date  . COPD (chronic obstructive pulmonary disease) (HCC)   . Depression   . Essential hypertension, benign 01/21/2019  . History of pneumonia    2010 on ventalator  . History of recurrent UTIs   . Hypothyroidism   . Insomnia 12/18/2018  . Left knee DJD   . Osteoporosis   . PTSD (post-traumatic stress disorder)   . Restless leg syndrome    Past Surgical History:  Procedure Laterality Date  . ABDOMINAL HYSTERECTOMY    . KNEE ARTHROSCOPY WITH MEDIAL MENISECTOMY Left 08/24/2013   Procedure: LEFT KNEE ARTHROSCOPY WITH MEDIAL MENISCECTOMY  ;  Surgeon: Nilda Simmer, MD;  Location: [facility] OR;  Service: Orthopedics;  Laterality: Left;  . PARTIAL KNEE ARTHROPLASTY Left 08/24/2013   dr Denton Ar  . PARTIAL KNEE ARTHROPLASTY Left 08/24/2013   Procedure: LEFT KNEE ARTHROPLASTY UNI COMPARTMENTAL ;  Surgeon: Nilda Simmer, MD;  Location: [facility] OR;  Service: Orthopedics;  Laterality: Left;  Left knee arthroplasty uni compartment  With left knee scope medial meniscectomy    . TOTAL VAGINAL HYSTERECTOMY       Family History    Problem Relation Age of Onset  . Stroke Mother   . Heart disease Father   . Heart attack Father   . Hypertension Father     Social History   Social History Narrative   Married 22 years in 2nd marriage.First marriage lasted 20 years.Retired,now a Education officer, environmental.   Social History   Tobacco Use  . Smoking status: Former Smoker    Packs/day: 1.50    Years: 35.00    Pack years: 52.50    Types: Cigarettes    Quit date: 04/10/2003    Years since quitting: 16.7  . Smokeless tobacco: Never Used  Substance Use Topics  . Alcohol use: No    Current Meds  Medication Sig  . acetaminophen (TYLENOL) 325 MG tablet Take 2 tablets (650 mg total) by mouth every 6 (six) hours as needed for mild pain (or Fever >/= 101).  Marland Kitchen ALPRAZolam (XANAX) 0.5 MG tablet Take 1 tablet (0.5 mg total) by mouth at bedtime as needed for anxiety.  Marland Kitchen amitriptyline (ELAVIL) 50 MG tablet Take 1 tablet (50 mg total) by mouth daily.  Marland Kitchen amLODipine (NORVASC) 2.5 MG tablet Take 1 tablet (2.5 mg total) by mouth daily.  . Cholecalciferol 1.25 MG (50000 UT) TABS Take 1 tablet by mouth daily.  Marland Kitchen gabapentin (NEURONTIN) 300 MG capsule Take 2 capsules (600 mg total) by mouth at bedtime.  Marland Kitchen levothyroxine (SYNTHROID) 75 MCG tablet TAKE 1 TABLET EVERY DAY  Depression screen [facility] 2/9 08/20/2019 07/22/2019 06/17/2019  Decreased Interest 0 0 0  Down, Depressed, Hopeless 0 0 0  PHQ - 2 Score 0 0 0  Altered sleeping 0 - -  Tired, decreased energy 0 - -  Change in appetite 0 - -  Feeling bad or failure about yourself  0 - -  Trouble concentrating 0 - -  Moving slowly or fidgety/restless 0 - -  Suicidal thoughts 0 - -  PHQ-9 Score 0 - -  Difficult doing work/chores Not difficult at all - -     Objective:   Today's Vitals: BP 140/72 (BP Location: Left Arm, Patient Position: Sitting, Cuff Size: Normal)   Pulse 78   Temp (!) 97.3 F (36.3 C) (Temporal)   Resp 18   Ht 5' (1.524 m)   Wt 139 lb (63 kg)   SpO2 93%   BMI 27.15 kg/m   Vitals with BMI 01/11/2020 10/26/2019 08/20/2019  Height 5\' 0"  5\' 0"  5\' 0"   Weight 139 lbs 139 lbs 133 lbs 13 oz  BMI 27.15 27.15 26.13  Systolic 140 135  Diastolic 72 60 68  Pulse 78 90 83     Physical Exam    She looks systemically well.  Blood pressure is acceptable for age.  Weight is stable.  She is alert and orientated without any focal logical signs.   Assessment   1. Essential hypertension, benign   2. Hypothyroidism, unspecified type   3. Vitamin D deficiency disease       Tests ordered Orders Placed This Encounter  Procedures  . COMPLETE METABOLIC PANEL WITH GFR  . VITAMIN D 25 Hydroxy (Vit-D Deficiency, Fractures)  . T4  . T3, free  . TSH     Plan: 1. She will continue with current dose of amlodipine 2.5 mg daily and I am satisfied with the current blood pressure reading. 2. She will continue with levothyroxine at the current dose and we will check thyroid function test today. 3. She will continue with vitamin D3 5000 units daily we will check vitamin D levels. 4. Follow-up in about 6 months for an annual physical exam.   No orders of the defined types were placed in this encounter.   , MD

## 2020-01-12 LAB — T4: T4, Total: 10.5 ug/dL (ref 5.1–11.9)

## 2020-01-12 LAB — COMPLETE METABOLIC PANEL WITH GFR
AG Ratio: 1.8 (calc) (ref 1.0–2.5)
ALT: 18 U/L (ref 6–29)
AST: 22 U/L (ref 10–35)
Albumin: 4.2 g/dL (ref 3.6–5.1)
Alkaline phosphatase (APISO): 144 U/L (ref 37–153)
BUN: 11 mg/dL (ref 7–25)
CO2: 28 mmol/L (ref 20–32)
Calcium: 10.4 mg/dL (ref 8.6–10.4)
Chloride: 104 mmol/L (ref 98–110)
Creat: 0.89 mg/dL (ref 0.60–0.93)
GFR, Est African American: 75 mL/min/{1.73_m2} (ref >=60)
GFR, Est Non African American: 65 mL/min/{1.73_m2} (ref >=60)
Globulin: 2.4 g/dL (calc) (ref 1.9–3.7)
Glucose, Bld: 67 mg/dL (ref 65–99)
Potassium: 4.9 mmol/L (ref 3.5–5.3)
Sodium: 141 mmol/L (ref 135–146)
Total Bilirubin: 0.3 mg/dL (ref 0.2–1.2)
Total Protein: 6.6 g/dL (ref 6.1–8.1)

## 2020-01-12 LAB — T3, FREE: T3, Free: 2.8 pg/mL (ref 2.3–4.2)

## 2020-01-12 LAB — TSH: TSH: 5.78 mIU/L — ABNORMAL HIGH (ref 0.40–4.50)

## 2020-01-12 LAB — VITAMIN D 25 HYDROXY (VIT D DEFICIENCY, FRACTURES): Vit D, 25-Hydroxy: 32 ng/mL (ref 30–100)

## 2020-01-18 NOTE — Progress Notes (Signed)
Pt called to give lab resutls.  Pt said she will look on mychart to see results.

## 2020-01-25 ENCOUNTER — Encounter (INDEPENDENT_AMBULATORY_CARE_PROVIDER_SITE_OTHER): Payer: Self-pay | Admitting: Internal Medicine

## 2020-01-25 ENCOUNTER — Telehealth (INDEPENDENT_AMBULATORY_CARE_PROVIDER_SITE_OTHER): Payer: Medicare HMO | Admitting: Internal Medicine

## 2020-01-25 DIAGNOSIS — R059 Cough, unspecified: Secondary | ICD-10-CM | POA: Diagnosis not present

## 2020-01-25 MED ORDER — AZITHROMYCIN 250 MG PO TABS: ORAL_TABLET | ORAL | 0 refills | Status: DC

## 2020-01-25 NOTE — Progress Notes (Signed)
Metrics: Intervention Frequency ACO  Documented Smoking Status Yearly  Screened one or more times in 24 months  Cessation Counseling or  Active cessation medication Past 24 months  Past 24 months   Guideline developer: UpToDate (See UpToDate for funding source) Date Released: 2014       Wellness Office Visit  Subjective:  Patient ID: [name] [name], female    DOB: [birth year]  Age: 74 y.o. MRN: [id number]  CC: This is an audio telemedicine visit with the permission of the patient who is at home and I am in my office. I used 2 identifiers to identify the patient. Productive cough HPI  This lady gives a 4-day history of productive cough of yellow/green sputum.  She denies any wheezing, fevers, body aches and does not feel dyspneic.  She is fully vaccinated against COVID-19 disease.  She denies any contact with anyone that she knows of that had COVID-19 disease. The cough keeps her awake at night. Past Medical History:  Diagnosis Date  . COPD (chronic obstructive pulmonary disease) (HCC)   . Depression   . Essential hypertension, benign 01/21/2019  . History of pneumonia    2010 on ventalator  . History of recurrent UTIs   . Hypothyroidism   . Insomnia 12/18/2018  . Left [name] DJD   . Osteoporosis   . PTSD (post-traumatic stress disorder)   . Restless leg syndrome    Past Surgical History:  Procedure Laterality Date  . ABDOMINAL HYSTERECTOMY    . [name] ARTHROSCOPY WITH MEDIAL MENISECTOMY Left 08/24/2013   Procedure: LEFT [name] ARTHROSCOPY WITH MEDIAL MENISCECTOMY  ;  Surgeon: Nilda Simmer, MD;  Location: [facility] OR;  Service: Orthopedics;  Laterality: Left;  . PARTIAL [name] ARTHROPLASTY Left 08/24/2013   dr Denton Ar  . PARTIAL [name] ARTHROPLASTY Left 08/24/2013   Procedure: LEFT [name] ARTHROPLASTY UNI COMPARTMENTAL ;  Surgeon: Nilda Simmer, MD;  Location: [facility] OR;  Service: Orthopedics;  Laterality: Left;  Left [name] arthroplasty uni compartment  With left [name] scope medial meniscectomy      . TOTAL VAGINAL HYSTERECTOMY       Family History  Problem Relation Age of Onset  . Stroke Mother   . Heart disease Father   . Heart attack Father   . Hypertension Father     Social History   Social History Narrative   Married 22 years in 2nd marriage.First marriage lasted 20 years.Retired,now a Education officer, environmental.   Social History   Tobacco Use  . Smoking status: Former Smoker    Packs/day: 1.50    Years: 35.00    Pack years: 52.50    Types: Cigarettes    Quit date: 04/10/2003    Years since quitting: 16.8  . Smokeless tobacco: Never Used  Substance Use Topics  . Alcohol use: No    Current Meds  Medication Sig  . acetaminophen (TYLENOL) 325 MG tablet Take 2 tablets (650 mg total) by mouth every 6 (six) hours as needed for mild pain (or Fever >/= 101).  Marland Kitchen ALPRAZolam (XANAX) 0.5 MG tablet Take 1 tablet (0.5 mg total) by mouth at bedtime as needed for anxiety.  Marland Kitchen amitriptyline (ELAVIL) 50 MG tablet Take 1 tablet (50 mg total) by mouth daily.  Marland Kitchen amLODipine (NORVASC) 2.5 MG tablet Take 1 tablet (2.5 mg total) by mouth daily.  . Cholecalciferol 1.25 MG (50000 UT) TABS Take 1 tablet by mouth daily.  Marland Kitchen gabapentin (NEURONTIN) 300 MG capsule Take 2 capsules (600 mg total) by mouth at bedtime.  Marland Kitchen  levothyroxine (SYNTHROID) 75 MCG tablet TAKE 1 TABLET EVERY DAY      Depression screen [facility] 2/9 08/20/2019 07/22/2019 06/17/2019  Decreased Interest 0 0 0  Down, Depressed, Hopeless 0 0 0  PHQ - 2 Score 0 0 0  Altered sleeping 0 - -  Tired, decreased energy 0 - -  Change in appetite 0 - -  Feeling bad or failure about yourself  0 - -  Trouble concentrating 0 - -  Moving slowly or fidgety/restless 0 - -  Suicidal thoughts 0 - -  PHQ-9 Score 0 - -  Difficult doing work/chores Not difficult at all - -     Objective:   Today's Vitals: There were no vitals taken for this visit. Vitals with BMI 01/25/2020 01/11/2020 10/26/2019  Height (No Data) 5\' 0"  5\' 0"   Weight (No Data) 139 lbs 139 lbs   BMI - 27.15 27.15  Systolic (No Data) 140 135  Diastolic (No Data) 72 60  Pulse - 78 90     Physical Exam  Alert and orientated and does not appear to be in respiratory distress on the phone.     Assessment   1. Cough       Tests ordered No orders of the defined types were placed in this encounter.    Plan: 1. I have told the patient this may be a viral illness or an atypical 1.  Doubt that this is COVID-19 disease although possible.  I am going to treat her empirically with Zithromax to see if this will help and suggested Robitussin-DM over-the-counter for relief of symptomatic cough.  If she does not improve, she will let me know. 2. This phone call lasted 5 minutes.   Meds ordered this encounter  Medications  . azithromycin (ZITHROMAX) 250 MG tablet    Sig: Take 2 tablets the first day and then 1 tablet every day for the next 4 days    Dispense:  6 tablet    Refill:  0    Kiing Deakin , MD

## 2020-02-01 ENCOUNTER — Other Ambulatory Visit (INDEPENDENT_AMBULATORY_CARE_PROVIDER_SITE_OTHER): Payer: Self-pay | Admitting: Internal Medicine

## 2020-02-09 ENCOUNTER — Ambulatory Visit (HOSPITAL_COMMUNITY)
Admission: RE | Admit: 2020-02-09 | Discharge: 2020-02-09 | Disposition: A | Discharge status: Home / Self Care | Payer: Medicare HMO | Source: Ambulatory Visit | Attending: Internal Medicine | Admitting: Internal Medicine

## 2020-02-09 ENCOUNTER — Other Ambulatory Visit: Payer: Self-pay

## 2020-02-09 DIAGNOSIS — R928 Other abnormal and inconclusive findings on diagnostic imaging of breast: Secondary | ICD-10-CM | POA: Diagnosis not present

## 2020-02-09 DIAGNOSIS — N6489 Other specified disorders of breast: Secondary | ICD-10-CM | POA: Diagnosis not present

## 2020-02-15 DIAGNOSIS — H5203 Hypermetropia, bilateral: Secondary | ICD-10-CM | POA: Diagnosis not present

## 2020-02-19 ENCOUNTER — Other Ambulatory Visit (INDEPENDENT_AMBULATORY_CARE_PROVIDER_SITE_OTHER): Payer: Self-pay | Admitting: Internal Medicine

## 2020-02-19 DIAGNOSIS — E039 Hypothyroidism, unspecified: Secondary | ICD-10-CM

## 2020-02-29 ENCOUNTER — Other Ambulatory Visit (INDEPENDENT_AMBULATORY_CARE_PROVIDER_SITE_OTHER): Payer: Self-pay | Admitting: Internal Medicine

## 2020-02-29 DIAGNOSIS — I10 Essential (primary) hypertension: Secondary | ICD-10-CM

## 2020-03-14 ENCOUNTER — Telehealth (INDEPENDENT_AMBULATORY_CARE_PROVIDER_SITE_OTHER): Payer: Self-pay

## 2020-03-14 DIAGNOSIS — F5105 Insomnia due to other mental disorder: Secondary | ICD-10-CM

## 2020-03-14 MED ORDER — ALPRAZOLAM 0.5 MG PO TABS: 0.5000 mg | ORAL_TABLET | Freq: Every evening | ORAL | 0 refills | Status: DC | PRN

## 2020-03-14 NOTE — Telephone Encounter (Signed)
[facility] pharmacy sent a refill request for the following medication:  ALPRAZolam (XANAX) 0.5 MG tablet  Last filled 12/18/2018, # 30 with 3 refills  Last OV 01/25/2020, virtual visit  Next OV 07/27/2020

## 2020-03-15 ENCOUNTER — Other Ambulatory Visit: Payer: Self-pay

## 2020-03-15 ENCOUNTER — Ambulatory Visit (INDEPENDENT_AMBULATORY_CARE_PROVIDER_SITE_OTHER): Payer: Medicare HMO

## 2020-03-15 DIAGNOSIS — Z23 Encounter for immunization: Secondary | ICD-10-CM

## 2020-03-15 NOTE — Progress Notes (Signed)
Pt did a walk-in for a High dose flu injection today. Pt give Fluad IM to the Rt arm/Deltoid. Pt tolerated well. No complaints.

## 2020-03-30 ENCOUNTER — Other Ambulatory Visit (INDEPENDENT_AMBULATORY_CARE_PROVIDER_SITE_OTHER): Payer: Self-pay

## 2020-03-30 ENCOUNTER — Telehealth (INDEPENDENT_AMBULATORY_CARE_PROVIDER_SITE_OTHER): Payer: Self-pay

## 2020-03-30 ENCOUNTER — Other Ambulatory Visit (INDEPENDENT_AMBULATORY_CARE_PROVIDER_SITE_OTHER): Payer: Self-pay | Admitting: Internal Medicine

## 2020-03-30 MED ORDER — ALBUTEROL SULFATE (2.5 MG/3ML) 0.083% IN NEBU: 2.5000 mg | INHALATION_SOLUTION | Freq: Four times a day (QID) | RESPIRATORY_TRACT | 1 refills | Status: DC | PRN

## 2020-03-30 NOTE — Telephone Encounter (Signed)
Patient called and left a detailed voice message that she would like the albuterol nebulizer solution sent to her pharmacy so she can have on hand if she needs it.  I do not see this in her medication list?

## 2020-03-30 NOTE — Telephone Encounter (Signed)
Called and let patient know that this prescription has been sent to [facility] pharmacy. Patient verbalized an understanding.

## 2020-03-30 NOTE — Telephone Encounter (Signed)
She has had it before,long time ago.I have sent Rx to [facility].

## 2020-04-09 ENCOUNTER — Other Ambulatory Visit (INDEPENDENT_AMBULATORY_CARE_PROVIDER_SITE_OTHER): Payer: Self-pay | Admitting: Internal Medicine

## 2020-04-27 ENCOUNTER — Other Ambulatory Visit (INDEPENDENT_AMBULATORY_CARE_PROVIDER_SITE_OTHER): Payer: Self-pay | Admitting: Internal Medicine

## 2020-05-06 ENCOUNTER — Other Ambulatory Visit (INDEPENDENT_AMBULATORY_CARE_PROVIDER_SITE_OTHER): Payer: Self-pay | Admitting: Internal Medicine

## 2020-05-06 DIAGNOSIS — E039 Hypothyroidism, unspecified: Secondary | ICD-10-CM

## 2020-05-16 ENCOUNTER — Other Ambulatory Visit (INDEPENDENT_AMBULATORY_CARE_PROVIDER_SITE_OTHER): Payer: Self-pay | Admitting: Internal Medicine

## 2020-05-16 DIAGNOSIS — I10 Essential (primary) hypertension: Secondary | ICD-10-CM

## 2020-06-13 ENCOUNTER — Ambulatory Visit (INDEPENDENT_AMBULATORY_CARE_PROVIDER_SITE_OTHER): Payer: Medicare HMO | Admitting: Internal Medicine

## 2020-06-13 ENCOUNTER — Encounter (INDEPENDENT_AMBULATORY_CARE_PROVIDER_SITE_OTHER): Payer: Self-pay | Admitting: Internal Medicine

## 2020-06-13 ENCOUNTER — Other Ambulatory Visit: Payer: Self-pay

## 2020-06-13 VITALS — BP 126/72 | HR 81 | Temp 97.3°F | Ht 60.0 in | Wt 142.2 lb

## 2020-06-13 DIAGNOSIS — N3000 Acute cystitis without hematuria: Secondary | ICD-10-CM

## 2020-06-13 MED ORDER — CIPROFLOXACIN HCL 500 MG PO TABS: 500.0000 mg | ORAL_TABLET | Freq: Two times a day (BID) | ORAL | 0 refills | Status: DC

## 2020-06-13 NOTE — Progress Notes (Signed)
Metrics: Intervention Frequency ACO  Documented Smoking Status Yearly  Screened one or more times in 24 months  Cessation Counseling or  Active cessation medication Past 24 months  Past 24 months   Guideline developer: UpToDate (See UpToDate for funding source) Date Released: 2014       Wellness Office Visit  Subjective:  Patient ID: [name] [name], female    DOB: Aug 07, 1945  Age: 75 y.o. MRN: [id number]  CC: Urinary tract infection HPI  Patient describes dysuria, increased frequency of micturition, pelvic pressure and discomfort for the last 3 to 4 days.  She denies any nausea or vomiting or fever.  She has had urine infections before. Past Medical History:  Diagnosis Date  . COPD (chronic obstructive pulmonary disease) (HCC)   . Depression   . Essential hypertension, benign 01/21/2019  . History of pneumonia    2010 on ventalator  . History of recurrent UTIs   . Hypothyroidism   . Insomnia 12/18/2018  . Left knee DJD   . Osteoporosis   . PTSD (post-traumatic stress disorder)   . Restless leg syndrome    Past Surgical History:  Procedure Laterality Date  . ABDOMINAL HYSTERECTOMY    . KNEE ARTHROSCOPY WITH MEDIAL MENISECTOMY Left 08/24/2013   Procedure: LEFT KNEE ARTHROSCOPY WITH MEDIAL MENISCECTOMY  ;  Surgeon: Nilda Simmer, MD;  Location: [facility] OR;  Service: Orthopedics;  Laterality: Left;  . PARTIAL KNEE ARTHROPLASTY Left 08/24/2013   dr Denton Ar  . PARTIAL KNEE ARTHROPLASTY Left 08/24/2013   Procedure: LEFT KNEE ARTHROPLASTY UNI COMPARTMENTAL ;  Surgeon: Nilda Simmer, MD;  Location: [facility] OR;  Service: Orthopedics;  Laterality: Left;  Left knee arthroplasty uni compartment  With left knee scope medial meniscectomy    . TOTAL VAGINAL HYSTERECTOMY       Family History  Problem Relation Age of Onset  . Stroke Mother   . Heart disease Father   . Heart attack Father   . Hypertension Father     Social History   Social History Narrative   Married 22 years in 2nd  marriage.First marriage lasted 20 years.Retired,now a Education officer, environmental.   Social History   Tobacco Use  . Smoking status: Former Smoker    Packs/day: 1.50    Years: 35.00    Pack years: 52.50    Types: Cigarettes    Quit date: 04/10/2003    Years since quitting: 17.1  . Smokeless tobacco: Never Used  Substance Use Topics  . Alcohol use: No    Current Meds  Medication Sig  . acetaminophen (TYLENOL) 325 MG tablet Take 2 tablets (650 mg total) by mouth every 6 (six) hours as needed for mild pain (or Fever >/= 101).  Marland Kitchen ALPRAZolam (XANAX) 0.5 MG tablet Take 1 tablet (0.5 mg total) by mouth at bedtime as needed for anxiety.  Marland Kitchen amitriptyline (ELAVIL) 50 MG tablet TAKE 1 TABLET EVERY DAY  . amLODipine (NORVASC) 2.5 MG tablet TAKE 1 TABLET EVERY DAY  . Cholecalciferol 1.25 MG (50000 UT) TABS Take 1 tablet by mouth daily.  . ciprofloxacin (CIPRO) 500 MG tablet Take 1 tablet (500 mg total) by mouth 2 (two) times daily.  Marland Kitchen gabapentin (NEURONTIN) 300 MG capsule Take 2 capsules (600 mg total) by mouth at bedtime.  Marland Kitchen levothyroxine (SYNTHROID) 75 MCG tablet TAKE 1 TABLET EVERY DAY     Flowsheet Row Office Visit from 06/13/2020 in [facility]  PHQ-9 Total Score 2      Objective:   Today's Vitals:  BP 126/72   Pulse 81   Temp (!) 97.3 F (36.3 C) (Temporal)   Ht 5' (1.524 m)   Wt 142 lb 3.2 oz (64.5 kg)   SpO2 99%   BMI 27.77 kg/m  Vitals with BMI 06/13/2020 01/25/2020 01/11/2020  Height 5\' 0"  (No Data) 5\' 0"   Weight 142 lbs 3 oz (No Data) 139 lbs  BMI 27.77 - 27.15  Systolic 126 (No Data) 140  Diastolic 72 (No Data) 72  Pulse 81 - 78     Physical Exam  She looks systemically well.  Afebrile.     Assessment   1. Acute cystitis without hematuria       Tests ordered Orders Placed This Encounter  Procedures  . Urinalysis w microscopic + reflex cultur     Plan: 1. We will send the urine for culture and treat her empirically with ciprofloxacin until we get sensitivities  back.  She is already drinking plenty of water.  If she does not get better, she will let me know.   Meds ordered this encounter  Medications  . ciprofloxacin (CIPRO) 500 MG tablet    Sig: Take 1 tablet (500 mg total) by mouth 2 (two) times daily.    Dispense:  10 tablet    Refill:  0    Nimish , MD

## 2020-06-16 LAB — URINALYSIS W MICROSCOPIC + REFLEX CULTURE
Bacteria, UA: NONE SEEN /HPF
Bilirubin Urine: NEGATIVE
Glucose, UA: NEGATIVE
Hyaline Cast: NONE SEEN /LPF
Ketones, ur: NEGATIVE
Nitrites, Initial: NEGATIVE
Protein, ur: NEGATIVE
RBC / HPF: NONE SEEN /HPF (ref 0–2)
Specific Gravity, Urine: 1.005 (ref 1.001–1.03)
pH: 6.5 (ref 5.0–8.0)

## 2020-06-16 LAB — URINE CULTURE

## 2020-06-16 LAB — CULTURE INDICATED

## 2020-06-17 ENCOUNTER — Other Ambulatory Visit (INDEPENDENT_AMBULATORY_CARE_PROVIDER_SITE_OTHER): Payer: Self-pay | Admitting: Internal Medicine

## 2020-06-17 DIAGNOSIS — G2581 Restless legs syndrome: Secondary | ICD-10-CM

## 2020-06-20 ENCOUNTER — Other Ambulatory Visit (INDEPENDENT_AMBULATORY_CARE_PROVIDER_SITE_OTHER): Payer: Self-pay | Admitting: Internal Medicine

## 2020-06-20 ENCOUNTER — Telehealth (INDEPENDENT_AMBULATORY_CARE_PROVIDER_SITE_OTHER): Payer: Self-pay

## 2020-06-20 DIAGNOSIS — F99 Mental disorder, not otherwise specified: Secondary | ICD-10-CM

## 2020-06-20 DIAGNOSIS — F5105 Insomnia due to other mental disorder: Secondary | ICD-10-CM

## 2020-06-20 DIAGNOSIS — G2581 Restless legs syndrome: Secondary | ICD-10-CM

## 2020-06-20 MED ORDER — GABAPENTIN 300 MG PO CAPS: 600.0000 mg | ORAL_CAPSULE | Freq: Every day | ORAL | 0 refills | Status: DC

## 2020-06-20 MED ORDER — ALPRAZOLAM 0.5 MG PO TABS
0.5000 mg | ORAL_TABLET | Freq: Every evening | ORAL | 3 refills | Status: DC | PRN
Start: 2020-06-20 — End: 2021-01-23

## 2020-06-20 NOTE — Telephone Encounter (Signed)
Patient called and left a detailed voice message to let us know that [facility] will not be able to get her Gabapentin 300mg  to her for 10 days and patient states that she needs a small supply sent to [facility] in [place] until her 90 days from [place] comes in.  Are you able to send in a short term supply for her to [facility] in [place]?

## 2020-06-20 NOTE — Telephone Encounter (Signed)
Called patient and gave her the message that her medication has been sent. Patient also stated that she needs a refill of the following medication sent to [facility]:  ALPRAZolam Prudy Feeler) 0.5 MG tablet  Last filled 03/14/2020, # 30 with 0 refills  [facility]

## 2020-06-20 NOTE — Telephone Encounter (Signed)
Okay, I have sent a month supply to [facility] in [place].  Thanks.

## 2020-06-21 ENCOUNTER — Other Ambulatory Visit (INDEPENDENT_AMBULATORY_CARE_PROVIDER_SITE_OTHER): Payer: Self-pay | Admitting: Internal Medicine

## 2020-07-11 ENCOUNTER — Other Ambulatory Visit (INDEPENDENT_AMBULATORY_CARE_PROVIDER_SITE_OTHER): Payer: Self-pay | Admitting: Internal Medicine

## 2020-07-11 DIAGNOSIS — E039 Hypothyroidism, unspecified: Secondary | ICD-10-CM

## 2020-07-20 ENCOUNTER — Other Ambulatory Visit (INDEPENDENT_AMBULATORY_CARE_PROVIDER_SITE_OTHER): Payer: Self-pay | Admitting: Internal Medicine

## 2020-07-20 DIAGNOSIS — I10 Essential (primary) hypertension: Secondary | ICD-10-CM

## 2020-07-27 ENCOUNTER — Encounter (INDEPENDENT_AMBULATORY_CARE_PROVIDER_SITE_OTHER): Payer: Medicare HMO | Admitting: Internal Medicine

## 2020-08-12 NOTE — Progress Notes (Deleted)
Subjective:   [name] [name] is a 75 y.o. female who presents for an Initial Medicare Annual Wellness Visit.  Review of Systems    N/A        Objective:    There were no vitals filed for this visit. There is no height or weight on file to calculate BMI.  Advanced Directives 07/20/2016 04/12/2016 08/25/2013 08/14/2013  Does Patient Have a Medical Advance Directive? No No Patient does not have advance directive;Patient would not like information Patient does not have advance directive;Patient would like information  Would patient like information on creating a medical advance directive? No - Patient declined No - Patient declined - Advance directive packet given  Pre-existing out of facility DNR order (yellow form or pink MOST form) - - No -    Current Medications (verified) Outpatient Encounter Medications as of 08/15/2020  Medication Sig  . acetaminophen (TYLENOL) 325 MG tablet Take 2 tablets (650 mg total) by mouth every 6 (six) hours as needed for mild pain (or Fever >/= 101).  Marland Kitchen albuterol (PROVENTIL) (2.5 MG/3ML) 0.083% nebulizer solution INHALE THE CONTENTS OF 1 VIAL VIA NEBULIZER EVERY 6 HOURS AS NEEDED FOR WHEEZING OR SHORTNESS OF BREATH  . ALPRAZolam (XANAX) 0.5 MG tablet Take 1 tablet (0.5 mg total) by mouth at bedtime as needed for anxiety.  Marland Kitchen amitriptyline (ELAVIL) 50 MG tablet TAKE 1 TABLET EVERY DAY  . amLODipine (NORVASC) 2.5 MG tablet TAKE 1 TABLET EVERY DAY  . Cholecalciferol 1.25 MG (50000 UT) TABS Take 1 tablet by mouth daily.  . ciprofloxacin (CIPRO) 500 MG tablet Take 1 tablet (500 mg total) by mouth 2 (two) times daily.  Marland Kitchen gabapentin (NEURONTIN) 300 MG capsule Take 2 capsules (600 mg total) by mouth at bedtime.  Marland Kitchen levothyroxine (SYNTHROID) 75 MCG tablet TAKE 1 TABLET EVERY DAY   No facility-administered encounter medications on file as of 08/15/2020.    Allergies (verified) Codeine   History: Past Medical History:  Diagnosis Date  . COPD (chronic obstructive  pulmonary disease) (HCC)   . Depression   . Essential hypertension, benign 01/21/2019  . History of pneumonia    2010 on ventalator  . History of recurrent UTIs   . Hypothyroidism   . Insomnia 12/18/2018  . Left knee DJD   . Osteoporosis   . PTSD (post-traumatic stress disorder)   . Restless leg syndrome    Past Surgical History:  Procedure Laterality Date  . ABDOMINAL HYSTERECTOMY    . KNEE ARTHROSCOPY WITH MEDIAL MENISECTOMY Left 08/24/2013   Procedure: LEFT KNEE ARTHROSCOPY WITH MEDIAL MENISCECTOMY  ;  Surgeon: Nilda Simmer, MD;  [facility];  Service: Orthopedics;  Laterality: Left;  . PARTIAL KNEE ARTHROPLASTY Left 08/24/2013   dr Denton Ar  . PARTIAL KNEE ARTHROPLASTY Left 08/24/2013   Procedure: LEFT KNEE ARTHROPLASTY UNI COMPARTMENTAL ;  Surgeon: Nilda Simmer, MD;  Location: [facility];  Service: Orthopedics;  Laterality: Left;  Left knee arthroplasty uni compartment  With left knee scope medial meniscectomy    . TOTAL VAGINAL HYSTERECTOMY     Family History  Problem Relation Age of Onset  . Stroke Mother   . Heart disease Father   . Heart attack Father   . Hypertension Father    Social History   Socioeconomic History  . Marital status: Married    Spouse name: Not on file  . Number of children: Not on file  . Years of education: Not on file  . Highest education level: High  school graduate  Occupational History  . Occupation: Retired  Tobacco Use  . Smoking status: Former Smoker    Packs/day: 1.50    Years: 35.00    Pack years: 52.50    Types: Cigarettes    Quit date: 04/10/2003    Years since quitting: 17.3  . Smokeless tobacco: Never Used  Vaping Use  . Vaping Use: Never used  Substance and Sexual Activity  . Alcohol use: No  . Drug use: No  . Sexual activity: Not on file  Other Topics Concern  . Not on file  Social History Narrative   Married 22 years in 2nd marriage.First marriage lasted 20 years.Retired,now a Education officer, environmental.   Social Determinants of  Health   Financial Resource Strain: Not on file  Food Insecurity: Not on file  Transportation Needs: Not on file  Physical Activity: Not on file  Stress: Not on file  Social Connections: Not on file    Tobacco Counseling Counseling given: Not Answered   Clinical Intake:                 Diabetic?No          Activities of Daily Living No flowsheet data found.  Patient Care Team: Wilson Singer, MD as PCP - General (Internal Medicine)  Indicate any recent Medical Services you may have received from other than [facility] providers in the past year (date may be approximate).     Assessment:   This is a routine wellness examination for Hafsa.  Hearing/Vision screen No exam data present  Dietary issues and exercise activities discussed:    Goals Addressed   None    Depression Screen PHQ 2/9 Scores 06/13/2020 08/20/2019 07/22/2019 06/17/2019  PHQ - 2 Score 2 0 0 0  PHQ- 9 Score 2 0 - -  Exception Documentation - - Medical reason Medical reason    Fall Risk Fall Risk  06/13/2020 08/20/2019 07/22/2019 06/17/2019  Falls in the past year? 1 0 0 0  Number falls in past yr: 1 0 0 0  Injury with Fall? 0 0 0 0  Risk for fall due to : - - No Fall Risks No Fall Risks  Follow up - - Falls evaluation completed Falls evaluation completed    FALL RISK PREVENTION PERTAINING TO THE HOME:  Any stairs in or around the home? {YES/NO:21197} If so, are there any without handrails? No  Home free of loose throw rugs in walkways, pet beds, electrical cords, etc? Yes  Adequate lighting in your home to reduce risk of falls? Yes   ASSISTIVE DEVICES UTILIZED TO PREVENT FALLS:  Life alert? {YES/NO:21197} Use of a cane, walker or w/c? {YES/NO:21197} Grab bars in the bathroom? {YES/NO:21197} Shower chair or bench in shower? {YES/NO:21197} Elevated toilet seat or a handicapped toilet? {YES/NO:21197}  TIMED UP AND GO:  Was the test performed? Yes .  Length of time to ambulate 10  feet: *** sec.   {Appearance of VHQI:6962952}  Cognitive Function:        Immunizations Immunization History  Administered Date(s) Administered  . Fluad Quad(high Dose 65+) 01/21/2019, 03/15/2020  . PFIZER(Purple Top)SARS-COV-2 Vaccination 05/25/2019, 06/22/2019  . Pneumococcal Polysaccharide-23 01/21/2019  . Tdap 07/22/2019  . Zoster 04/09/2013    TDAP status: Up to date  Flu Vaccine status: Up to date  {Pneumococcal vaccine status:2101807}  Covid-19 vaccine status: Completed vaccines  Qualifies for Shingles Vaccine? Yes   Zostavax completed Yes   Shingrix Completed?: No.    Education has been  provided regarding the importance of this vaccine. Patient has been advised to call insurance company to determine out of pocket expense if they have not yet received this vaccine. Advised may also receive vaccine at local pharmacy or Health Dept. Verbalized acceptance and understanding.  Screening Tests Health Maintenance  Topic Date Due  . COLONOSCOPY (Pts 45-53yrs Insurance coverage will need to be confirmed)  12/18/2014  . COVID-19 Vaccine (3 - Booster for Pfizer series) 12/23/2019  . PNA vac Low Risk Adult (2 of 2 - PCV13) 01/21/2020  . INFLUENZA VACCINE  11/07/2020  . MAMMOGRAM  06/24/2021  . TETANUS/TDAP  07/21/2029  . DEXA SCAN  Completed  . Hepatitis C Screening  Completed  . HPV VACCINES  Aged Out    Health Maintenance  Health Maintenance Due  Topic Date Due  . COLONOSCOPY (Pts 45-53yrs Insurance coverage will need to be confirmed)  12/18/2014  . COVID-19 Vaccine (3 - Booster for Pfizer series) 12/23/2019  . PNA vac Low Risk Adult (2 of 2 - PCV13) 01/21/2020    {Colorectal cancer screening:2101809}  Mammogram status: Ordered 08/15/2020. Pt provided with contact info and advised to call to schedule appt.   Bone Density status: Completed 08/04/2019. Results reflect: Bone density results: OSTEOPOROSIS. Repeat every 2 years.  Lung Cancer Screening: (Low Dose CT  Chest recommended if Age 13-80 years, 30 pack-year currently smoking OR have quit w/in 15years.) does not qualify.   Lung Cancer Screening Referral: N/A   Additional Screening:  Hepatitis C Screening: does qualify; Completed 07/23/2019   Vision Screening: Recommended annual ophthalmology exams for early detection of glaucoma and other disorders of the eye. Is the patient up to date with their annual eye exam?  {YES/NO:21197} Who is the provider or what is the name of the office in which the patient attends annual eye exams? *** If pt is not established with a provider, would they like to be referred to a provider to establish care? {YES/NO:21197}.   Dental Screening: Recommended annual dental exams for proper oral hygiene  Community Resource Referral / Chronic Care Management: CRR required this visit?  No   CCM required this visit?  No      Plan:     I have personally reviewed and noted the following in the patient's chart:   . Medical and social history . Use of alcohol, tobacco or illicit drugs  . Current medications and supplements including opioid prescriptions. Patient is not currently taking opioid prescriptions. . Functional ability and status . Nutritional status . Physical activity . Advanced directives . List of other physicians . Hospitalizations, surgeries, and ER visits in previous 12 months . Vitals . Screenings to include cognitive, depression, and falls . Referrals and appointments  In addition, I have reviewed and discussed with patient certain preventive protocols, quality metrics, and best practice recommendations. A written personalized care plan for preventive services as well as general preventive health recommendations were provided to patient.     Theodora Blow, LPN   06/11/3612   Nurse Notes: None

## 2020-08-15 ENCOUNTER — Ambulatory Visit (INDEPENDENT_AMBULATORY_CARE_PROVIDER_SITE_OTHER): Payer: Medicare HMO

## 2020-08-15 DIAGNOSIS — Z0000 Encounter for general adult medical examination without abnormal findings: Secondary | ICD-10-CM

## 2020-08-25 ENCOUNTER — Encounter (INDEPENDENT_AMBULATORY_CARE_PROVIDER_SITE_OTHER): Payer: Medicare HMO | Admitting: Nurse Practitioner

## 2020-08-26 NOTE — Progress Notes (Addendum)
Subjective:   Chestine SporeRachel W Walthers is a 75 y.o. female who presents for an Initial Medicare Annual Wellness Visit.  Review of Systems    N/A  Cardiac Risk Factors include: advanced age (>1255men, 1>65 women);hypertension     Objective:    Today's Vitals   08/29/20 1046 08/29/20 1047  BP: 132/64   Pulse: 84   Temp: 97.6 F (36.4 C)   TempSrc: Temporal   SpO2: 95%   Weight: 141 lb (64 kg)   Height: 5' (1.524 m)   PainSc:  4    Body mass index is 27.54 kg/m.  Advanced Directives 08/29/2020 07/20/2016 04/12/2016 08/25/2013 08/14/2013  Does Patient Have a Medical Advance Directive? No No No Patient does not have advance directive;Patient would not like information Patient does not have advance directive;Patient would like information  Would patient like information on creating a medical advance directive? No - Patient declined No - Patient declined No - Patient declined - Advance directive packet given  Pre-existing out of facility DNR order (yellow form or pink MOST form) - - - No -    Current Medications (verified) Outpatient Encounter Medications as of 08/29/2020  Medication Sig  . acetaminophen (TYLENOL) 325 MG tablet Take 2 tablets (650 mg total) by mouth every 6 (six) hours as needed for mild pain (or Fever >/= 101).  Marland Kitchen. albuterol (PROVENTIL) (2.5 MG/3ML) 0.083% nebulizer solution INHALE THE CONTENTS OF 1 VIAL VIA NEBULIZER EVERY 6 HOURS AS NEEDED FOR WHEEZING OR SHORTNESS OF BREATH  . ALPRAZolam (XANAX) 0.5 MG tablet Take 1 tablet (0.5 mg total) by mouth at bedtime as needed for anxiety.  Marland Kitchen. amitriptyline (ELAVIL) 50 MG tablet TAKE 1 TABLET EVERY DAY  . amLODipine (NORVASC) 2.5 MG tablet TAKE 1 TABLET EVERY DAY  . Cholecalciferol 1.25 MG (50000 UT) TABS Take 1 tablet by mouth daily.  Marland Kitchen. gabapentin (NEURONTIN) 300 MG capsule Take 2 capsules (600 mg total) by mouth at bedtime.  Marland Kitchen. levothyroxine (SYNTHROID) 75 MCG tablet TAKE 1 TABLET EVERY DAY  . [DISCONTINUED] ciprofloxacin (CIPRO) 500 MG  tablet Take 1 tablet (500 mg total) by mouth 2 (two) times daily.   No facility-administered encounter medications on file as of 08/29/2020.    Allergies (verified) Codeine   History: Past Medical History:  Diagnosis Date  . COPD (chronic obstructive pulmonary disease) (HCC)   . Depression   . Essential hypertension, benign 01/21/2019  . History of pneumonia    2010 on ventalator  . History of recurrent UTIs   . Hypothyroidism   . Insomnia 12/18/2018  . Left knee DJD   . Osteoporosis   . PTSD (post-traumatic stress disorder)   . Restless leg syndrome    Past Surgical History:  Procedure Laterality Date  . ABDOMINAL HYSTERECTOMY    . KNEE ARTHROSCOPY WITH MEDIAL MENISECTOMY Left 08/24/2013   Procedure: LEFT KNEE ARTHROSCOPY WITH MEDIAL MENISCECTOMY  ;  Surgeon: Nilda Simmerobert A Wainer, MD;  Location: [facility] OR;  Service: Orthopedics;  Laterality: Left;  . PARTIAL KNEE ARTHROPLASTY Left 08/24/2013   dr Denton Arwanier  . PARTIAL KNEE ARTHROPLASTY Left 08/24/2013   Procedure: LEFT KNEE ARTHROPLASTY UNI COMPARTMENTAL ;  Surgeon: Nilda Simmerobert A Wainer, MD;  Location: [facility] OR;  Service: Orthopedics;  Laterality: Left;  Left knee arthroplasty uni compartment  With left knee scope medial meniscectomy    . TOTAL VAGINAL HYSTERECTOMY     Family History  Problem Relation Age of Onset  . Stroke Mother   . Heart disease Father   .  Heart attack Father   . Hypertension Father    Social History   Socioeconomic History  . Marital status: Married    Spouse name: Not on file  . Number of children: Not on file  . Years of education: Not on file  . Highest education level: High school graduate  Occupational History  . Occupation: Retired  Tobacco Use  . Smoking status: Former Smoker    Packs/day: 1.50    Years: 35.00    Pack years: 52.50    Types: Cigarettes    Quit date: 04/10/2003    Years since quitting: 17.4  . Smokeless tobacco: Never Used  Vaping Use  . Vaping Use: Never used  Substance and Sexual  Activity  . Alcohol use: No  . Drug use: No  . Sexual activity: Not on file  Other Topics Concern  . Not on file  Social History Narrative   Married 22 years in 2nd marriage.First marriage lasted 20 years.Retired,now a Education officer, environmental.   Social Determinants of Health   Financial Resource Strain: Low Risk   . Difficulty of Paying Living Expenses: Not hard at all  Food Insecurity: No Food Insecurity  . Worried About Programme researcher, broadcasting/film/video in the Last Year: Never true  . Ran Out of Food in the Last Year: Never true  Transportation Needs: No Transportation Needs  . Lack of Transportation (Medical): No  . Lack of Transportation (Non-Medical): No  Physical Activity: Inactive  . Days of Exercise per Week: 0 days  . Minutes of Exercise per Session: 0 min  Stress: No Stress Concern Present  . Feeling of Stress : Not at all  Social Connections: Moderately Isolated  . Frequency of Communication with Friends and Family: More than three times a week  . Frequency of Social Gatherings with Friends and Family: More than three times a week  . Attends Religious Services: Never  . Active Member of Clubs or Organizations: No  . Attends Banker Meetings: Never  . Marital Status: Married    Tobacco Counseling Counseling given: Not Answered   Clinical Intake:  Pre-visit preparation completed: Yes  Pain : 0-10 Pain Score: 4  Pain Type: Chronic pain Pain Location: Shoulder Pain Orientation: Left Pain Descriptors / Indicators: Aching Pain Onset: More than a month ago Pain Frequency: Constant     Diabetes: No  How often do you need to have someone help you when you read instructions, pamphlets, or other written materials from your doctor or pharmacy?: 1 - Never  Diabetic?No  Interpreter Needed?: No  Information entered by :: SCrews,LPN   Activities of Daily Living In your present state of health, do you have any difficulty performing the following activities: 08/29/2020   Hearing? N  Vision? N  Difficulty concentrating or making decisions? N  Walking or climbing stairs? N  Dressing or bathing? N  Doing errands, shopping? N  Preparing Food and eating ? N  Using the Toilet? N  In the past six months, have you accidently leaked urine? Y  Do you have problems with loss of bowel control? Y  Managing your Medications? N  Managing your Finances? N  Housekeeping or managing your Housekeeping? N  Some recent data might be hidden    Patient Care Team: Wilson Singer, MD as PCP - General (Internal Medicine)  Indicate any recent Medical Services you may have received from other than [facility] providers in the past year (date may be approximate).     Assessment:  This is a routine wellness examination for Margretta.  Hearing/Vision screen  Hearing Screening   125Hz  250Hz  500Hz  1000Hz  2000Hz  3000Hz  4000Hz  6000Hz  8000Hz   Right ear:           Left ear:           Vision Screening Comments: Patient stated gets eyes examined once per year. Patient currently wears glasses   Dietary issues and exercise activities discussed: Current Exercise Habits: The patient does not participate in regular exercise at present, Exercise limited by: orthopedic condition(s)  Goals Addressed            This Visit's Progress   . Weight (lb) < 145 lb (65.8 kg)   141 lb (64 kg)     Depression Screen PHQ 2/9 Scores 08/29/2020 06/13/2020 08/20/2019 07/22/2019 06/17/2019  PHQ - 2 Score 0 2 0 0 0  PHQ- 9 Score - 2 0 - -  Exception Documentation - - - Medical reason Medical reason    Fall Risk Fall Risk  08/29/2020 06/13/2020 08/20/2019 07/22/2019 06/17/2019  Falls in the past year? 0 1 0 0 0  Number falls in past yr: 0 1 0 0 0  Injury with Fall? 0 0 0 0 0  Risk for fall due to : No Fall Risks - - No Fall Risks No Fall Risks  Follow up Falls evaluation completed;Falls prevention discussed - - Falls evaluation completed Falls evaluation completed    FALL RISK PREVENTION PERTAINING TO THE  HOME:  Any stairs in or around the home? No  If so, are there any without handrails? No  Home free of loose throw rugs in walkways, pet beds, electrical cords, etc? Yes  Adequate lighting in your home to reduce risk of falls? Yes   ASSISTIVE DEVICES UTILIZED TO PREVENT FALLS:  Life alert? No  Use of a cane, walker or w/c? No  Grab bars in the bathroom? Yes  Shower chair or bench in shower? No  Elevated toilet seat or a handicapped toilet? No   TIMED UP AND GO:  Was the test performed? Yes .  Length of time to ambulate 10 feet: 3 sec.   Gait steady and fast without use of assistive device  Cognitive Function:   Normal cognitive status assessed by direct observation by this Nurse Health Advisor. No abnormalities found.       Immunizations Immunization History  Administered Date(s) Administered  . Fluad Quad(high Dose 65+) 01/21/2019, 03/15/2020  . PFIZER(Purple Top)SARS-COV-2 Vaccination 05/25/2019, 06/22/2019  . Pneumococcal Conjugate-13 08/29/2020  . Pneumococcal Polysaccharide-23 01/21/2019  . Tdap 07/22/2019  . Zoster 04/09/2013    TDAP status: Up to date  Flu Vaccine status: Up to date  Pneumococcal vaccine status: Completed during today's visit.  Covid-19 vaccine status: Completed vaccines  Qualifies for Shingles Vaccine? Yes   Zostavax completed Yes   Shingrix Completed?: No.    Education has been provided regarding the importance of this vaccine. Patient has been advised to call insurance company to determine out of pocket expense if they have not yet received this vaccine. Advised may also receive vaccine at local pharmacy or Health Dept. Verbalized acceptance and understanding.  Screening Tests Health Maintenance  Topic Date Due  . COLONOSCOPY (Pts 45-84yrs Insurance coverage will need to be confirmed)  12/18/2014  . COVID-19 Vaccine (3 - Booster for Pfizer series) 11/22/2019  . MAMMOGRAM  06/24/2020  . INFLUENZA VACCINE  11/07/2020  . TETANUS/TDAP   07/21/2029  . DEXA SCAN  Completed  .  Hepatitis C Screening  Completed  . PNA vac Low Risk Adult  Completed  . HPV VACCINES  Aged Out    Health Maintenance  Health Maintenance Due  Topic Date Due  . COLONOSCOPY (Pts 45-18yrs Insurance coverage will need to be confirmed)  12/18/2014  . COVID-19 Vaccine (3 - Booster for Pfizer series) 11/22/2019  . MAMMOGRAM  06/24/2020     Colorectal cancer screening: Referral to GI placed 08/29/2020. Pt aware the office will call re: appt.  Mammogram status: Ordered 08/29/2020. Pt provided with contact info and advised to call to schedule appt.   Bone Density status: Completed 08/04/2019. Results reflect: Bone density results: OSTEOPOROSIS. Repeat every 2 years.  Lung Cancer Screening: (Low Dose CT Chest recommended if Age 64-80 years, 30 pack-year currently smoking OR have quit w/in 15years.) does not qualify.   Lung Cancer Screening Referral: N/A   Additional Screening:  Hepatitis C Screening: does qualify; Completed 07/23/2019  Vision Screening: Recommended annual ophthalmology exams for early detection of glaucoma and other disorders of the eye. Is the patient up to date with their annual eye exam?  Yes  Who is the provider or what is the name of the office in which the patient attends annual eye exams? [facility] in [place]. Unable to remember eye doctors name  If pt is not established with a provider, would they like to be referred to a provider to establish care? No .   Dental Screening: Recommended annual dental exams for proper oral hygiene  Community Resource Referral / Chronic Care Management: CRR required this visit?  No   CCM required this visit?  No      Plan:     I have personally reviewed and noted the following in the patient's chart:   . Medical and social history . Use of alcohol, tobacco or illicit drugs  . Current medications and supplements including opioid prescriptions. Patient is not currently taking  opioid prescriptions. . Functional ability and status . Nutritional status . Physical activity . Advanced directives . List of other physicians . Hospitalizations, surgeries, and ER visits in previous 12 months . Vitals . Screenings to include cognitive, depression, and falls . Referrals and appointments  In addition, I have reviewed and discussed with patient certain preventive protocols, quality metrics, and best practice recommendations. A written personalized care plan for preventive services as well as general preventive health recommendations were provided to patient.     Theodora Blow, LPN   [id number]   Nurse Notes: None

## 2020-08-29 ENCOUNTER — Other Ambulatory Visit (INDEPENDENT_AMBULATORY_CARE_PROVIDER_SITE_OTHER): Payer: Self-pay | Admitting: Internal Medicine

## 2020-08-29 ENCOUNTER — Encounter (INDEPENDENT_AMBULATORY_CARE_PROVIDER_SITE_OTHER): Payer: Self-pay

## 2020-08-29 ENCOUNTER — Ambulatory Visit (INDEPENDENT_AMBULATORY_CARE_PROVIDER_SITE_OTHER): Payer: Medicare HMO

## 2020-08-29 ENCOUNTER — Other Ambulatory Visit: Payer: Self-pay

## 2020-08-29 VITALS — BP 132/64 | HR 84 | Temp 97.6°F | Ht 60.0 in | Wt 141.0 lb

## 2020-08-29 DIAGNOSIS — Z1231 Encounter for screening mammogram for malignant neoplasm of breast: Secondary | ICD-10-CM

## 2020-08-29 DIAGNOSIS — Z23 Encounter for immunization: Secondary | ICD-10-CM | POA: Diagnosis not present

## 2020-08-29 DIAGNOSIS — Z0000 Encounter for general adult medical examination without abnormal findings: Secondary | ICD-10-CM

## 2020-08-29 DIAGNOSIS — Z1211 Encounter for screening for malignant neoplasm of colon: Secondary | ICD-10-CM

## 2020-08-29 NOTE — Patient Instructions (Signed)
[name] [name] , Thank you for taking time to come for your Medicare Wellness Visit. I appreciate your ongoing commitment to your health goals. Please review the following plan we discussed and let me know if I can assist you in the future.   Screening recommendations/referrals: Colonoscopy: Currently due, orders placed this visit  Mammogram: Currently due, orders placed this visit  Bone Density: Up to date, next due 08/03/2021 Recommended yearly ophthalmology/optometry visit for glaucoma screening and checkup Recommended yearly dental visit for hygiene and checkup  Vaccinations: Influenza vaccine: Up to date, next due fall 2022  Pneumococcal vaccine: Completed series at today's visit  Tdap vaccine: Up to date, next due 07/21/2029 Shingles vaccine: Currently due for Shingrix, if you would like to receive we recommend that you do so at your local pharmacy.     Advanced directives: Please bring in copies of your advanced medical directives so that we may scan them into your chart.   Conditions/risks identified: None   Next appointment: 10/05/2020 @ 2:00 PM with Jiles Prows, NP    Preventive Care 65 Years and Older, Female Preventive care refers to lifestyle choices and visits with your health care provider that can promote health and wellness. What does preventive care include?  A yearly physical exam. This is also called an annual well check.  Dental exams once or twice a year.  Routine eye exams. Ask your health care provider how often you should have your eyes checked.  Personal lifestyle choices, including:  Daily care of your teeth and gums.  Regular physical activity.  Eating a healthy diet.  Avoiding tobacco and drug use.  Limiting alcohol use.  Practicing safe sex.  Taking low-dose aspirin every day.  Taking vitamin and mineral supplements as recommended by your health care provider. What happens during an annual well check? The services and screenings done by your  health care provider during your annual well check will depend on your age, overall health, lifestyle risk factors, and family history of disease. Counseling  Your health care provider may ask you questions about your:  Alcohol use.  Tobacco use.  Drug use.  Emotional well-being.  Home and relationship well-being.  Sexual activity.  Eating habits.  History of falls.  Memory and ability to understand (cognition).  Work and work Astronomer.  Reproductive health. Screening  You may have the following tests or measurements:  Height, weight, and BMI.  Blood pressure.  Lipid and cholesterol levels. These may be checked every 5 years, or more frequently if you are over 75 years old.  Skin check.  Lung cancer screening. You may have this screening every year starting at age 22 if you have a 30-pack-year history of smoking and currently smoke or have quit within the past 15 years.  Fecal occult blood test (FOBT) of the stool. You may have this test every year starting at age 34.  Flexible sigmoidoscopy or colonoscopy. You may have a sigmoidoscopy every 5 years or a colonoscopy every 10 years starting at age 73.  Hepatitis C blood test.  Hepatitis B blood test.  Sexually transmitted disease (STD) testing.  Diabetes screening. This is done by checking your blood sugar (glucose) after you have not eaten for a while (fasting). You may have this done every 1-3 years.  Bone density scan. This is done to screen for osteoporosis. You may have this done starting at age 2.  Mammogram. This may be done every 1-2 years. Talk to your health care provider about how  often you should have regular mammograms. Talk with your health care provider about your test results, treatment options, and if necessary, the need for more tests. Vaccines  Your health care provider may recommend certain vaccines, such as:  Influenza vaccine. This is recommended every year.  Tetanus, diphtheria, and  acellular pertussis (Tdap, Td) vaccine. You may need a Td booster every 10 years.  Zoster vaccine. You may need this after age 3.  Pneumococcal 13-valent conjugate (PCV13) vaccine. One dose is recommended after age 40.  Pneumococcal polysaccharide (PPSV23) vaccine. One dose is recommended after age 54. Talk to your health care provider about which screenings and vaccines you need and how often you need them. This information is not intended to replace advice given to you by your health care provider. Make sure you discuss any questions you have with your health care provider. Document Released: 04/22/2015 Document Revised: 12/14/2015 Document Reviewed: 01/25/2015 Elsevier Interactive Patient Education  2017 [place] Prevention in the Home Falls can cause injuries. They can happen to people of all ages. There are many things you can do to make your home safe and to help prevent falls. What can I do on the outside of my home?  Regularly fix the edges of walkways and driveways and fix any cracks.  Remove anything that might make you trip as you walk through a door, such as a raised step or threshold.  Trim any bushes or trees on the path to your home.  Use bright outdoor lighting.  Clear any walking paths of anything that might make someone trip, such as rocks or tools.  Regularly check to see if handrails are loose or broken. Make sure that both sides of any steps have handrails.  Any raised decks and porches should have guardrails on the edges.  Have any leaves, snow, or ice cleared regularly.  Use sand or salt on walking paths during winter.  Clean up any spills in your garage right away. This includes oil or grease spills. What can I do in the bathroom?  Use night lights.  Install grab bars by the toilet and in the tub and shower. Do not use towel bars as grab bars.  Use non-skid mats or decals in the tub or shower.  If you need to sit down in the shower, use  a plastic, non-slip stool.  Keep the floor dry. Clean up any water that spills on the floor as soon as it happens.  Remove soap buildup in the tub or shower regularly.  Attach bath mats securely with double-sided non-slip rug tape.  Do not have throw rugs and other things on the floor that can make you trip. What can I do in the bedroom?  Use night lights.  Make sure that you have a light by your bed that is easy to reach.  Do not use any sheets or blankets that are too big for your bed. They should not hang down onto the floor.  Have a firm chair that has side arms. You can use this for support while you get dressed.  Do not have throw rugs and other things on the floor that can make you trip. What can I do in the kitchen?  Clean up any spills right away.  Avoid walking on wet floors.  Keep items that you use a lot in easy-to-reach places.  If you need to reach something above you, use a strong step stool that has a grab bar.  Keep electrical  cords out of the way.  Do not use floor polish or wax that makes floors slippery. If you must use wax, use non-skid floor wax.  Do not have throw rugs and other things on the floor that can make you trip. What can I do with my stairs?  Do not leave any items on the stairs.  Make sure that there are handrails on both sides of the stairs and use them. Fix handrails that are broken or loose. Make sure that handrails are as long as the stairways.  Check any carpeting to make sure that it is firmly attached to the stairs. Fix any carpet that is loose or worn.  Avoid having throw rugs at the top or bottom of the stairs. If you do have throw rugs, attach them to the floor with carpet tape.  Make sure that you have a light switch at the top of the stairs and the bottom of the stairs. If you do not have them, ask someone to add them for you. What else can I do to help prevent falls?  Wear shoes that:  Do not have high heels.  Have  rubber bottoms.  Are comfortable and fit you well.  Are closed at the toe. Do not wear sandals.  If you use a stepladder:  Make sure that it is fully opened. Do not climb a closed stepladder.  Make sure that both sides of the stepladder are locked into place.  Ask someone to hold it for you, if possible.  Clearly mark and make sure that you can see:  Any grab bars or handrails.  First and last steps.  Where the edge of each step is.  Use tools that help you move around (mobility aids) if they are needed. These include:  Canes.  Walkers.  Scooters.  Crutches.  Turn on the lights when you go into a dark area. Replace any light bulbs as soon as they burn out.  Set up your furniture so you have a clear path. Avoid moving your furniture around.  If any of your floors are uneven, fix them.  If there are any pets around you, be aware of where they are.  Review your medicines with your doctor. Some medicines can make you feel dizzy. This can increase your chance of falling. Ask your doctor what other things that you can do to help prevent falls. This information is not intended to replace advice given to you by your health care provider. Make sure you discuss any questions you have with your health care provider. Document Released: 01/20/2009 Document Revised: 09/01/2015 Document Reviewed: 04/30/2014 Elsevier Interactive Patient Education  2017 [employer].

## 2020-08-29 NOTE — Addendum Note (Signed)
Addended by: Theodora Blow R on: 08/29/2020 11:16 AM   Modules accepted: Orders

## 2020-09-10 DIAGNOSIS — H524 Presbyopia: Secondary | ICD-10-CM | POA: Diagnosis not present

## 2020-09-10 DIAGNOSIS — H52223 Regular astigmatism, bilateral: Secondary | ICD-10-CM | POA: Diagnosis not present

## 2020-09-15 ENCOUNTER — Encounter: Payer: Self-pay | Admitting: *Deleted

## 2020-09-21 ENCOUNTER — Other Ambulatory Visit (INDEPENDENT_AMBULATORY_CARE_PROVIDER_SITE_OTHER): Payer: Self-pay | Admitting: Internal Medicine

## 2020-09-21 ENCOUNTER — Telehealth (INDEPENDENT_AMBULATORY_CARE_PROVIDER_SITE_OTHER): Payer: Self-pay

## 2020-09-21 DIAGNOSIS — M810 Age-related osteoporosis without current pathological fracture: Secondary | ICD-10-CM

## 2020-09-21 DIAGNOSIS — M179 Osteoarthritis of knee, unspecified: Secondary | ICD-10-CM

## 2020-09-21 DIAGNOSIS — M1990 Unspecified osteoarthritis, unspecified site: Secondary | ICD-10-CM

## 2020-09-21 NOTE — Telephone Encounter (Signed)
Pt want a referral to be seen [facility] [place]. She was thinking by the time she comes back she will have some results.

## 2020-09-21 NOTE — Telephone Encounter (Signed)
Ok thank you 

## 2020-09-21 NOTE — Telephone Encounter (Signed)
SAID HER ar IS GETTING TO BE WORSEN. SHE WANTED TO SEE THEM BEFORE COMING BACK TO YOU TO HAVE A VISIT. CALL OUT OF THE BLUE.

## 2020-09-21 NOTE — Telephone Encounter (Signed)
Okay I have put the referral in for rheumatology.

## 2020-09-21 NOTE — Telephone Encounter (Signed)
Why does she want to see a rheumatologist?

## 2020-09-23 ENCOUNTER — Other Ambulatory Visit (INDEPENDENT_AMBULATORY_CARE_PROVIDER_SITE_OTHER): Payer: Self-pay | Admitting: Internal Medicine

## 2020-09-23 DIAGNOSIS — E039 Hypothyroidism, unspecified: Secondary | ICD-10-CM

## 2020-09-28 ENCOUNTER — Other Ambulatory Visit (INDEPENDENT_AMBULATORY_CARE_PROVIDER_SITE_OTHER): Payer: Self-pay | Admitting: Nurse Practitioner

## 2020-09-28 DIAGNOSIS — I10 Essential (primary) hypertension: Secondary | ICD-10-CM

## 2020-09-29 ENCOUNTER — Other Ambulatory Visit (INDEPENDENT_AMBULATORY_CARE_PROVIDER_SITE_OTHER): Payer: Self-pay | Admitting: Internal Medicine

## 2020-09-29 DIAGNOSIS — G2581 Restless legs syndrome: Secondary | ICD-10-CM

## 2020-10-05 ENCOUNTER — Telehealth (INDEPENDENT_AMBULATORY_CARE_PROVIDER_SITE_OTHER): Payer: Self-pay | Admitting: Nurse Practitioner

## 2020-10-05 ENCOUNTER — Encounter (INDEPENDENT_AMBULATORY_CARE_PROVIDER_SITE_OTHER): Payer: Self-pay | Admitting: Nurse Practitioner

## 2020-10-05 ENCOUNTER — Other Ambulatory Visit: Payer: Self-pay

## 2020-10-05 ENCOUNTER — Ambulatory Visit (INDEPENDENT_AMBULATORY_CARE_PROVIDER_SITE_OTHER): Payer: Medicare HMO | Admitting: Nurse Practitioner

## 2020-10-05 VITALS — BP 132/64 | HR 85 | Temp 97.1°F | Ht 60.0 in | Wt 138.8 lb

## 2020-10-05 DIAGNOSIS — I10 Essential (primary) hypertension: Secondary | ICD-10-CM

## 2020-10-05 DIAGNOSIS — Z139 Encounter for screening, unspecified: Secondary | ICD-10-CM

## 2020-10-05 DIAGNOSIS — Z13220 Encounter for screening for lipoid disorders: Secondary | ICD-10-CM | POA: Diagnosis not present

## 2020-10-05 DIAGNOSIS — Z131 Encounter for screening for diabetes mellitus: Secondary | ICD-10-CM

## 2020-10-05 DIAGNOSIS — E559 Vitamin D deficiency, unspecified: Secondary | ICD-10-CM | POA: Diagnosis not present

## 2020-10-05 DIAGNOSIS — Z0001 Encounter for general adult medical examination with abnormal findings: Secondary | ICD-10-CM | POA: Diagnosis not present

## 2020-10-05 DIAGNOSIS — E039 Hypothyroidism, unspecified: Secondary | ICD-10-CM | POA: Diagnosis not present

## 2020-10-05 LAB — COMPLETE METABOLIC PANEL WITH GFR
AG Ratio: 1.9 (calc) (ref 1.0–2.5)
ALT: 23 U/L (ref 6–29)
AST: 23 U/L (ref 10–35)
Albumin: 4.3 g/dL (ref 3.6–5.1)
Alkaline phosphatase (APISO): 124 U/L (ref 37–153)
BUN: 16 mg/dL (ref 7–25)
CO2: 27 mmol/L (ref 20–32)
Calcium: 10.2 mg/dL (ref 8.6–10.4)
Chloride: 103 mmol/L (ref 98–110)
Creat: 0.92 mg/dL (ref 0.60–0.93)
GFR, Est African American: 72 mL/min/{1.73_m2} (ref >=60)
GFR, Est Non African American: 62 mL/min/{1.73_m2} (ref >=60)
Globulin: 2.3 g/dL (calc) (ref 1.9–3.7)
Glucose, Bld: 75 mg/dL (ref 65–139)
Potassium: 4.3 mmol/L (ref 3.5–5.3)
Sodium: 139 mmol/L (ref 135–146)
Total Bilirubin: 0.4 mg/dL (ref 0.2–1.2)
Total Protein: 6.6 g/dL (ref 6.1–8.1)

## 2020-10-05 LAB — LIPID PANEL
Cholesterol: 181 mg/dL (ref <200)
HDL: 43 mg/dL — ABNORMAL LOW (ref >=50)
LDL Cholesterol (Calc): 112 mg/dL (calc) — ABNORMAL HIGH
Non-HDL Cholesterol (Calc): 138 mg/dL (calc) — ABNORMAL HIGH (ref <130)
Total CHOL/HDL Ratio: 4.2 (calc) (ref <5.0)
Triglycerides: 149 mg/dL (ref <150)

## 2020-10-05 LAB — CBC
HCT: 41.1 % (ref 35.0–45.0)
Hemoglobin: 13.1 g/dL (ref 11.7–15.5)
MCH: 27 pg (ref 27.0–33.0)
MCHC: 31.9 g/dL — ABNORMAL LOW (ref 32.0–36.0)
MCV: 84.6 fL (ref 80.0–100.0)
MPV: 10.2 fL (ref 7.5–12.5)
Platelets: 328 10*3/uL (ref 140–400)
RBC: 4.86 10*6/uL (ref 3.80–5.10)
RDW: 13.7 % (ref 11.0–15.0)
WBC: 11.4 10*3/uL — ABNORMAL HIGH (ref 3.8–10.8)

## 2020-10-05 LAB — VITAMIN D 25 HYDROXY (VIT D DEFICIENCY, FRACTURES): Vit D, 25-Hydroxy: 65 ng/mL (ref 30–100)

## 2020-10-05 LAB — TSH: TSH: 6.89 mIU/L — ABNORMAL HIGH (ref 0.40–4.50)

## 2020-10-05 NOTE — Progress Notes (Signed)
Subjective:  Patient ID: [name] [name], female    DOB: May 17, 1945  Age: 75 y.o. MRN: [id number]  CC:  Chief Complaint  Patient presents with   Annual Exam      HPI  This patient arrives today for the above.  She is due for mammogram which was ordered last month but has not been scheduled to look into this.  She has colonoscopy scheduled in coming up shortly.  She is due for the second booster shot of the COVID-19 vaccine.  Last DEXA scan was completed approximately 1 year ago.  She has no acute complaints today.  Past Medical History:  Diagnosis Date   COPD (chronic obstructive pulmonary disease) (HCC)    Depression    Essential hypertension, benign 01/21/2019   History of pneumonia    2010 on ventalator   History of recurrent UTIs    Hypothyroidism    Insomnia 12/18/2018   Left knee DJD    Osteoporosis    PTSD (post-traumatic stress disorder)    Restless leg syndrome       Family History  Problem Relation Age of Onset   Stroke Mother    Heart disease Father    Heart attack Father    Hypertension Father     Social History   Social History Narrative   Married 22 years in 2nd marriage.First marriage lasted 20 years.Retired,now a Education officer, environmental.   Social History   Tobacco Use   Smoking status: Former    Packs/day: 1.50    Years: 35.00    Pack years: 52.50    Types: Cigarettes    Quit date: 04/10/2003    Years since quitting: 17.5   Smokeless tobacco: Never  Substance Use Topics   Alcohol use: No     Current Meds  Medication Sig   acetaminophen (TYLENOL) 325 MG tablet Take 2 tablets (650 mg total) by mouth every 6 (six) hours as needed for mild pain (or Fever >/= 101).   ALPRAZolam (XANAX) 0.5 MG tablet Take 1 tablet (0.5 mg total) by mouth at bedtime as needed for anxiety.   amitriptyline (ELAVIL) 50 MG tablet TAKE 1 TABLET EVERY DAY   amLODipine (NORVASC) 2.5 MG tablet TAKE 1 TABLET EVERY DAY   Cholecalciferol 125 MCG (5000 UT) TABS Take 1 tablet by  mouth daily.   gabapentin (NEURONTIN) 300 MG capsule Take 2 capsules (600 mg total) by mouth at bedtime.   levothyroxine (SYNTHROID) 75 MCG tablet TAKE 1 TABLET EVERY DAY   [DISCONTINUED] Cholecalciferol 1.25 MG (50000 UT) TABS Take 1 tablet by mouth daily.    ROS:  Review of Systems  Respiratory:  Negative for shortness of breath.   Cardiovascular:  Negative for chest pain.  Gastrointestinal:  Negative for abdominal pain.  Neurological:  Negative for dizziness and headaches.    Objective:   Today's Vitals: BP 132/64   Pulse 85   Temp (!) 97.1 F (36.2 C)   Ht 5' (1.524 m)   Wt 138 lb 12.8 oz (63 kg)   SpO2 95%   BMI 27.11 kg/m  Vitals with BMI 10/05/2020 08/29/2020 06/13/2020  Height 5\' 0"  5\' 0"  5\' 0"   Weight 138 lbs 13 oz 141 lbs 142 lbs 3 oz  BMI 27.11 27.54 27.77  Systolic 132 132  Diastolic 64 64 72  Pulse 85 84 81     Physical Exam Vitals reviewed. Exam conducted with a chaperone present.  Constitutional:      Appearance: Normal appearance.  HENT:     Head: Normocephalic and atraumatic.     Right Ear: Tympanic membrane, ear canal and external ear normal.     Left Ear: Tympanic membrane, ear canal and external ear normal.  Eyes:     General:        Right eye: No discharge.        Left eye: No discharge.     Extraocular Movements: Extraocular movements intact.     Conjunctiva/sclera: Conjunctivae normal.     Pupils: Pupils are equal, round, and reactive to light.  Neck:     Vascular: No carotid bruit.  Cardiovascular:     Rate and Rhythm: Normal rate and regular rhythm.     Pulses: Normal pulses.     Heart sounds: Normal heart sounds. No murmur heard. Pulmonary:     Effort: Pulmonary effort is normal.     Breath sounds: Normal breath sounds.  Chest:  Breasts:    Breasts are symmetrical.     Right: Normal. No supraclavicular adenopathy.     Left: Normal. No supraclavicular adenopathy.     Comments: Nellie Hairston as chaperone Abdominal:      General: Abdomen is flat. Bowel sounds are normal. There is no distension.     Palpations: Abdomen is soft. There is no mass.     Tenderness: There is no abdominal tenderness.  Musculoskeletal:        General: No tenderness.     Cervical back: Neck supple. No muscular tenderness.     Right lower leg: No edema.     Left lower leg: No edema.  Lymphadenopathy:     Cervical: No cervical adenopathy.     Upper Body:     Right upper body: No supraclavicular adenopathy.     Left upper body: No supraclavicular adenopathy.  Skin:    General: Skin is warm and dry.  Neurological:     General: No focal deficit present.     Mental Status: She is alert and oriented to person, place, and time.     Motor: No weakness.     Gait: Gait normal.  Psychiatric:        Mood and Affect: Mood normal.        Behavior: Behavior normal.        Judgment: Judgment normal.         Assessment and Plan   1. Encounter for general adult medical examination with abnormal findings   2. Hypothyroidism, unspecified type   3. Vitamin D deficiency disease   4. Screening for condition   5. Screening for diabetes mellitus   6. Screening for hyperlipidemia   7. Essential hypertension, benign      Plan: 1.-7.  We will collect blood work for further evaluation today.  I am I will have staff look into her mammogram make sure this get scheduled.  She will follow-up in the colonoscopy as scheduled.  She is encouraged let us know she has any questions or concerns in the near future.  She will continue taking her chronic medications as currently prescribed.   Tests ordered Orders Placed This Encounter  Procedures   CBC   COMPLETE METABOLIC PANEL WITH GFR   Hemoglobin A1c   Lipid panel   TSH   Vitamin D, 25-hydroxy      No orders of the defined types were placed in this encounter.   Patient to follow-up in 3 months or sooner as needed.  Elenore Paddy, NP

## 2020-10-05 NOTE — Telephone Encounter (Signed)
When I try to schedule last time , she wanted to wait and let me know. So I will do this time now.

## 2020-10-05 NOTE — Telephone Encounter (Signed)
Will you check on scheduling for patient's screening mammogram that was ordered last month?

## 2020-10-06 ENCOUNTER — Other Ambulatory Visit (INDEPENDENT_AMBULATORY_CARE_PROVIDER_SITE_OTHER): Payer: Self-pay | Admitting: Nurse Practitioner

## 2020-10-06 DIAGNOSIS — E039 Hypothyroidism, unspecified: Secondary | ICD-10-CM

## 2020-10-06 MED ORDER — LEVOTHYROXINE SODIUM 88 MCG PO TABS: 88.0000 ug | ORAL_TABLET | Freq: Every day | ORAL | 2 refills | Status: DC

## 2020-11-03 ENCOUNTER — Ambulatory Visit: Payer: Medicare HMO | Admitting: Internal Medicine

## 2020-11-07 NOTE — Progress Notes (Signed)
Office Visit Note  Patient: [name] [name]             Date of Birth: Aug 14, 1945           MRN: [id number]             PCP: Wilson Singer, MD Referring: Wilson Singer, MD Visit Date: 11/08/2020  Subjective:  New Patient (Initial Visit) (Patient complains of left sided neck pain with radiation to left upper extremity and bilateral hip pain. Patient complains of left shoulder pain as well and has been told she has arthritis causing the pain by ortho. )   History of Present Illness: [name] [name] is a 75 y.o. female here for evaluation of chronic arthritis of multiple sites but more recently increased in the shoulders and hips. She has osteoarthritis in many areas and previous left knee arthroplasty years ago with good results. She does not follow up with orthopedics clinic very recently. She takes tylenol and aleve regularly, multiple times per day when needed which helps symptoms fairly well although not complete. She has joint stiffness in the morning and after sitting still but improves quickly. She does not notice and particular joint swelling. Pain is worst when carrying a heavy weight especially raising overhead.  She has a history of osteoporosis based on most recent bone densitometry test with t-score -2.6 of left femoral neck. She takes calcium and vitamin D but no pharmacotherapy for this. She has discussed possible implants with her dentist due to loss of teeth and bridges.   Activities of Daily Living:  Patient reports morning stiffness for 1-30 minutes.   Patient Reports nocturnal pain.  Difficulty dressing/grooming: Reports Difficulty climbing stairs: Denies Difficulty getting out of chair: Denies Difficulty using hands for taps, buttons, cutlery, and/or writing: Reports  Review of Systems  Constitutional:  Positive for fatigue.  HENT:  Negative for mouth sores, mouth dryness and nose dryness.   Eyes:  Positive for visual disturbance and dryness. Negative for pain  and itching.  Respiratory:  Positive for shortness of breath and difficulty breathing. Negative for cough and hemoptysis.   Cardiovascular:  Negative for chest pain, palpitations and swelling in legs/feet.  Gastrointestinal:  Negative for abdominal pain, blood in stool, constipation and diarrhea.  Endocrine: Negative for increased urination.  Genitourinary:  Negative for painful urination.  Musculoskeletal:  Positive for joint pain, joint pain, joint swelling, myalgias, muscle weakness, morning stiffness, muscle tenderness and myalgias.  Skin:  Positive for rash. Negative for color change and redness.  Allergic/Immunologic: Negative for susceptible to infections.  Neurological:  Negative for dizziness, numbness, headaches, memory loss and weakness.  Hematological:  Negative for swollen glands.  Psychiatric/Behavioral:  Negative for confusion and sleep disturbance.    PMFS History:  Patient Active Problem List   Diagnosis Date Noted   Recurrent urinary tract infection 11/08/2020   Bilateral shoulder pain 11/08/2020   Age-related osteoporosis without current pathological fracture 07/22/2019   Upper airway cough syndrome 03/04/2019   DOE (dyspnea on exertion) 03/03/2019   Essential hypertension, benign 01/21/2019   Insomnia 12/18/2018   Vitamin D deficiency disease 12/18/2018   Pulmonary nodule 04/27/2016   Left knee DJD 08/24/2013   DJD (degenerative joint disease) of knee 08/24/2013   Hypothyroidism    Restless leg syndrome    Depression    Anxiety    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      Past Medical History:  Diagnosis Date   COPD (chronic obstructive pulmonary disease) (  HCC)    Depression    Essential hypertension, benign 01/21/2019   History of pneumonia    2010 on ventalator   History of recurrent UTIs    Hypothyroidism    Insomnia 12/18/2018   Left knee DJD    Osteoporosis    PTSD (post-traumatic stress disorder)    Restless leg syndrome     Family History  Problem Relation Age  of Onset   Stroke Mother    Heart disease Father    Heart attack Father    Hypertension Father    Heart attack Brother    Emphysema Son    Hypertension Son    Emphysema Daughter    Past Surgical History:  Procedure Laterality Date   ABDOMINAL HYSTERECTOMY     KNEE ARTHROSCOPY WITH MEDIAL MENISECTOMY Left 08/24/2013   Procedure: LEFT KNEE ARTHROSCOPY WITH MEDIAL MENISCECTOMY  ;  Surgeon: Nilda Simmer, MD;  Location: [facility] OR;  Service: Orthopedics;  Laterality: Left;   PARTIAL KNEE ARTHROPLASTY Left 08/24/2013   dr Denton Ar   PARTIAL KNEE ARTHROPLASTY Left 08/24/2013   Procedure: LEFT KNEE ARTHROPLASTY UNI COMPARTMENTAL ;  Surgeon: Nilda Simmer, MD;  Location: [facility] OR;  Service: Orthopedics;  Laterality: Left;  Left knee arthroplasty uni compartment  With left knee scope medial meniscectomy     TOTAL VAGINAL HYSTERECTOMY     Social History   Social History Narrative   Married 22 years in 2nd marriage.First marriage lasted 20 years.Retired,now a Education officer, environmental.   Immunization History  Administered Date(s) Administered   Fluad Quad(high Dose 65+) 01/21/2019, 03/15/2020   Moderna Sars-Covid-2 Vaccination 02/13/2020   PFIZER(Purple Top)SARS-COV-2 Vaccination 05/25/2019, 06/22/2019   Pneumococcal Conjugate-13 08/29/2020   Pneumococcal Polysaccharide-23 01/21/2019   Tdap 07/22/2019   Zoster, Live 04/09/2013     Objective: Vital Signs: BP (!) 132/93 (BP Location: Right Arm, Patient Position: Sitting, Cuff Size: Normal)   Pulse 75   Ht 4' 11.75" (1.518 m)   Wt 141 lb 9.6 oz (64.2 kg)   BMI 27.89 kg/m    Physical Exam Eyes:     Conjunctiva/sclera: Conjunctivae normal.  Cardiovascular:     Rate and Rhythm: Normal rate and regular rhythm.  Pulmonary:     Effort: Pulmonary effort is normal.     Breath sounds: Normal breath sounds.  Skin:    General: Skin is warm and dry.     Findings: No rash.  Neurological:     General: No focal deficit present.     Mental Status: She is alert.   Psychiatric:        Mood and Affect: Mood normal.     Musculoskeletal Exam:  Left shoulder severe decreased abduction, external rotation, and extension, right shoulder mildly decreased abduction, no swelling no AC joint tenderness Elbows full ROM no tenderness or swelling Wrists full ROM no tenderness or swelling Fingers with extensive heberdon's nodes at DIP joints b/l, digital cyst of left 2nd finger Normal hip rotation ROM b/l Right knee patellofemoral crepitus, left knee well healed arthroplasty, full ROM Ankles full ROM no tenderness or swelling  Investigation: No additional findings.  Imaging: XR Shoulder Left  Result Date: 11/08/2020 X-ray left shoulder 4 views Severe glenohumeral joint osteoarthritis with complete joint space loss prominent subchondral sclerosis and extensive osteophytes along humeral head.  AC joint space appears preserved.  Shoulder outlet space appears preserved.  No significant joint effusion or calcifications are visible. Impression Severe glenohumeral joint osteoarthritis with extensive degenerative changes especially of humeral head, no visible  erosive or inflammatory changes.  XR Shoulder Right  Result Date: 11/08/2020 X-ray right shoulder 4 views Osteoarthritic change prominently on head of humerus with relatively preserved glenoid there is a large patent outlet space.  No significant joint effusion or calcification seen.  AC joint space is well-preserved.  Tubercle present on medial aspect of humerus with no adjacent abnormalities. Impression Glenohumeral joint osteoarthritis with no erosions or acute abnormality   Recent Labs: Lab Results  Component Value Date   WBC 11.4 (H) 10/05/2020   HGB 13.1 10/05/2020   PLT 328 10/05/2020   NA 139 10/05/2020   K 4.3 10/05/2020   CL 103 10/05/2020   CO2 27 10/05/2020   GLUCOSE 75 10/05/2020   BUN 16 10/05/2020   CREATININE 0.92 10/05/2020   BILITOT 0.4 10/05/2020   ALKPHOS 144 (H) 04/12/2016   AST 23  10/05/2020   ALT 23 10/05/2020   PROT 6.6 10/05/2020   ALBUMIN 3.6 04/12/2016   CALCIUM 10.2 10/05/2020   GFRAA 72 10/05/2020    Speciality Comments: No specialty comments available.  Procedures:  Large Joint Inj: L glenohumeral on 11/08/2020 10:08 AM Indications: pain Details: 25 G 1.5 in needle, posterior approach Medications: 3 mL lidocaine 1 %; 40 mg triamcinolone acetonide 40 MG/ML Outcome: tolerated well, no immediate complications Procedure, treatment alternatives, risks and benefits explained, specific risks discussed. Consent was given by the patient. Immediately prior to procedure a time out was called to verify the correct patient, procedure, equipment, support staff and site/side marked as required. Patient was prepped and draped in the usual sterile fashion.    Allergies: Codeine   Assessment / Plan:     Visit Diagnoses: Chronic pain of both shoulders - Plan: XR Shoulder Left, XR Shoulder Right, Ambulatory referral to Orthopedic Surgery  Should pain seems consistent with severe osteoarthritis and bilateral xrays checked in clinic today demonstrate this. Left glenohumeral joint steroid injection performed for symptomatic treatment. However I recommend she consider orthopedic surgery evaluation for this, will refer. She has a previous orthopedist but has not seen them in some time and also wound be interested in different opinion.  Age-related osteoporosis without current pathological fracture  Discussed she would be a candidate for medical management although with upcoming possible dental surgery recommend this be resolved before starting antiresorptive treatment.  Orders: Orders Placed This Encounter  Procedures   Large Joint Inj   XR Shoulder Left   XR Shoulder Right   Ambulatory referral to Orthopedic Surgery    No orders of the defined types were placed in this encounter.    Follow-Up Instructions: Return if symptoms worsen or fail to improve.   Fuller Plan, MD  Note - This record has been created using [employer].  Chart creation errors have been sought, but may not always  have been located. Such creation errors do not reflect on  the standard of medical care.

## 2020-11-08 ENCOUNTER — Ambulatory Visit: Payer: Self-pay

## 2020-11-08 ENCOUNTER — Ambulatory Visit: Payer: Medicare HMO | Admitting: Internal Medicine

## 2020-11-08 ENCOUNTER — Encounter: Payer: Self-pay | Admitting: Internal Medicine

## 2020-11-08 ENCOUNTER — Other Ambulatory Visit: Payer: Self-pay

## 2020-11-08 VITALS — BP 132/93 | HR 75 | Ht 59.75 in | Wt 141.6 lb

## 2020-11-08 DIAGNOSIS — M25512 Pain in left shoulder: Secondary | ICD-10-CM | POA: Insufficient documentation

## 2020-11-08 DIAGNOSIS — M25511 Pain in right shoulder: Secondary | ICD-10-CM | POA: Diagnosis not present

## 2020-11-08 DIAGNOSIS — G8929 Other chronic pain: Secondary | ICD-10-CM

## 2020-11-08 DIAGNOSIS — M810 Age-related osteoporosis without current pathological fracture: Secondary | ICD-10-CM

## 2020-11-08 DIAGNOSIS — N390 Urinary tract infection, site not specified: Secondary | ICD-10-CM | POA: Insufficient documentation

## 2020-11-08 MED ORDER — LIDOCAINE HCL 1 % IJ SOLN
3.0000 mL | INTRAMUSCULAR | Status: AC | PRN
  Administered 2020-11-08: 3 mL

## 2020-11-08 MED ORDER — TRIAMCINOLONE ACETONIDE 40 MG/ML IJ SUSP
40.0000 mg | INTRAMUSCULAR | Status: AC | PRN
  Administered 2020-11-08: 40 mg via INTRA_ARTICULAR

## 2020-11-09 ENCOUNTER — Telehealth (INDEPENDENT_AMBULATORY_CARE_PROVIDER_SITE_OTHER): Payer: Self-pay

## 2020-11-09 ENCOUNTER — Ambulatory Visit (INDEPENDENT_AMBULATORY_CARE_PROVIDER_SITE_OTHER): Payer: Medicare HMO | Admitting: Nurse Practitioner

## 2020-11-09 DIAGNOSIS — E039 Hypothyroidism, unspecified: Secondary | ICD-10-CM

## 2020-11-09 MED ORDER — LEVOTHYROXINE SODIUM 88 MCG PO TABS: 88.0000 ug | ORAL_TABLET | Freq: Every day | ORAL | 0 refills | Status: DC

## 2020-11-09 NOTE — Telephone Encounter (Signed)
Refill approved and sent to [facility]

## 2020-11-09 NOTE — Telephone Encounter (Signed)
Patient called and asked if you could send this medication for 90 days to [facility] pharmacy for her?  levothyroxine (SYNTHROID) 88 MCG tablet  Last filled 10/06/2020, # 30 with 2 refills

## 2020-11-21 ENCOUNTER — Ambulatory Visit: Payer: Medicare HMO | Admitting: Orthopedic Surgery

## 2020-11-21 ENCOUNTER — Other Ambulatory Visit: Payer: Self-pay

## 2020-11-21 DIAGNOSIS — M25512 Pain in left shoulder: Secondary | ICD-10-CM | POA: Diagnosis not present

## 2020-11-29 ENCOUNTER — Encounter: Payer: Self-pay | Admitting: Orthopedic Surgery

## 2020-11-29 NOTE — Progress Notes (Signed)
Office Visit Note   Patient: [name] [name]           Date of Birth: January 07, 1946           MRN: [id number] Visit Date: 11/21/2020 Requested by: Fuller Plan, MD [address] [place],  [place] [address] PCP: Wilson Singer, MD (Inactive)  Subjective: Chief Complaint  Patient presents with   Left Shoulder - Pain   Right Shoulder - Pain    HPI: [name] [name] is a 75 y.o. female who presents to the office complaining of bilateral shoulder pain left greater than right.  Patient complains of chronic pain in her shoulder for several years.  She complains of lateral pain primarily in the left shoulder that radiates down to her elbow and occasionally into her neck.  Denies any scapular pain, radiation into the hand, consistent numbness/tingling.  She states that the shoulder is too painful to really move and she notices cracking and grinding with any small movement of the shoulder.  No history of dislocation or surgery to her shoulder or neck.  She wakes with pain pretty much every night.  She has had injections, most recently on 11/08/2020 that provided no relief.  Not really any pain in her right shoulder relative to the left.  She denies any history of diabetes, cardiac disease, blood thinner use, smoking, pulmonary disease.  She is retired.  She enjoys taking care of her yard as well as household chores.  She lives with her husband and 2 grandchildren and son..                ROS: All systems reviewed are negative as they relate to the chief complaint within the history of present illness.  Patient denies fevers or chills.  Assessment & Plan: Visit Diagnoses:  1. Left shoulder pain, unspecified chronicity     Plan: Patient is a 75 year old female who presents complaining of primarily left shoulder pain.  Not really much pain in the right shoulder.  Is been going on for several years and has gotten to the point where she has pain that wakes her up every night and pain with  pretty much any movement of the shoulder.  Injections have provided no relief.  Radiographs taken previously were reviewed and demonstrate severe osteoarthritis, worst in the left shoulder relative to the right.  No significant medical history that would preclude her from surgery but did go over the risks and benefits of the surgical procedure today as well as the recovery time frame.  Risk and benefits include risk of nerve/vessel damage, shoulder stiffness, shoulder instability, operative complication such as fracture, medical complication from surgery, prosthetic joint infection, need for revision surgery.  After lengthy discussion, patient would like to proceed with scheduling surgery.  She did have recent injections on 11/08/2020 meaning that she will have to wait about 3 months out from this injection before we can do surgery.  She is okay as long as this gets done this year.  Order thin cut CT scan of the left shoulder for preoperative planning purposes.  Follow-up after CT scan to review results and schedule her for surgery with Dr. August Saucer.  Follow-Up Instructions: No follow-ups on file.   Orders:  Orders Placed This Encounter  Procedures   CT SHOULDER LEFT WO CONTRAST   No orders of the defined types were placed in this encounter.     Procedures: No procedures performed   Clinical Data: No additional findings.  Objective: Vital Signs: There were no vitals taken for this visit.  Physical Exam:  Constitutional: Patient appears well-developed HEENT:  Head: Normocephalic Eyes:EOM are normal Neck: Normal range of motion Cardiovascular: Normal rate Pulmonary/chest: Effort normal Neurologic: Patient is alert Skin: Skin is warm Psychiatric: Patient has normal mood and affect  Ortho Exam: On exam she has 35 degrees external rotation, 50 degrees abduction, 95 degrees forward flexion of the left shoulder passively.  This compared with the right shoulder with 70 degrees external rotation,  75 degrees abduction, 120 degrees forward flexion passively.  Course grinding noted consistent with osteoarthritis with passive motion of the left shoulder.  Patient has pain with passive motion of the shoulder.  Excellent rotator cuff strength of supra, infra, subscap.  5/5 motor strength of bilateral grip strength, finger abduction, pronation/supination, bicep, tricep, deltoid.  Axillary nerve intact with deltoid firing.  Specialty Comments:  No specialty comments available.  Imaging: No results found.   PMFS History: Patient Active Problem List   Diagnosis Date Noted   Recurrent urinary tract infection 11/08/2020   Bilateral shoulder pain 11/08/2020   Age-related osteoporosis without current pathological fracture 07/22/2019   Upper airway cough syndrome 03/04/2019   DOE (dyspnea on exertion) 03/03/2019   Essential hypertension, benign 01/21/2019   Insomnia 12/18/2018   Vitamin D deficiency disease 12/18/2018   Pulmonary nodule 04/27/2016   Left knee DJD 08/24/2013   DJD (degenerative joint disease) of knee 08/24/2013   Hypothyroidism    Restless leg syndrome    Depression    Anxiety    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     Past Medical History:  Diagnosis Date   COPD (chronic obstructive pulmonary disease) (HCC)    Depression    Essential hypertension, benign 01/21/2019   History of pneumonia    2010 on ventalator   History of recurrent UTIs    Hypothyroidism    Insomnia 12/18/2018   Left knee DJD    Osteoporosis    PTSD (post-traumatic stress disorder)    Restless leg syndrome     Family History  Problem Relation Age of Onset   Stroke Mother    Heart disease Father    Heart attack Father    Hypertension Father    Heart attack Brother    Emphysema Son    Hypertension Son    Emphysema Daughter     Past Surgical History:  Procedure Laterality Date   ABDOMINAL HYSTERECTOMY     KNEE ARTHROSCOPY WITH MEDIAL MENISECTOMY Left 08/24/2013   Procedure: LEFT KNEE ARTHROSCOPY WITH  MEDIAL MENISCECTOMY  ;  Surgeon: Nilda Simmer, MD;  Location: [facility] OR;  Service: Orthopedics;  Laterality: Left;   PARTIAL KNEE ARTHROPLASTY Left 08/24/2013   dr Denton Ar   PARTIAL KNEE ARTHROPLASTY Left 08/24/2013   Procedure: LEFT KNEE ARTHROPLASTY UNI COMPARTMENTAL ;  Surgeon: Nilda Simmer, MD;  Location: [facility] OR;  Service: Orthopedics;  Laterality: Left;  Left knee arthroplasty uni compartment  With left knee scope medial meniscectomy     TOTAL VAGINAL HYSTERECTOMY     Social History   Occupational History   Occupation: Retired  Tobacco Use   Smoking status: Former    Packs/day: 1.50    Years: 35.00    Pack years: 52.50    Types: Cigarettes    Quit date: 04/10/2003    Years since quitting: 17.6   Smokeless tobacco: Never  Vaping Use   Vaping Use: Never used  Substance and Sexual Activity   Alcohol  use: No   Drug use: No   Sexual activity: Not on file

## 2020-11-30 ENCOUNTER — Encounter: Payer: Self-pay | Admitting: Orthopedic Surgery

## 2020-12-03 ENCOUNTER — Ambulatory Visit
Admission: RE | Admit: 2020-12-03 | Discharge: 2020-12-03 | Disposition: A | Discharge status: Home / Self Care | Payer: Medicare HMO | Source: Ambulatory Visit | Attending: Orthopedic Surgery | Admitting: Orthopedic Surgery

## 2020-12-03 ENCOUNTER — Other Ambulatory Visit: Payer: Self-pay

## 2020-12-03 DIAGNOSIS — M19012 Primary osteoarthritis, left shoulder: Secondary | ICD-10-CM | POA: Diagnosis not present

## 2020-12-03 DIAGNOSIS — M25412 Effusion, left shoulder: Secondary | ICD-10-CM | POA: Diagnosis not present

## 2020-12-03 DIAGNOSIS — M25512 Pain in left shoulder: Secondary | ICD-10-CM

## 2020-12-05 NOTE — Progress Notes (Signed)
Pls have her f/u with me sometime thx

## 2020-12-21 ENCOUNTER — Ambulatory Visit: Payer: Medicare HMO | Admitting: Orthopedic Surgery

## 2020-12-21 ENCOUNTER — Other Ambulatory Visit: Payer: Self-pay

## 2020-12-21 DIAGNOSIS — M19012 Primary osteoarthritis, left shoulder: Secondary | ICD-10-CM

## 2020-12-25 ENCOUNTER — Encounter: Payer: Self-pay | Admitting: Orthopedic Surgery

## 2020-12-25 NOTE — Progress Notes (Signed)
Office Visit Note   Patient: [name] [name]           Date of Birth: Sep 22, 1945           MRN: [id number] Visit Date: 12/21/2020 Requested by: No referring provider defined for this encounter. PCP: Wilson Singer, MD (Inactive)  Subjective: Chief Complaint  Patient presents with   Left Shoulder - Follow-up    HPI: [name] [name] is a 75 year old patient with left shoulder pain.  Hard for her to put her hand on the steering wheel of the car.  Pain wakes her from sleep on a nightly basis.  Does radiate to some degree into the neck.  Since she was last seen she has had a CT scan which shows severe glenohumeral joint arthritis as well as multiple loose bodies and some moderate clinical deformity.  She has failed conservative management and presents now for operative management.              ROS: All systems reviewed are negative as they relate to the chief complaint within the history of present illness.  Patient denies  fevers or chills.   Assessment & Plan: Visit Diagnoses:  1. Arthritis of left shoulder region     Plan: Impression is left shoulder severe end-stage arthritis with significant limitation of motion.  Plan reverse shoulder replacement after risk and benefits are discussed including not limited to infection nerve vessel damage instability incomplete pain relief as well as potential need for further surgery.  Patient understands the risk and benefits.  All questions answered.  Follow-Up Instructions: No follow-ups on file.   Orders:  No orders of the defined types were placed in this encounter.  No orders of the defined types were placed in this encounter.     Procedures: No procedures performed   Clinical Data: No additional findings.  Objective: Vital Signs: There were no vitals taken for this visit.  Physical Exam:   Constitutional: Patient appears well-developed HEENT:  Head: Normocephalic Eyes:EOM are normal Neck: Normal range of motion Cardiovascular:  Normal rate Pulmonary/chest: Effort normal Neurologic: Patient is alert Skin: Skin is warm Psychiatric: Patient has normal mood and affect   Ortho Exam: Ortho exam demonstrates range of motion on the left passive at 10/45/80.  On the right her range of motion is 60/80/40.  Rotator cuff strength slightly weaker on the left likely due to pain compared to the right to supraspinatus and infraspinatus testing.  Motor or sensory function to the hand is intact.  Deltoid fires.  Neck range of motion is full.  Radial pulse intact.  Specialty Comments:  No specialty comments available.  Imaging: No results found.   PMFS History: Patient Active Problem List   Diagnosis Date Noted   Recurrent urinary tract infection 11/08/2020   Bilateral shoulder pain 11/08/2020   Age-related osteoporosis without current pathological fracture 07/22/2019   Upper airway cough syndrome 03/04/2019   DOE (dyspnea on exertion) 03/03/2019   Essential hypertension, benign 01/21/2019   Insomnia 12/18/2018   Vitamin D deficiency disease 12/18/2018   Pulmonary nodule 04/27/2016   Left knee DJD 08/24/2013   DJD (degenerative joint disease) of knee 08/24/2013   Hypothyroidism    Restless leg syndrome    Depression    Anxiety    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     Past Medical History:  Diagnosis Date   COPD (chronic obstructive pulmonary disease) (HCC)    Depression    Essential hypertension, benign 01/21/2019   History  of pneumonia    2010 on ventalator   History of recurrent UTIs    Hypothyroidism    Insomnia 12/18/2018   Left knee DJD    Osteoporosis    PTSD (post-traumatic stress disorder)    Restless leg syndrome     Family History  Problem Relation Age of Onset   Stroke Mother    Heart disease Father    Heart attack Father    Hypertension Father    Heart attack Brother    Emphysema Son    Hypertension Son    Emphysema Daughter     Past Surgical History:  Procedure Laterality Date   ABDOMINAL  HYSTERECTOMY     KNEE ARTHROSCOPY WITH MEDIAL MENISECTOMY Left 08/24/2013   Procedure: LEFT KNEE ARTHROSCOPY WITH MEDIAL MENISCECTOMY  ;  Surgeon: Nilda Simmer, MD;  Location: [facility] OR;  Service: Orthopedics;  Laterality: Left;   PARTIAL KNEE ARTHROPLASTY Left 08/24/2013   dr Denton Ar   PARTIAL KNEE ARTHROPLASTY Left 08/24/2013   Procedure: LEFT KNEE ARTHROPLASTY UNI COMPARTMENTAL ;  Surgeon: Nilda Simmer, MD;  Location: [facility] OR;  Service: Orthopedics;  Laterality: Left;  Left knee arthroplasty uni compartment  With left knee scope medial meniscectomy     TOTAL VAGINAL HYSTERECTOMY     Social History   Occupational History   Occupation: Retired  Tobacco Use   Smoking status: Former    Packs/day: 1.50    Years: 35.00    Pack years: 52.50    Types: Cigarettes    Quit date: 04/10/2003    Years since quitting: 17.7   Smokeless tobacco: Never  Vaping Use   Vaping Use: Never used  Substance and Sexual Activity   Alcohol use: No   Drug use: No   Sexual activity: Not on file

## 2020-12-29 ENCOUNTER — Telehealth: Payer: Self-pay | Admitting: Orthopedic Surgery

## 2020-12-29 NOTE — Telephone Encounter (Signed)
Ok on schedule any action required on my part?

## 2020-12-29 NOTE — Telephone Encounter (Signed)
FYI:   Patient is scheduled for Left Reverse Shoulder Arthroplasty 02-14-21 @ 7:30am

## 2021-01-05 ENCOUNTER — Ambulatory Visit (INDEPENDENT_AMBULATORY_CARE_PROVIDER_SITE_OTHER): Payer: Medicare HMO | Admitting: Internal Medicine

## 2021-01-14 NOTE — Progress Notes (Deleted)
Referring Provider: Wilson Singer, MD Primary Care Physician:  Wilson Singer, MD (Inactive) Primary Gastroenterologist:  Dr. Marletta Lor  No chief complaint on file.   HPI:   [name] [name] is a 75 y.o. female presenting today at the request of Wilson Singer, MD for colon cancer screening.  .  Recommended office visit due to medications.    Past Medical History:  Diagnosis Date   COPD (chronic obstructive pulmonary disease) (HCC)    Depression    Essential hypertension, benign 01/21/2019   History of pneumonia    2010 on ventalator   History of recurrent UTIs    Hypothyroidism    Insomnia 12/18/2018   Left knee DJD    Osteoporosis    PTSD (post-traumatic stress disorder)    Restless leg syndrome     Past Surgical History:  Procedure Laterality Date   ABDOMINAL HYSTERECTOMY     KNEE ARTHROSCOPY WITH MEDIAL MENISECTOMY Left 08/24/2013   Procedure: LEFT KNEE ARTHROSCOPY WITH MEDIAL MENISCECTOMY  ;  Surgeon: Nilda Simmer, MD;  Location: [facility] OR;  Service: Orthopedics;  Laterality: Left;   PARTIAL KNEE ARTHROPLASTY Left 08/24/2013   dr Denton Ar   PARTIAL KNEE ARTHROPLASTY Left 08/24/2013   Procedure: LEFT KNEE ARTHROPLASTY UNI COMPARTMENTAL ;  Surgeon: Nilda Simmer, MD;  Location: [facility] OR;  Service: Orthopedics;  Laterality: Left;  Left knee arthroplasty uni compartment  With left knee scope medial meniscectomy     TOTAL VAGINAL HYSTERECTOMY      Current Outpatient Medications  Medication Sig Dispense Refill   acetaminophen (TYLENOL) 325 MG tablet Take 2 tablets (650 mg total) by mouth every 6 (six) hours as needed for mild pain (or Fever >/= 101).     albuterol (PROVENTIL) (2.5 MG/3ML) 0.083% nebulizer solution INHALE THE CONTENTS OF 1 VIAL VIA NEBULIZER EVERY 6 HOURS AS NEEDED FOR WHEEZING OR SHORTNESS OF BREATH 180 mL 2   ALPRAZolam (XANAX) 0.5 MG tablet Take 1 tablet (0.5 mg total) by mouth at bedtime as needed for anxiety. 30 tablet 3   amitriptyline (ELAVIL) 50  MG tablet TAKE 1 TABLET EVERY DAY 90 tablet 0   amLODipine (NORVASC) 2.5 MG tablet TAKE 1 TABLET EVERY DAY 90 tablet 0   Cholecalciferol 125 MCG (5000 UT) TABS Take 1 tablet by mouth daily.     gabapentin (NEURONTIN) 300 MG capsule Take 2 capsules (600 mg total) by mouth at bedtime. 180 capsule 0   levothyroxine (SYNTHROID) 88 MCG tablet Take 1 tablet (88 mcg total) by mouth daily. 90 tablet 0   Naproxen Sodium (ALEVE PO) Take by mouth as needed.     No current facility-administered medications for this visit.    Allergies as of 01/16/2021 - Review Complete 12/25/2020  Allergen Reaction Noted   Codeine Itching 03/22/2016    Family History  Problem Relation Age of Onset   Stroke Mother    Heart disease Father    Heart attack Father    Hypertension Father    Heart attack Brother    Emphysema Son    Hypertension Son    Emphysema Daughter     Social History   Socioeconomic History   Marital status: Married    Spouse name: Not on file   Number of children: Not on file   Years of education: Not on file   Highest education level: High school graduate  Occupational History   Occupation: Retired  Tobacco Use   Smoking status: Former    Packs/day:  1.50    Years: 35.00    Pack years: 52.50    Types: Cigarettes    Quit date: 04/10/2003    Years since quitting: 17.7   Smokeless tobacco: Never  Vaping Use   Vaping Use: Never used  Substance and Sexual Activity   Alcohol use: No   Drug use: No   Sexual activity: Not on file  Other Topics Concern   Not on file  Social History Narrative   Married 22 years in 2nd marriage.First marriage lasted 20 years.Retired,now a Education officer, environmental.   Social Determinants of Health   Financial Resource Strain: Low Risk    Difficulty of Paying Living Expenses: Not hard at all  Food Insecurity: No Food Insecurity   Worried About Programme researcher, broadcasting/film/video in the Last Year: Never true   Ran Out of Food in the Last Year: Never true  Transportation Needs: No  Transportation Needs   Lack of Transportation (Medical): No   Lack of Transportation (Non-Medical): No  Physical Activity: Inactive   Days of Exercise per Week: 0 days   Minutes of Exercise per Session: 0 min  Stress: No Stress Concern Present   Feeling of Stress : Not at all  Social Connections: Moderately Isolated   Frequency of Communication with Friends and Family: More than three times a week   Frequency of Social Gatherings with Friends and Family: More than three times a week   Attends Religious Services: Never   Database administrator or Organizations: No   Attends Engineer, structural: Never   Marital Status: Married  Catering manager Violence: Not At Risk   Fear of Current or Ex-Partner: No   Emotionally Abused: No   Physically Abused: No   Sexually Abused: No    Review of Systems: Gen: Denies any fever, chills, fatigue, weight loss, lack of appetite.  CV: Denies chest pain, heart palpitations, peripheral edema, syncope.  Resp: Denies shortness of breath at rest or with exertion. Denies wheezing or cough.  GI: Denies dysphagia or odynophagia. Denies jaundice, hematemesis, fecal incontinence. GU : Denies urinary burning, urinary frequency, urinary hesitancy MS: Denies joint pain, muscle weakness, cramps, or limitation of movement.  Derm: Denies rash, itching, dry skin Psych: Denies depression, anxiety, memory loss, and confusion Heme: Denies bruising, bleeding, and enlarged lymph nodes.  Physical Exam: There were no vitals taken for this visit. General:   Alert and oriented. Pleasant and cooperative. Well-nourished and well-developed.  Head:  Normocephalic and atraumatic. Eyes:  Without icterus, sclera clear and conjunctiva pink.  Ears:  Normal auditory acuity. Nose:  No deformity, discharge,  or lesions. Mouth:  No deformity or lesions, oral mucosa pink.  Neck:  Supple, without mass or thyromegaly. Lungs:  Clear to auscultation bilaterally. No wheezes,  rales, or rhonchi. No distress.  Heart:  S1, S2 present without murmurs appreciated.  Abdomen:  +BS, soft, non-tender and non-distended. No HSM noted. No guarding or rebound. No masses appreciated.  Rectal:  Deferred  Msk:  Symmetrical without gross deformities. Normal posture. Pulses:  Normal pulses noted. Extremities:  Without clubbing or edema. Neurologic:  Alert and  oriented x4;  grossly normal neurologically. Skin:  Intact without significant lesions or rashes. Cervical Nodes:  No significant cervical adenopathy. Psych:  Alert and cooperative. Normal mood and affect.

## 2021-01-16 ENCOUNTER — Other Ambulatory Visit: Payer: Self-pay

## 2021-01-16 ENCOUNTER — Ambulatory Visit: Payer: Medicare HMO | Admitting: Gastroenterology

## 2021-01-17 ENCOUNTER — Other Ambulatory Visit (INDEPENDENT_AMBULATORY_CARE_PROVIDER_SITE_OTHER): Payer: Self-pay | Admitting: Nurse Practitioner

## 2021-01-17 DIAGNOSIS — G2581 Restless legs syndrome: Secondary | ICD-10-CM

## 2021-01-23 ENCOUNTER — Other Ambulatory Visit: Payer: Self-pay

## 2021-01-23 ENCOUNTER — Ambulatory Visit (INDEPENDENT_AMBULATORY_CARE_PROVIDER_SITE_OTHER): Payer: Medicare HMO | Admitting: Family Medicine

## 2021-01-23 VITALS — BP 152/75 | HR 78 | Temp 97.2°F | Ht 59.75 in | Wt 140.0 lb

## 2021-01-23 DIAGNOSIS — F99 Mental disorder, not otherwise specified: Secondary | ICD-10-CM | POA: Diagnosis not present

## 2021-01-23 DIAGNOSIS — F5105 Insomnia due to other mental disorder: Secondary | ICD-10-CM

## 2021-01-23 DIAGNOSIS — E039 Hypothyroidism, unspecified: Secondary | ICD-10-CM

## 2021-01-23 DIAGNOSIS — M810 Age-related osteoporosis without current pathological fracture: Secondary | ICD-10-CM | POA: Diagnosis not present

## 2021-01-23 DIAGNOSIS — Z23 Encounter for immunization: Secondary | ICD-10-CM

## 2021-01-23 DIAGNOSIS — G2581 Restless legs syndrome: Secondary | ICD-10-CM

## 2021-01-23 DIAGNOSIS — I10 Essential (primary) hypertension: Secondary | ICD-10-CM

## 2021-01-23 MED ORDER — AMLODIPINE BESYLATE 2.5 MG PO TABS: 2.5000 mg | ORAL_TABLET | Freq: Every day | ORAL | 3 refills | Status: DC

## 2021-01-23 MED ORDER — GABAPENTIN 300 MG PO CAPS: 600.0000 mg | ORAL_CAPSULE | Freq: Every day | ORAL | 0 refills | Status: DC

## 2021-01-23 MED ORDER — GABAPENTIN 300 MG PO CAPS: 600.0000 mg | ORAL_CAPSULE | Freq: Every day | ORAL | 3 refills | Status: DC

## 2021-01-23 MED ORDER — AMITRIPTYLINE HCL 50 MG PO TABS: 50.0000 mg | ORAL_TABLET | Freq: Every day | ORAL | 3 refills | Status: DC

## 2021-01-23 MED ORDER — ALPRAZOLAM 0.5 MG PO TABS: 0.5000 mg | ORAL_TABLET | Freq: Every evening | ORAL | 3 refills | Status: DC | PRN

## 2021-01-23 NOTE — Assessment & Plan Note (Signed)
Has been stable on gabapentin. Continue. Refilled today.

## 2021-01-23 NOTE — Progress Notes (Signed)
Subjective:  Patient ID: [name] [name], female    DOB: February 11, 1946  Age: 75 y.o. MRN: [id number]  CC: Chief Complaint  Patient presents with   restless legs    Rx gabapentin   New Patient (Initial Visit)    Has upcoming L shoulder surg Dr August Saucer ortho 02/14/21    HPI:  75 year old female presents as a new patient to establish care. Issues/concerns are below.   HTN Has been stable on Amlodipine. BP elevated today. Patient attributes this to pain. No reported LE edema from amlodipine.  Hyperlipidemia Uncontrolled. No pharmacotherapy at this time. Will discuss today.   Restless Leg Syndrome Has been on gabapentin for years.  States that she does well on this medication.  She is currently out of her medication and needs a refill today.  Anxiety and insomnia Patient states that she has been on Elavil for years.  She has tried to come off but did not tolerate.  Prefers to stay on this medication at this time. Patient taking Xanax for insomnia.  This was prescribed by previous provider.  She needs a refill for this as well.  She states that she is doing well at this time.  Hypothyroidism Last TSH was 6.89. Has been on Synthroid 88 MCG for quite some time.  Has not had a recent dose adjustment. Needs TSH today.  Osteoporosis Previous diagnosis.  No treatment. Patient unsure why.  Preventative health Wants flu shot today. Will be due for mammogram next month. Has appt scheduled with GI for colonoscopy.   Patient Active Problem List   Diagnosis Date Noted   Recurrent urinary tract infection 11/08/2020   Bilateral shoulder pain 11/08/2020   Age-related osteoporosis without current pathological fracture 07/22/2019   Upper airway cough syndrome 03/04/2019   DOE (dyspnea on exertion) 03/03/2019   Essential hypertension, benign 01/21/2019   Insomnia 12/18/2018   Vitamin D deficiency disease 12/18/2018   Pulmonary nodule 04/27/2016   Left knee DJD 08/24/2013   DJD  (degenerative joint disease) of knee 08/24/2013   Hypothyroidism    Restless leg syndrome    Depression    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      Social Hx   Social History   Socioeconomic History   Marital status: Married    Spouse name: Not on file   Number of children: Not on file   Years of education: Not on file   Highest education level: High school graduate  Occupational History   Occupation: Retired  Tobacco Use   Smoking status: Former    Packs/day: 1.50    Years: 35.00    Pack years: 52.50    Types: Cigarettes    Quit date: 04/10/2003    Years since quitting: 17.8   Smokeless tobacco: Never  Vaping Use   Vaping Use: Never used  Substance and Sexual Activity   Alcohol use: No   Drug use: No   Sexual activity: Not on file  Other Topics Concern   Not on file  Social History Narrative   Married 22 years in 2nd marriage.First marriage lasted 20 years.Retired,now a Education officer, environmental.   Social Determinants of Health   Financial Resource Strain: Low Risk    Difficulty of Paying Living Expenses: Not hard at all  Food Insecurity: No Food Insecurity   Worried About Programme researcher, broadcasting/film/video in the Last Year: Never true   Ran Out of Food in the Last Year: Never true  Transportation Needs: No Transportation Needs  Lack of Transportation (Medical): No   Lack of Transportation (Non-Medical): No  Physical Activity: Inactive   Days of Exercise per Week: 0 days   Minutes of Exercise per Session: 0 min  Stress: No Stress Concern Present   Feeling of Stress : Not at all  Social Connections: Moderately Isolated   Frequency of Communication with Friends and Family: More than three times a week   Frequency of Social Gatherings with Friends and Family: More than three times a week   Attends Religious Services: Never   Database administrator or Organizations: No   Attends Banker Meetings: Never   Marital Status: Married    Review of Systems  Constitutional: Negative.   Musculoskeletal:   Positive for arthralgias.    Objective:  BP (!) 152/75   Pulse 78   Temp (!) 97.2 F (36.2 C)   Ht 4' 11.75" (1.518 m)   Wt 140 lb (63.5 kg)   SpO2 99%   BMI 27.57 kg/m   BP/Weight 01/23/2021 11/08/2020 10/05/2020  Systolic BP 152 132 132  Diastolic BP 75 93 64  Wt. (Lbs) 140 141.6 138.8  BMI 27.57 27.89 27.11    Physical Exam Constitutional:      General: She is not in acute distress.    Appearance: Normal appearance. She is not ill-appearing.  HENT:     Head: Normocephalic and atraumatic.     Mouth/Throat:     Pharynx: Oropharynx is clear.  Eyes:     General:        Right eye: No discharge.        Left eye: No discharge.     Conjunctiva/sclera: Conjunctivae normal.  Cardiovascular:     Rate and Rhythm: Normal rate and regular rhythm.     Heart sounds: No murmur heard. Pulmonary:     Effort: Pulmonary effort is normal.     Breath sounds: Normal breath sounds. No wheezing, rhonchi or rales.  Abdominal:     General: There is no distension.     Palpations: Abdomen is soft.     Tenderness: There is no abdominal tenderness.  Neurological:     Mental Status: She is alert.  Psychiatric:        Mood and Affect: Mood normal.        Behavior: Behavior normal.    Lab Results  Component Value Date   WBC 11.4 (H) 10/05/2020   HGB 13.1 10/05/2020   HCT 41.1 10/05/2020   PLT 328 10/05/2020   GLUCOSE 75 10/05/2020   CHOL 181 10/05/2020   TRIG 149 10/05/2020   HDL 43 (L) 10/05/2020   LDLCALC 112 (H) 10/05/2020   ALT 23 10/05/2020   AST 23 10/05/2020   NA 139 10/05/2020   K 4.3 10/05/2020   CL 103 10/05/2020   CREATININE 0.92 10/05/2020   BUN 16 10/05/2020   CO2 27 10/05/2020   TSH 6.89 (H) 10/05/2020   INR 0.90 08/14/2013     Assessment & Plan:   Problem List Items Addressed This Visit       Cardiovascular and Mediastinum   Essential hypertension, benign - Primary    BP elevated today. Will continue current dose of amlodipine and have her check her  blood pressures at home.  May need an increase.      Relevant Medications   amLODipine (NORVASC) 2.5 MG tablet     Endocrine   Hypothyroidism    Has been on 88 MCG of Synthroid for quite  some time.  Last TSH was abnormal.  Repeating today and will adjust dose accordingly.      Relevant Orders   TSH + free T4     Musculoskeletal and Integument   Age-related osteoporosis without current pathological fracture    Untreated. Has upcoming shoulder surgery.  Discussed Bone Density findings today. Discussed treatment. Will wait to start until after upcoming surgery.         Other   Insomnia    Uses Xanax nightly. From previous provider. Will continue and discuss alternatives in the future.      Relevant Medications   ALPRAZolam (XANAX) 0.5 MG tablet   Restless leg syndrome    Has been stable on gabapentin. Continue. Refilled today.      Relevant Medications   gabapentin (NEURONTIN) 300 MG capsule   Other Visit Diagnoses     Immunization due       Relevant Orders   Flu Vaccine QUAD High Dose(Fluad) (Completed)       Meds ordered this encounter  Medications   DISCONTD: gabapentin (NEURONTIN) 300 MG capsule    Sig: Take 2 capsules (600 mg total) by mouth at bedtime.    Dispense:  30 capsule    Refill:  0   gabapentin (NEURONTIN) 300 MG capsule    Sig: Take 2 capsules (600 mg total) by mouth at bedtime.    Dispense:  180 capsule    Refill:  3   amLODipine (NORVASC) 2.5 MG tablet    Sig: Take 1 tablet (2.5 mg total) by mouth daily.    Dispense:  90 tablet    Refill:  3   amitriptyline (ELAVIL) 50 MG tablet    Sig: Take 1 tablet (50 mg total) by mouth daily.    Dispense:  90 tablet    Refill:  3   ALPRAZolam (XANAX) 0.5 MG tablet    Sig: Take 1 tablet (0.5 mg total) by mouth at bedtime as needed for anxiety.    Dispense:  30 tablet    Refill:  3    Follow-up:  Return in about 3 months (around 04/25/2021) for Follow up Chronic medical issues.  Everlene Other  DO [facility]

## 2021-01-23 NOTE — Patient Instructions (Signed)
Continue your medications as prescribed.  Check BP at home. Goal at least less than 140/90.  Lab on the way out.  Follow up in 3 months.  Take care  Dr. Adriana Simas

## 2021-01-23 NOTE — Assessment & Plan Note (Signed)
BP elevated today. Will continue current dose of amlodipine and have her check her blood pressures at home.  May need an increase.

## 2021-01-23 NOTE — Assessment & Plan Note (Signed)
Untreated. Has upcoming shoulder surgery.  Discussed Bone Density findings today. Discussed treatment. Will wait to start until after upcoming surgery.

## 2021-01-23 NOTE — Assessment & Plan Note (Signed)
Uses Xanax nightly. From previous provider. Will continue and discuss alternatives in the future.

## 2021-01-23 NOTE — Assessment & Plan Note (Signed)
Has been on 88 MCG of Synthroid for quite some time.  Last TSH was abnormal.  Repeating today and will adjust dose accordingly.

## 2021-01-24 ENCOUNTER — Telehealth: Payer: Self-pay | Admitting: Family Medicine

## 2021-01-24 LAB — TSH+FREE T4
Free T4: 1.48 ng/dL (ref 0.82–1.77)
TSH: 1.81 u[IU]/mL (ref 0.450–4.500)

## 2021-01-24 NOTE — Telephone Encounter (Signed)
Pt has lab result in result notes. Left message to return call if question regarding lab results.

## 2021-01-24 NOTE — Telephone Encounter (Signed)
Patient stated she received a call from our number this morning. Did not see any documentation that someone called. Please call if needed.  CB#  [phone]

## 2021-02-09 NOTE — Pre-Procedure Instructions (Signed)
Surgical Instructions    Your procedure is scheduled on Tuesday, February 14, 2021 at 7:30 AM.  Report to [facility] Main Entrance "A" at 5:30 A.M., then check in with the Admitting office.  Call this number if you have problems the morning of surgery:  [phone]   If you have any questions prior to your surgery date call [phone]: Open Monday-Friday 8am-4pm    Remember:  Do not eat after midnight the night before your surgery  You may drink clear liquids until 4:30 AM the morning of your surgery.   Clear liquids allowed are: Water, Non-Citrus Juices (without pulp), Carbonated Beverages, Clear Tea, Black Coffee Only, and Gatorade   Enhanced Recovery after Surgery for Orthopedics Enhanced Recovery after Surgery is a protocol used to improve the stress on your body and your recovery after surgery.  Patient Instructions  The day of surgery (if you do NOT have diabetes):  Drink ONE (1) Pre-Surgery Clear Ensure by 4:30 am the morning of surgery   This drink was given to you during your hospital  pre-op appointment visit. Nothing else to drink after completing the  Pre-Surgery Clear Ensure.         If you have questions, please contact your surgeon's office.     Take these medicines the morning of surgery with A SIP OF WATER:  IF NEEDED: albuterol (PROVENTIL) Carboxymethylcellul-Glycerin (LUBRICATING EYE DROPS OP)  As of today, STOP taking any Aspirin (unless otherwise instructed by your surgeon) naproxen sodium (ALEVE), Ibuprofen, Motrin, Advil, Goody's, BC's, all herbal medications, fish oil, and all vitamins.                     Do NOT Smoke (Tobacco/Vaping) or drink Alcohol 24 hours prior to your procedure.  If you use a CPAP at night, you may bring all equipment for your overnight stay.   Contacts, glasses, piercing's, hearing aid's, dentures or partials may not be worn into surgery, please bring cases for these belongings.    For patients admitted to the  hospital, discharge time will be determined by your treatment team.   Patients discharged the day of surgery will not be allowed to drive home, and someone needs to stay with them for 24 hours.  NO VISITORS WILL BE ALLOWED IN PRE-OP WHERE PATIENTS GET READY FOR SURGERY.  ONLY 1 SUPPORT PERSON MAY BE PRESENT IN THE WAITING ROOM WHILE YOU ARE IN SURGERY.  IF YOU ARE TO BE ADMITTED, ONCE YOU ARE IN YOUR ROOM YOU WILL BE ALLOWED TWO (2) VISITORS.  Minor children may have two parents present. Special consideration for safety and communication needs will be reviewed on a case by case basis.                                   [place]- Preparing for Total Shoulder Arthroplasty   Before surgery, you can play an important role. Because skin is not sterile, your skin needs to be as free of germs as possible. You can reduce the number of germs on your skin by using the following products. Benzoyl Peroxide Gel Reduces the number of germs present on the skin Applied twice a day to shoulder area starting two days before surgery   Chlorhexidine Gluconate (CHG) Soap An antiseptic cleaner that kills germs and bonds with the skin to continue killing germs even after washing Used for showering the night before surgery and morning  of surgery   Oral Hygiene is also important to reduce your risk of infection.                                    Remember - BRUSH YOUR TEETH THE MORNING OF SURGERY WITH YOUR REGULAR TOOTHPASTE  ==================================================================  Please follow these instructions carefully:  BENZOYL PEROXIDE 5% GEL  Please do not use if you have an allergy to benzoyl peroxide.   If your skin becomes reddened/irritated stop using the benzoyl peroxide.  Starting two days before surgery, apply as follows: Apply benzoyl peroxide in the morning and at night. Apply after taking a shower. If you are not taking a shower clean entire shoulder front, back, and side along with  the armpit with a clean wet washcloth.  Place a quarter-sized dollop on your shoulder and rub in thoroughly, making sure to cover the front, back, and side of your shoulder, along with the armpit.   2 days before ____ AM   ____ PM              1 day before ____ AM   ____ PM                             Do this twice a day for two days.  (Last application is the night before surgery, AFTER using the CHG soap as described below).  Do NOT apply benzoyl peroxide gel on the day of surgery.  CHLORHEXIDINE GLUCONATE (CHG) SOAP  Please do not use if you have an allergy to CHG or antibacterial soaps. If your skin becomes reddened/irritated stop using the CHG.   Do not shave (including legs and underarms) for at least 48 hours prior to first CHG shower. It is OK to shave your face.  Starting the night before surgery, use CHG soap as follows:  Shower the NIGHT BEFORE SURGERY and MORNING OF SURGERY with CHG.  If you choose to wash your hair, wash your hair first as usual with your normal shampoo.  After shampooing, rinse your hair and body thoroughly to remove the shampoo.  Use CHG as you would any other liquid soap.  You can apply CHG directly to the skin and wash gently with a scrungie or a clean washcloth.  Apply the CHG soap to your body ONLY FROM THE NECK DOWN.  Do not use on open wounds or open sores.  Avoid contact with your eyes, ears, mouth, and genitals (private parts).  Wash face and genitals (private parts) with your normal soap.  Wash thoroughly, paying special attention to the area where your surgery will be performed.  Thoroughly rinse your body with warm water from the neck down.  DO NOT shower/wash with your normal soap after using and rinsing off the CHG soap.   Pat yourself dry with a CLEAN TOWEL.    Apply benzoyl peroxide.   Wear CLEAN PAJAMAS to bed the night before surgery; wear comfortable clothes the morning of surgery.  Place CLEAN SHEETS on your bed the night  of your first shower and DO NOT SLEEP WITH PETS.   Day of Surgery: Shower with CHG soap. Do not wear jewelry, make up, nail polish, gel polish, artificial nails, or any other type of covering on natural nails including finger and toenails. If patients have artificial nails, gel coating, etc. that need to be removed  by a nail salon please have this removed prior to surgery. Surgery may need to be canceled/delayed if the surgeon/ anesthesia feels like the patient is unable to be adequately monitored. Do not wear lotions, powders, perfumes, or deodorant. Do not shave 48 hours prior to surgery. Do not bring valuables to the hospital. [facility] is not responsible for any belongings or valuables. Wear Clean/Comfortable clothing the morning of surgery Remember to brush your teeth WITH YOUR REGULAR TOOTHPASTE.   Please read over the following fact sheets that you were given.   3 days prior to your procedure or After your COVID test   You are not required to quarantine however you are required to wear a well-fitting mask when you are out and around people not in your household. If your mask becomes wet or soiled, replace with a new one.   Wash your hands often with soap and water for 20 seconds or clean your hands with an alcohol-based hand sanitizer that contains at least 60% alcohol.   Do not share personal items.   Notify your provider:  o if you are in close contact with someone who has COVID  o or if you develop a fever of 100.4 or greater, sneezing, cough, sore throat, shortness of breath or body aches.

## 2021-02-10 ENCOUNTER — Encounter (HOSPITAL_COMMUNITY): Payer: Self-pay

## 2021-02-10 ENCOUNTER — Other Ambulatory Visit: Payer: Self-pay

## 2021-02-10 ENCOUNTER — Encounter (HOSPITAL_COMMUNITY)
Admission: RE | Admit: 2021-02-10 | Discharge: 2021-02-10 | Disposition: A | Discharge status: Home / Self Care | Payer: Medicare HMO | Source: Ambulatory Visit | Attending: Orthopedic Surgery | Admitting: Orthopedic Surgery

## 2021-02-10 VITALS — BP 146/63 | HR 74 | Temp 97.6°F | Resp 17 | Ht 59.0 in | Wt 140.1 lb

## 2021-02-10 DIAGNOSIS — Z20822 Contact with and (suspected) exposure to covid-19: Secondary | ICD-10-CM | POA: Insufficient documentation

## 2021-02-10 DIAGNOSIS — Z01818 Encounter for other preprocedural examination: Secondary | ICD-10-CM | POA: Insufficient documentation

## 2021-02-10 HISTORY — DX: Sleep apnea, unspecified: G47.30

## 2021-02-10 HISTORY — DX: Dyspnea, unspecified: R06.00

## 2021-02-10 LAB — BASIC METABOLIC PANEL
Anion gap: 8 (ref 5–15)
BUN: 17 mg/dL (ref 8–23)
CO2: 23 mmol/L (ref 22–32)
Calcium: 9.7 mg/dL (ref 8.9–10.3)
Chloride: 107 mmol/L (ref 98–111)
Creatinine, Ser: 0.88 mg/dL (ref 0.44–1.00)
GFR, Estimated: >60 mL/min (ref >60)
Glucose, Bld: 78 mg/dL (ref 70–99)
Potassium: 4.1 mmol/L (ref 3.5–5.1)
Sodium: 138 mmol/L (ref 135–145)

## 2021-02-10 LAB — CBC
HCT: 45.2 % (ref 36.0–46.0)
Hemoglobin: 13.5 g/dL (ref 12.0–15.0)
MCH: 26.2 pg (ref 26.0–34.0)
MCHC: 29.9 g/dL — ABNORMAL LOW (ref 30.0–36.0)
MCV: 87.8 fL (ref 80.0–100.0)
Platelets: 316 10*3/uL (ref 150–400)
RBC: 5.15 MIL/uL — ABNORMAL HIGH (ref 3.87–5.11)
RDW: 14.3 % (ref 11.5–15.5)
WBC: 8.4 10*3/uL (ref 4.0–10.5)
nRBC: 0 % (ref 0.0–0.2)

## 2021-02-10 LAB — URINALYSIS, ROUTINE W REFLEX MICROSCOPIC
Bacteria, UA: NONE SEEN
Bilirubin Urine: NEGATIVE
Glucose, UA: NEGATIVE mg/dL
Ketones, ur: NEGATIVE mg/dL
Nitrite: NEGATIVE
Protein, ur: NEGATIVE mg/dL
Specific Gravity, Urine: 1.012 (ref 1.005–1.030)
pH: 5 (ref 5.0–8.0)

## 2021-02-10 LAB — SURGICAL PCR SCREEN
MRSA, PCR: POSITIVE — AB
Staphylococcus aureus: POSITIVE — AB

## 2021-02-10 LAB — SARS CORONAVIRUS 2 (TAT 6-24 HRS): SARS Coronavirus 2: NEGATIVE

## 2021-02-10 NOTE — Progress Notes (Signed)
PCP - Dr. Everlene Other Cardiologist - Denies  PPM/ICD - Denies Device Orders -  Rep Notified -   Chest x-ray - N/A EKG - 02/10/21 Stress Test - 07/12/16 ECHO - 06/07/16 Cardiac Cath - Denies   Sleep Study - Yes CPAP - No  Patient denies having diabetes.  Blood Thinner Instructions: N/A Aspirin Instructions: N/A  ERAS Protcol - Yes, PRE-SURGERY Ensure  COVID TEST- 02/10/21 Done in PAT   Anesthesia review: Yes, abnormal labs (UA)  Patient denies shortness of breath, fever, cough and chest pain at PAT appointment   All instructions explained to the patient, with a verbal understanding of the material. Patient agrees to go over the instructions while at home for a better understanding. Patient also instructed to self quarantine after being tested for COVID-19. The opportunity to ask questions was provided.

## 2021-02-11 LAB — URINE CULTURE: Culture: 50000 — AB

## 2021-02-12 ENCOUNTER — Other Ambulatory Visit: Payer: Self-pay | Admitting: Surgical

## 2021-02-12 MED ORDER — AMOXICILLIN 500 MG PO TABS: 500.0000 mg | ORAL_TABLET | Freq: Three times a day (TID) | ORAL | 0 refills | Status: DC

## 2021-02-13 ENCOUNTER — Encounter (HOSPITAL_COMMUNITY): Payer: Self-pay | Admitting: Orthopedic Surgery

## 2021-02-13 ENCOUNTER — Telehealth: Payer: Self-pay | Admitting: Orthopedic Surgery

## 2021-02-13 NOTE — Telephone Encounter (Signed)
Pts husband Kipp Brood called stating she's having surgery in the morning and they had a couple questions. They would like a CB from [facility] or Dr.Dean.   [phone]

## 2021-02-13 NOTE — Telephone Encounter (Signed)
I called, no worries

## 2021-02-14 ENCOUNTER — Ambulatory Visit (HOSPITAL_COMMUNITY): Payer: Medicare HMO | Admitting: Physician Assistant

## 2021-02-14 ENCOUNTER — Other Ambulatory Visit: Payer: Self-pay

## 2021-02-14 ENCOUNTER — Ambulatory Visit (HOSPITAL_COMMUNITY): Payer: Medicare HMO | Admitting: Anesthesiology

## 2021-02-14 ENCOUNTER — Encounter (HOSPITAL_COMMUNITY): Payer: Self-pay | Admitting: Orthopedic Surgery

## 2021-02-14 ENCOUNTER — Observation Stay (HOSPITAL_COMMUNITY)
Admission: RE | Admit: 2021-02-14 | Discharge: 2021-02-15 | Disposition: A | Discharge status: Home / Self Care | Payer: Medicare HMO | Source: Ambulatory Visit | Attending: Orthopedic Surgery | Admitting: Orthopedic Surgery

## 2021-02-14 ENCOUNTER — Observation Stay (HOSPITAL_COMMUNITY): Payer: Medicare HMO

## 2021-02-14 ENCOUNTER — Encounter (HOSPITAL_COMMUNITY)
Admission: RE | Disposition: A | Discharge status: Home / Self Care | Payer: Self-pay | Source: Ambulatory Visit | Attending: Orthopedic Surgery

## 2021-02-14 DIAGNOSIS — Z96652 Presence of left artificial knee joint: Secondary | ICD-10-CM | POA: Insufficient documentation

## 2021-02-14 DIAGNOSIS — G8918 Other acute postprocedural pain: Secondary | ICD-10-CM | POA: Diagnosis not present

## 2021-02-14 DIAGNOSIS — M19012 Primary osteoarthritis, left shoulder: Principal | ICD-10-CM | POA: Insufficient documentation

## 2021-02-14 DIAGNOSIS — Z01818 Encounter for other preprocedural examination: Secondary | ICD-10-CM

## 2021-02-14 DIAGNOSIS — Z96612 Presence of left artificial shoulder joint: Secondary | ICD-10-CM | POA: Diagnosis not present

## 2021-02-14 DIAGNOSIS — Z87891 Personal history of nicotine dependence: Secondary | ICD-10-CM | POA: Insufficient documentation

## 2021-02-14 DIAGNOSIS — Z471 Aftercare following joint replacement surgery: Secondary | ICD-10-CM | POA: Diagnosis not present

## 2021-02-14 DIAGNOSIS — Z96642 Presence of left artificial hip joint: Secondary | ICD-10-CM | POA: Diagnosis not present

## 2021-02-14 DIAGNOSIS — E559 Vitamin D deficiency, unspecified: Secondary | ICD-10-CM | POA: Diagnosis not present

## 2021-02-14 DIAGNOSIS — J449 Chronic obstructive pulmonary disease, unspecified: Secondary | ICD-10-CM | POA: Insufficient documentation

## 2021-02-14 DIAGNOSIS — Z79899 Other long term (current) drug therapy: Secondary | ICD-10-CM | POA: Insufficient documentation

## 2021-02-14 DIAGNOSIS — I10 Essential (primary) hypertension: Secondary | ICD-10-CM | POA: Diagnosis not present

## 2021-02-14 DIAGNOSIS — Z9889 Other specified postprocedural states: Secondary | ICD-10-CM

## 2021-02-14 HISTORY — PX: REVERSE SHOULDER ARTHROPLASTY: SHX5054

## 2021-02-14 SURGERY — ARTHROPLASTY, SHOULDER, TOTAL, REVERSE
Anesthesia: General | Site: Shoulder | Laterality: Left

## 2021-02-14 MED ORDER — LACTATED RINGERS IV SOLN: INTRAVENOUS | Status: DC

## 2021-02-14 MED ORDER — POVIDONE-IODINE 10 % EX SWAB
2.0000 "application " | Freq: Once | CUTANEOUS | Status: AC
  Administered 2021-02-14: 2 via TOPICAL

## 2021-02-14 MED ORDER — HYDROCODONE-ACETAMINOPHEN 5-325 MG PO TABS
1.0000 | ORAL_TABLET | ORAL | Status: DC | PRN
  Administered 2021-02-14 – 2021-02-15 (×3): 2 via ORAL
  Filled 2021-02-14 (×3): qty 2

## 2021-02-14 MED ORDER — METHOCARBAMOL 500 MG PO TABS
500.0000 mg | ORAL_TABLET | Freq: Four times a day (QID) | ORAL | Status: DC | PRN
  Administered 2021-02-14: 500 mg via ORAL
  Filled 2021-02-14: qty 1

## 2021-02-14 MED ORDER — METHOCARBAMOL 1000 MG/10ML IJ SOLN: 500.0000 mg | Freq: Four times a day (QID) | INTRAVENOUS | Status: DC | PRN

## 2021-02-14 MED ORDER — DEXAMETHASONE SODIUM PHOSPHATE 10 MG/ML IJ SOLN
INTRAMUSCULAR | Status: AC
  Filled 2021-02-14: qty 1

## 2021-02-14 MED ORDER — MIDAZOLAM HCL 2 MG/2ML IJ SOLN
INTRAMUSCULAR | Status: AC
  Filled 2021-02-14: qty 2

## 2021-02-14 MED ORDER — ROCURONIUM BROMIDE 10 MG/ML (PF) SYRINGE
PREFILLED_SYRINGE | INTRAVENOUS | Status: DC | PRN
Start: 2021-02-14 — End: 2021-02-14
  Administered 2021-02-14: 50 mg via INTRAVENOUS

## 2021-02-14 MED ORDER — ONDANSETRON HCL 4 MG/2ML IJ SOLN
INTRAMUSCULAR | Status: DC | PRN
  Administered 2021-02-14: 4 mg via INTRAVENOUS

## 2021-02-14 MED ORDER — 0.9 % SODIUM CHLORIDE (POUR BTL) OPTIME
TOPICAL | Status: DC | PRN
  Administered 2021-02-14: 5000 mL

## 2021-02-14 MED ORDER — GABAPENTIN 300 MG PO CAPS
600.0000 mg | ORAL_CAPSULE | Freq: Every day | ORAL | Status: DC
  Administered 2021-02-14: 600 mg via ORAL
  Filled 2021-02-14: qty 2

## 2021-02-14 MED ORDER — POVIDONE-IODINE 7.5 % EX SOLN
Freq: Once | CUTANEOUS | Status: DC
  Filled 2021-02-14: qty 118

## 2021-02-14 MED ORDER — METOCLOPRAMIDE HCL 5 MG/ML IJ SOLN
5.0000 mg | Freq: Three times a day (TID) | INTRAMUSCULAR | Status: DC | PRN
  Administered 2021-02-15: 10 mg via INTRAVENOUS
  Filled 2021-02-14: qty 2

## 2021-02-14 MED ORDER — AMITRIPTYLINE HCL 50 MG PO TABS
50.0000 mg | ORAL_TABLET | Freq: Every day | ORAL | Status: DC
  Administered 2021-02-14: 50 mg via ORAL
  Filled 2021-02-14 (×3): qty 1

## 2021-02-14 MED ORDER — VANCOMYCIN HCL 1000 MG IV SOLR
INTRAVENOUS | Status: DC | PRN
  Administered 2021-02-14: 1000 mg

## 2021-02-14 MED ORDER — PHENOL 1.4 % MT LIQD: 1.0000 | OROMUCOSAL | Status: DC | PRN

## 2021-02-14 MED ORDER — ASPIRIN EC 81 MG PO TBEC
81.0000 mg | DELAYED_RELEASE_TABLET | Freq: Every day | ORAL | Status: DC
  Administered 2021-02-14 – 2021-02-15 (×2): 81 mg via ORAL
  Filled 2021-02-14 (×2): qty 1

## 2021-02-14 MED ORDER — MIDAZOLAM HCL 5 MG/5ML IJ SOLN
INTRAMUSCULAR | Status: DC | PRN
  Administered 2021-02-14: 1 mg via INTRAVENOUS

## 2021-02-14 MED ORDER — PROPOFOL 10 MG/ML IV BOLUS
INTRAVENOUS | Status: DC | PRN
  Administered 2021-02-14: 120 mg via INTRAVENOUS

## 2021-02-14 MED ORDER — AMLODIPINE BESYLATE 2.5 MG PO TABS
2.5000 mg | ORAL_TABLET | Freq: Every day | ORAL | Status: DC
  Administered 2021-02-14: 2.5 mg via ORAL
  Filled 2021-02-14 (×2): qty 1

## 2021-02-14 MED ORDER — VANCOMYCIN HCL 1000 MG IV SOLR
INTRAVENOUS | Status: AC
  Filled 2021-02-14: qty 20

## 2021-02-14 MED ORDER — DOCUSATE SODIUM 100 MG PO CAPS
100.0000 mg | ORAL_CAPSULE | Freq: Two times a day (BID) | ORAL | Status: DC
  Administered 2021-02-14 – 2021-02-15 (×3): 100 mg via ORAL
  Filled 2021-02-14 (×3): qty 1

## 2021-02-14 MED ORDER — METOCLOPRAMIDE HCL 5 MG PO TABS
5.0000 mg | ORAL_TABLET | Freq: Three times a day (TID) | ORAL | Status: DC | PRN
  Filled 2021-02-14: qty 2

## 2021-02-14 MED ORDER — VANCOMYCIN HCL IN DEXTROSE 1-5 GM/200ML-% IV SOLN
1000.0000 mg | INTRAVENOUS | Status: AC
  Administered 2021-02-14: 1000 mg via INTRAVENOUS
  Filled 2021-02-14: qty 200

## 2021-02-14 MED ORDER — MUPIROCIN 2 % EX OINT
1.0000 "application " | TOPICAL_OINTMENT | Freq: Two times a day (BID) | CUTANEOUS | Status: DC
  Administered 2021-02-14 – 2021-02-15 (×2): 1 via NASAL
  Filled 2021-02-14: qty 22

## 2021-02-14 MED ORDER — FENTANYL CITRATE (PF) 250 MCG/5ML IJ SOLN
INTRAMUSCULAR | Status: DC | PRN
  Administered 2021-02-14: 50 ug via INTRAVENOUS

## 2021-02-14 MED ORDER — FENTANYL CITRATE (PF) 250 MCG/5ML IJ SOLN
INTRAMUSCULAR | Status: AC
  Filled 2021-02-14: qty 5

## 2021-02-14 MED ORDER — PHENYLEPHRINE HCL-NACL 20-0.9 MG/250ML-% IV SOLN
INTRAVENOUS | Status: DC | PRN
  Administered 2021-02-14: 50 ug/min via INTRAVENOUS

## 2021-02-14 MED ORDER — ACETAMINOPHEN 325 MG PO TABS: 325.0000 mg | ORAL_TABLET | Freq: Four times a day (QID) | ORAL | Status: DC | PRN

## 2021-02-14 MED ORDER — SUGAMMADEX SODIUM 200 MG/2ML IV SOLN
INTRAVENOUS | Status: DC | PRN
  Administered 2021-02-14: 150 mg via INTRAVENOUS

## 2021-02-14 MED ORDER — DIPHENHYDRAMINE HCL 12.5 MG/5ML PO ELIX: 12.5000 mg | ORAL_SOLUTION | ORAL | Status: DC | PRN

## 2021-02-14 MED ORDER — ONDANSETRON HCL 4 MG/2ML IJ SOLN
INTRAMUSCULAR | Status: AC
  Filled 2021-02-14: qty 2

## 2021-02-14 MED ORDER — TRANEXAMIC ACID-NACL 1000-0.7 MG/100ML-% IV SOLN
1000.0000 mg | INTRAVENOUS | Status: AC
  Administered 2021-02-14: 1000 mg via INTRAVENOUS
  Filled 2021-02-14: qty 100

## 2021-02-14 MED ORDER — LIDOCAINE 2% (20 MG/ML) 5 ML SYRINGE
INTRAMUSCULAR | Status: DC | PRN
  Administered 2021-02-14: 50 mg via INTRAVENOUS

## 2021-02-14 MED ORDER — ONDANSETRON HCL 4 MG PO TABS: 4.0000 mg | ORAL_TABLET | Freq: Four times a day (QID) | ORAL | Status: DC | PRN

## 2021-02-14 MED ORDER — DEXAMETHASONE SODIUM PHOSPHATE 10 MG/ML IJ SOLN
INTRAMUSCULAR | Status: DC | PRN
Start: 2021-02-14 — End: 2021-02-14
  Administered 2021-02-14: 8 mg via INTRAVENOUS

## 2021-02-14 MED ORDER — CHLORHEXIDINE GLUCONATE 0.12 % MT SOLN
15.0000 mL | Freq: Once | OROMUCOSAL | Status: AC
  Administered 2021-02-14: 15 mL via OROMUCOSAL
  Filled 2021-02-14: qty 15

## 2021-02-14 MED ORDER — PROPOFOL 10 MG/ML IV BOLUS
INTRAVENOUS | Status: AC
  Filled 2021-02-14: qty 20

## 2021-02-14 MED ORDER — CEFAZOLIN SODIUM-DEXTROSE 2-4 GM/100ML-% IV SOLN
2.0000 g | INTRAVENOUS | Status: AC
  Administered 2021-02-14: 2 g via INTRAVENOUS
  Filled 2021-02-14: qty 100

## 2021-02-14 MED ORDER — BUPIVACAINE LIPOSOME 1.3 % IJ SUSP
INTRAMUSCULAR | Status: DC | PRN
  Administered 2021-02-14: 10 mL via PERINEURAL

## 2021-02-14 MED ORDER — MORPHINE SULFATE (PF) 2 MG/ML IV SOLN: 0.5000 mg | INTRAVENOUS | Status: DC | PRN

## 2021-02-14 MED ORDER — MENTHOL 3 MG MT LOZG: 1.0000 | LOZENGE | OROMUCOSAL | Status: DC | PRN

## 2021-02-14 MED ORDER — IRRISEPT - 450ML BOTTLE WITH 0.05% CHG IN STERILE WATER, USP 99.95% OPTIME
TOPICAL | Status: DC | PRN
  Administered 2021-02-14: 450 mL

## 2021-02-14 MED ORDER — ORAL CARE MOUTH RINSE: 15.0000 mL | Freq: Once | OROMUCOSAL | Status: AC

## 2021-02-14 MED ORDER — BUPIVACAINE HCL (PF) 0.5 % IJ SOLN
INTRAMUSCULAR | Status: DC | PRN
  Administered 2021-02-14: 10 mL via PERINEURAL

## 2021-02-14 MED ORDER — LIDOCAINE 2% (20 MG/ML) 5 ML SYRINGE
INTRAMUSCULAR | Status: AC
  Filled 2021-02-14: qty 5

## 2021-02-14 MED ORDER — CEFAZOLIN SODIUM-DEXTROSE 2-4 GM/100ML-% IV SOLN
2.0000 g | Freq: Three times a day (TID) | INTRAVENOUS | Status: AC
  Administered 2021-02-14 – 2021-02-15 (×3): 2 g via INTRAVENOUS
  Filled 2021-02-14 (×3): qty 100

## 2021-02-14 MED ORDER — ONDANSETRON HCL 4 MG/2ML IJ SOLN
4.0000 mg | Freq: Four times a day (QID) | INTRAMUSCULAR | Status: DC | PRN
  Administered 2021-02-14: 4 mg via INTRAVENOUS
  Filled 2021-02-14: qty 2

## 2021-02-14 MED ORDER — ACETAMINOPHEN 500 MG PO TABS
500.0000 mg | ORAL_TABLET | Freq: Four times a day (QID) | ORAL | Status: AC
  Administered 2021-02-14: 500 mg via ORAL
  Filled 2021-02-14: qty 1

## 2021-02-14 MED ORDER — LEVOTHYROXINE SODIUM 88 MCG PO TABS
88.0000 ug | ORAL_TABLET | Freq: Every day | ORAL | Status: DC
  Administered 2021-02-14: 88 ug via ORAL
  Filled 2021-02-14 (×2): qty 1

## 2021-02-14 SURGICAL SUPPLY — 85 items
AID PSTN UNV HD RSTRNT DISP (MISCELLANEOUS) ×1
ALCOHOL 70% 16 OZ (MISCELLANEOUS) ×2 IMPLANT
APL PRP STRL LF DISP 70% ISPRP (MISCELLANEOUS) ×1
BAG COUNTER SPONGE SURGICOUNT (BAG) ×2 IMPLANT
BAG SPNG CNTER NS LX DISP (BAG) ×1
BASEPLATE GLENOSPHERE 25 (Plate) ×1 IMPLANT
BIT DRILL 2.7 W/STOP DISP (BIT) ×1 IMPLANT
BIT DRILL TWIST 2.7 (BIT) ×1 IMPLANT
BLADE SAW SGTL 13X75X1.27 (BLADE) ×2 IMPLANT
BRNG HUM +3 36 RVRS SHLDR (Shoulder) ×1 IMPLANT
CHLORAPREP W/TINT 26 (MISCELLANEOUS) ×2 IMPLANT
COOLER ICEMAN CLASSIC (MISCELLANEOUS) ×2 IMPLANT
COVER SURGICAL LIGHT HANDLE (MISCELLANEOUS) ×2 IMPLANT
DRAPE INCISE IOBAN 66X45 STRL (DRAPES) ×2 IMPLANT
DRAPE U-SHAPE 47X51 STRL (DRAPES) ×4 IMPLANT
DRSG AQUACEL AG ADV 3.5X10 (GAUZE/BANDAGES/DRESSINGS) ×2 IMPLANT
ELECT BLADE 4.0 EZ CLEAN MEGAD (MISCELLANEOUS) ×2
ELECT REM PT RETURN 9FT ADLT (ELECTROSURGICAL) ×2
ELECTRODE BLDE 4.0 EZ CLN MEGD (MISCELLANEOUS) ×1 IMPLANT
ELECTRODE REM PT RTRN 9FT ADLT (ELECTROSURGICAL) ×1 IMPLANT
GAUZE SPONGE 4X4 12PLY STRL LF (GAUZE/BANDAGES/DRESSINGS) ×2 IMPLANT
GLENOID SPHERE 36MM CVD +3 (Orthopedic Implant) ×1 IMPLANT
GLOVE SRG 8 PF TXTR STRL LF DI (GLOVE) ×1 IMPLANT
GLOVE SURG LTX SZ7 (GLOVE) ×2 IMPLANT
GLOVE SURG LTX SZ8 (GLOVE) ×2 IMPLANT
GLOVE SURG UNDER POLY LF SZ7 (GLOVE) ×2 IMPLANT
GLOVE SURG UNDER POLY LF SZ8 (GLOVE) ×2
GOWN STRL REUS W/ TWL LRG LVL3 (GOWN DISPOSABLE) ×2 IMPLANT
GOWN STRL REUS W/ TWL XL LVL3 (GOWN DISPOSABLE) IMPLANT
GOWN STRL REUS W/TWL LRG LVL3 (GOWN DISPOSABLE) ×4
GOWN STRL REUS W/TWL XL LVL3 (GOWN DISPOSABLE)
GUIDE MODEL REV SHLD LT (ORTHOPEDIC DISPOSABLE SUPPLIES) ×1 IMPLANT
HYDROGEN PEROXIDE 16OZ (MISCELLANEOUS) ×2 IMPLANT
JET LAVAGE IRRISEPT WOUND (IRRIGATION / IRRIGATOR) ×2
KIT BASIN OR (CUSTOM PROCEDURE TRAY) ×2 IMPLANT
KIT TURNOVER KIT B (KITS) ×2 IMPLANT
LAVAGE JET IRRISEPT WOUND (IRRIGATION / IRRIGATOR) ×1 IMPLANT
LOOP VESSEL MAXI BLUE (MISCELLANEOUS) ×2 IMPLANT
MANIFOLD NEPTUNE II (INSTRUMENTS) ×2 IMPLANT
NDL SUT 6 .5 CRC .975X.05 MAYO (NEEDLE) IMPLANT
NDL TAPERED W/ NITINOL LOOP (MISCELLANEOUS) ×1 IMPLANT
NEEDLE MAYO TAPER (NEEDLE)
NEEDLE TAPERED W/ NITINOL LOOP (MISCELLANEOUS) ×2 IMPLANT
NS IRRIG 1000ML POUR BTL (IV SOLUTION) ×2 IMPLANT
PACK SHOULDER (CUSTOM PROCEDURE TRAY) ×2 IMPLANT
PAD ARMBOARD 7.5X6 YLW CONV (MISCELLANEOUS) ×4 IMPLANT
PAD COLD SHLDR WRAP-ON (PAD) ×2 IMPLANT
PASSER SUT SWANSON 36MM LOOP (INSTRUMENTS) ×2 IMPLANT
PIN STEINMANN THREADED TIP (PIN) ×1 IMPLANT
PIN THREADED REVERSE (PIN) ×2 IMPLANT
REAMER GUIDE BUSHING SURG DISP (MISCELLANEOUS) ×1 IMPLANT
REAMER GUIDE W/SCREW AUG (MISCELLANEOUS) ×1 IMPLANT
RESTRAINT HEAD UNIVERSAL NS (MISCELLANEOUS) ×2 IMPLANT
RETRIEVER SUT HEWSON (MISCELLANEOUS) ×1 IMPLANT
SCREW CENTRAL 6.5X20MM (Screw) ×1 IMPLANT
SCREW LOCKING 4.75MMX15MM (Screw) ×2 IMPLANT
SCREW LOCKING NS 4.75MMX20MM (Screw) ×1 IMPLANT
SCREW LOCKING STRL 4.75X25X3.5 (Screw) ×1 IMPLANT
SLING ARM IMMOBILIZER LRG (SOFTGOODS) ×2 IMPLANT
SOL PREP POV-IOD 4OZ 10% (MISCELLANEOUS) ×2 IMPLANT
SPONGE T-LAP 18X18 ~~LOC~~+RFID (SPONGE) ×2 IMPLANT
STEM HUMERAL STRL 9MMX83MM (Stem) ×1 IMPLANT
STRIP CLOSURE SKIN 1/2X4 (GAUZE/BANDAGES/DRESSINGS) ×2 IMPLANT
SUCTION FRAZIER HANDLE 10FR (MISCELLANEOUS) ×2
SUCTION TUBE FRAZIER 10FR DISP (MISCELLANEOUS) ×1 IMPLANT
SUT BROADBAND TAPE 2PK 1.5 (SUTURE) ×3 IMPLANT
SUT FIBERWIRE #2 38 T-5 BLUE (SUTURE)
SUT MAXBRAID (SUTURE) IMPLANT
SUT MNCRL AB 3-0 PS2 18 (SUTURE) ×2 IMPLANT
SUT SILK 2 0 TIES 10X30 (SUTURE) ×2 IMPLANT
SUT VIC AB 0 CT1 27 (SUTURE) ×6
SUT VIC AB 0 CT1 27XBRD ANBCTR (SUTURE) ×4 IMPLANT
SUT VIC AB 1 CT1 27 (SUTURE) ×6
SUT VIC AB 1 CT1 27XBRD ANBCTR (SUTURE) ×2 IMPLANT
SUT VIC AB 1 CT1 36 (SUTURE) ×2 IMPLANT
SUT VIC AB 2-0 CT1 27 (SUTURE) ×6
SUT VIC AB 2-0 CT1 TAPERPNT 27 (SUTURE) ×3 IMPLANT
SUT VICRYL 0 UR6 27IN ABS (SUTURE) ×9 IMPLANT
SUTURE FIBERWR #2 38 T-5 BLUE (SUTURE) IMPLANT
TOWEL GREEN STERILE (TOWEL DISPOSABLE) ×2 IMPLANT
TRAY FOL W/BAG SLVR 16FR STRL (SET/KITS/TRAYS/PACK) IMPLANT
TRAY FOLEY W/BAG SLVR 16FR LF (SET/KITS/TRAYS/PACK)
TRAY HUM REV SHOULDER 36 +3 (Shoulder) ×1 IMPLANT
TRAY HUM REV SHOULDER STD +6 (Shoulder) ×1 IMPLANT
WATER STERILE IRR 1000ML POUR (IV SOLUTION) ×2 IMPLANT

## 2021-02-14 NOTE — Anesthesia Procedure Notes (Addendum)
Anesthesia Regional Block: Interscalene brachial plexus block   Pre-Anesthetic Checklist: , timeout performed,  Correct Patient, Correct Site, Correct Laterality,  Correct Procedure, Correct Position, site marked,  Risks and benefits discussed,  Surgical consent,  Pre-op evaluation,  At surgeon's request and post-op pain management  Laterality: Left  Prep: chloraprep       Needles:  Injection technique: Single-shot  Needle Type: Echogenic Stimulator Needle     Needle Length: 9cm  Needle Gauge: 21     Additional Needles:   Procedures:,,,, ultrasound used (permanent image in chart),,    Narrative:  Start time: 02/14/2021 7:00 AM End time: 02/14/2021 7:05 AM Injection made incrementally with aspirations every 5 mL.  Performed by: Personally  Anesthesiologist: Shelton Silvas, MD  Additional Notes: Patient tolerated the procedure well. Local anesthetic introduced in an incremental fashion under minimal resistance after negative aspirations. No paresthesias were elicited. After completion of the procedure, no acute issues were identified and patient continued to be monitored by RN.

## 2021-02-14 NOTE — Op Note (Signed)
NAME: [name] [name], [name] [name]. MEDICAL RECORD NO: [id number] ACCOUNT NO: [id number] DATE OF BIRTH: [birth year] FACILITY: [facility] LOCATION: [facility] PHYSICIAN: Graylin Shiver. August Saucer, MD  Operative Report   DATE OF PROCEDURE: 02/14/2021  PREOPERATIVE DIAGNOSIS:  Left shoulder arthritis.  POSTOPERATIVE DIAGNOSIS:  Left shoulder arthritis.  PROCEDURE:  Left shoulder reverse shoulder replacement using Biomet mini humeral stem, size 9 with comprehensive mini humeral tray, standard thickness +6 taper offset, 40 mm diameter with +3 bearing, 36 mm diameter along with mini baseplate glenosphere  36+3 and 1 central compression screw, four peripheral locking screw.  SURGEON:  Graylin Shiver. August Saucer, MD.  ASSISTANT:  Karenann Cai  INDICATIONS:  The patient is a 75 year old gentleman with end-stage left shoulder arthritis, who presents for operative management after explanation of risks and benefits.  DESCRIPTION OF PROCEDURE:  The patient was brought to the operating room where general anesthetic was induced.  Preoperative antibiotics administered.  Timeout was called.  The patient was placed in the beach chair position with the head in neutral  position.  Left arm, shoulder and hand prescrubbed with first benzoyl peroxide and then alcohol and Betadine was allowed to air dry, prepped with DuraPrep solution and draped in a sterile manner.  ChloraPrep solution used to prep the entire operative  field.  The field was covered with [place].  The patient had restricted preoperative range of motion to about passively 20/60/80.  A timeout was called.  A deltopectoral approach was made.  Skin and subcutaneous tissue were sharply divided.  Cephalic vein  mobilized medially.  Manual release of the anterior portion of the deltoid was performed.  The axillary nerve was visualized and palpated and protected at all times during the case.  Subacromial adhesions were released manually.  Subdeltoid adhesions  also released.  Next, biceps tendon  was tenodesed to the pec tendon, which was released along its upper 1 to 1.5 cm.  The rotator interval was then opened as the biceps tendon was tagged and was used to open up the rotator interval to the base of the  coracoid.  Next, the circumflex vessels were ligated.  The subscap was then detached using first a 15 blade and then electrocautery.  Capsule was released 2 cm off the inferior aspect of the humeral neck using a Cobb elevator.  Large osteophytes were  also removed with care being taken to avoid injury to the underlying neurovascular structures.  A reverse retractor was used to facilitate exposure along with progressive external rotation.  Once the humerus was exposed the proximal reaming was performed  up to a size 9 and the head was cut in approximately 30 degrees of retroversion, which matched her native version.  Next, the broaching was performed up to a size 9 and a cap was placed.  Attention was then directed towards exposing the glenoid.   Posterior retractor was placed.  Circumferential excision of the capsule labral region was performed with care being taken to avoid injury to the underlying neurovascular structures.  Following this, using the patient-specific instrumentation and  appropriate retraction two guide pins were placed.  Good placement achieved and reaming according to preoperative templating was performed.  The glenoid baseplate was then placed with excellent compression achieved with the compression screw and 4  peripheral locking screws also were placed to add compression.  Next, a 36+3 glenosphere was placed based on reduction maneuvers.  This glenosphere was placed with about 1.5 mm of offset.  Next, attention was directed towards the humerus.  True humeral  stem was placed after drilling holes for subscap repair.  Subscap was mobilized with 360-degree release.  Six suture tapes were placed and the 9 stem was placed with good rotational stability achieved.  Next, the  standard and +3 polyethylene spacers were  trialled.  The +3 spacer was "two fingers tight." Difficult to reduce and difficult to dislocate.  This was a very stable construct without undue tension on the conjoined tendon or axillary nerve.  This construct was chosen, placed with same stability  parameters maintained.  The patient had about 45-50 degrees of external rotation, 90 degrees of abduction and about 120 of forward flexion.  Next, thorough irrigation was performed.  It should be noted that the IrriSept solution was used after the  incision, after arthrotomy and multiple times during the case.  Vancomycin powder was also placed into the humeral canal.  That was done prior to placement of the stem.  The subscap was then repaired with the arm externally rotated about 30 degrees.  Six  suture tapes were utilized for this.  Rotator interval was then closed and vancomycin powder and IrriSept solution placed around the prosthesis prior to rotator interval closure.  Deltopectoral split was then closed using #1 Vicryl suture followed by  interrupted inverted 0 Vicryl suture, 2-0 Vicryl suture, and 3-0 Monocryl.  Steri-Strips and Aquacel dressing applied.  The patient tolerated the procedure well without immediate complication.  Luke's assistant was required at all times for retraction,  opening, closing, mobilization of tissue.  His assistance was a medical necessity.   PUS D: 02/14/2021 11:24:26 am T: 02/14/2021 12:10:00 pm  JOB: [id number]/ [id number]

## 2021-02-14 NOTE — Anesthesia Preprocedure Evaluation (Addendum)
Anesthesia Evaluation  Patient identified by MRN, date of birth, ID band Patient awake    Reviewed: Allergy & Precautions, NPO status , Patient's Chart, lab work & pertinent test results  Airway Mallampati: III  TM Distance: >3 FB Neck ROM: Full    Dental  (+) Teeth Intact, Dental Advisory Given, Partial Upper   Pulmonary sleep apnea , COPD, former smoker,    Pulmonary exam normal        Cardiovascular hypertension, Pt. on medications  Rhythm:Regular Rate:Normal     Neuro/Psych PSYCHIATRIC DISORDERS Anxiety Depression negative neurological ROS     GI/Hepatic negative GI ROS, Neg liver ROS,   Endo/Other  Hypothyroidism   Renal/GU negative Renal ROS     Musculoskeletal  (+) Arthritis ,   Abdominal Normal abdominal exam  (+)   Peds  Hematology negative hematology ROS (+)   Anesthesia Other Findings - PTSD  Reproductive/Obstetrics                           Anesthesia Physical Anesthesia Plan  ASA: 3  Anesthesia Plan: General   Post-op Pain Management: GA combined w/ Regional for post-op pain   Induction: Intravenous  PONV Risk Score and Plan: 4 or greater and Ondansetron, Dexamethasone, Midazolam and Treatment may vary due to age or medical condition  Airway Management Planned: Oral ETT  Additional Equipment: None  Intra-op Plan:   Post-operative Plan: Extubation in OR  Informed Consent: I have reviewed the patients History and Physical, chart, labs and discussed the procedure including the risks, benefits and alternatives for the proposed anesthesia with the patient or authorized representative who has indicated his/her understanding and acceptance.     Dental advisory given  Plan Discussed with: CRNA  Anesthesia Plan Comments:        Anesthesia Quick Evaluation

## 2021-02-14 NOTE — Anesthesia Procedure Notes (Signed)
Procedure Name: Intubation Date/Time: 02/14/2021 7:42 AM Performed by: Lovie Chol, CRNA Pre-anesthesia Checklist: Patient identified, Emergency Drugs available, Suction available and Patient being monitored Patient Re-evaluated:Patient Re-evaluated prior to induction Oxygen Delivery Method: Circle System Utilized Preoxygenation: Pre-oxygenation with 100% oxygen Induction Type: IV induction Ventilation: Mask ventilation without difficulty Laryngoscope Size: Miller and 2 Grade View: Grade II Tube type: Oral Tube size: 7.0 mm Number of attempts: 1 Airway Equipment and Method: Stylet and Oral airway Placement Confirmation: ETT inserted through vocal cords under direct vision, positive ETCO2 and breath sounds checked- equal and bilateral Secured at: 20 cm Tube secured with: Tape Dental Injury: Teeth and Oropharynx as per pre-operative assessment

## 2021-02-14 NOTE — Brief Op Note (Signed)
   02/14/2021  11:15 AM  PATIENT:  [name]  75 y.o. female  PRE-OPERATIVE DIAGNOSIS:  left shoulder osteoarthritis  POST-OPERATIVE DIAGNOSIS:  left shoulder osteoarthritis  PROCEDURE:  Procedure(s): LEFT REVERSE SHOULDER ARTHROPLASTY  SURGEON:  Surgeon(s): Cammy Copa, MD  ASSISTANT: magnant pa  ANESTHESIA:   general  EBL: 75 ml    Total I/O In: 1200 [I.V.:1000; IV Piggyback:200] Out: 100 [Blood:100]  BLOOD ADMINISTERED: none  DRAINS: none   LOCAL MEDICATIONS USED:  vanc powder   SPECIMEN:  No Specimen  COUNTS:  YES  TOURNIQUET:  * No tourniquets in log *  DICTATION: .Other Dictation: Dictation Number [id number]  PLAN OF CARE: Admit for overnight observation  PATIENT DISPOSITION:  PACU - hemodynamically stable

## 2021-02-14 NOTE — H&P (Signed)
[name] [name] is an 75 y.o. female.   Chief Complaint: Left shoulder pain HPI: [name] [name] is a 75 year old patient with left shoulder pain.  Hard for her to put her hand on the steering wheel of the car.  Pain wakes her from sleep on a nightly basis.  Does radiate to some degree into the neck.  Since she was last seen she has had a CT scan which shows severe glenohumeral joint arthritis as well as multiple loose bodies and some moderate glenoid deformity.  She has failed conservative management and presents now for operative management  Past Medical History:  Diagnosis Date   COPD (chronic obstructive pulmonary disease) (HCC)    Depression    Dyspnea    Essential hypertension, benign 01/21/2019   History of pneumonia    2010 on ventalator   History of recurrent UTIs    Hypothyroidism    Insomnia 12/18/2018   Left knee DJD    Osteoporosis    Pneumonia    PTSD (post-traumatic stress disorder)    Restless leg syndrome    Sleep apnea     Past Surgical History:  Procedure Laterality Date   ABDOMINAL HYSTERECTOMY     KNEE ARTHROSCOPY WITH MEDIAL MENISECTOMY Left 08/24/2013   Procedure: LEFT KNEE ARTHROSCOPY WITH MEDIAL MENISCECTOMY  ;  Surgeon: Nilda Simmer, MD;  Location: [facility] OR;  Service: Orthopedics;  Laterality: Left;   PARTIAL KNEE ARTHROPLASTY Left 08/24/2013   dr Denton Ar   PARTIAL KNEE ARTHROPLASTY Left 08/24/2013   Procedure: LEFT KNEE ARTHROPLASTY UNI COMPARTMENTAL ;  Surgeon: Nilda Simmer, MD;  Location: [facility] OR;  Service: Orthopedics;  Laterality: Left;  Left knee arthroplasty uni compartment  With left knee scope medial meniscectomy     TOTAL VAGINAL HYSTERECTOMY      Family History  Problem Relation Age of Onset   Stroke Mother    Heart disease Father    Heart attack Father    Hypertension Father    Heart attack Brother    Emphysema Son    Hypertension Son    Emphysema Daughter    Social History:  reports that she quit smoking about 17 years ago. Her smoking use  included cigarettes. She has a 52.50 pack-year smoking history. She has never used smokeless tobacco. She reports that she does not drink alcohol and does not use drugs.  Allergies:  Allergies  Allergen Reactions   Codeine Itching    Medications Prior to Admission  Medication Sig Dispense Refill   albuterol (PROVENTIL) (2.5 MG/3ML) 0.083% nebulizer solution INHALE THE CONTENTS OF 1 VIAL VIA NEBULIZER EVERY 6 HOURS AS NEEDED FOR WHEEZING OR SHORTNESS OF BREATH 180 mL 2   ALPRAZolam (XANAX) 0.5 MG tablet Take 1 tablet (0.5 mg total) by mouth at bedtime as needed for anxiety. (Patient taking differently: Take 0.5 mg by mouth at bedtime.) 30 tablet 3   amitriptyline (ELAVIL) 50 MG tablet Take 1 tablet (50 mg total) by mouth daily. (Patient taking differently: Take 50 mg by mouth at bedtime.) 90 tablet 3   amLODipine (NORVASC) 2.5 MG tablet Take 1 tablet (2.5 mg total) by mouth daily. (Patient taking differently: Take 2.5 mg by mouth at bedtime.) 90 tablet 3   amoxicillin (AMOXIL) 500 MG tablet Take 1 tablet (500 mg total) by mouth every 8 (eight) hours. 6 tablet 0   Carboxymethylcellul-Glycerin (LUBRICATING EYE DROPS OP) Place 1 drop into both eyes daily as needed (dry eyes).     Cholecalciferol 125 MCG (5000 UT)  TABS Take 5,000 Units by mouth at bedtime.     gabapentin (NEURONTIN) 300 MG capsule Take 2 capsules (600 mg total) by mouth at bedtime. 180 capsule 3   levothyroxine (SYNTHROID) 88 MCG tablet Take 1 tablet (88 mcg total) by mouth daily. (Patient taking differently: Take 88 mcg by mouth at bedtime.) 90 tablet 0   naproxen sodium (ALEVE) 220 MG tablet Take 660 mg by mouth 2 (two) times daily as needed (pain).      No results found for this or any previous visit (from the past 48 hour(s)). No results found.  Review of Systems  Musculoskeletal:  Positive for arthralgias.  All other systems reviewed and are negative.  Blood pressure (!) 131/58, pulse 78, temperature 97.8 F (36.6 C),  temperature source Oral, resp. rate 18, height 4\' 11"  (1.499 m), weight 64 kg, SpO2 97 %. Physical Exam Vitals reviewed.  HENT:     Head: Normocephalic.     Nose: Nose normal.     Mouth/Throat:     Mouth: Mucous membranes are moist.  Eyes:     Pupils: Pupils are equal, round, and reactive to light.  Cardiovascular:     Rate and Rhythm: Normal rate.     Pulses: Normal pulses.  Pulmonary:     Effort: Pulmonary effort is normal.  Abdominal:     General: Abdomen is flat.  Musculoskeletal:     Cervical back: Normal range of motion.  Skin:    General: Skin is warm.     Capillary Refill: Capillary refill takes less than 2 seconds.  Neurological:     General: No focal deficit present.     Mental Status: She is alert.  Psychiatric:        Mood and Affect: Mood normal.     Ortho exam demonstrates range of motion on the left passive at 10/45/80.  On the right her range of motion is 60/80/140.  Rotator cuff strength slightly weaker on the left likely due to pain compared to the right to supraspinatus and infraspinatus testing.  Motor or sensory function to the hand is intact.  Deltoid fires.  Neck range of motion is full.  Radial pulse intact. Assessment/Plan  Impression is left shoulder severe end-stage arthritis with significant limitation of motion.  Plan reverse shoulder replacement after risk and benefits are discussed including not limited to infection nerve vessel damage instability incomplete pain relief as well as potential need for further surgery.  Patient understands the risk and benefits.  All questions answered.  05-05-1999, MD 02/14/2021, 6:13 AM

## 2021-02-14 NOTE — Anesthesia Postprocedure Evaluation (Signed)
Anesthesia Post Note  Patient: [name] [name]  Procedure(s) Performed: LEFT REVERSE SHOULDER ARTHROPLASTY (Left: Shoulder)     Patient location during evaluation: PACU Anesthesia Type: General Level of consciousness: awake and alert Pain management: pain level controlled Vital Signs Assessment: post-procedure vital signs reviewed and stable Respiratory status: spontaneous breathing, nonlabored ventilation, respiratory function stable and patient connected to nasal cannula oxygen Cardiovascular status: blood pressure returned to baseline and stable Postop Assessment: no apparent nausea or vomiting Anesthetic complications: no   No notable events documented.  Last Vitals:  Vitals:   02/14/21 1225 02/14/21 1247  BP: (!) 122/57 126/66  Pulse: 68 69  Resp: 17 18  Temp: 36.5 C   SpO2: 95% 99%    Last Pain:  Vitals:   02/14/21 1250  TempSrc:   PainSc: 0-No pain                 Shelton Silvas

## 2021-02-14 NOTE — Transfer of Care (Signed)
Immediate Anesthesia Transfer of Care Note  Patient: PRERNA HAROLD  Procedure(s) Performed: LEFT REVERSE SHOULDER ARTHROPLASTY (Left: Shoulder)  Patient Location: PACU  Anesthesia Type:GA combined with regional for post-op pain  Level of Consciousness: awake, drowsy, patient cooperative and responds to stimulation  Airway & Oxygen Therapy: Patient Spontanous Breathing and Patient connected to nasal cannula oxygen  Post-op Assessment: Report given to RN and Post -op Vital signs reviewed and stable  Post vital signs: stable  Last Vitals:  Vitals Value Taken Time  BP 106/53 02/14/21 1125  Temp 97.6   Pulse 68 02/14/21 1126  Resp 19 02/14/21 1126  SpO2 98 % 02/14/21 1126  Vitals shown include unvalidated device data.  Last Pain:  Vitals:   02/14/21 0608  TempSrc: Oral  PainSc:          Complications: No notable events documented.

## 2021-02-15 ENCOUNTER — Encounter (HOSPITAL_COMMUNITY): Payer: Self-pay | Admitting: Orthopedic Surgery

## 2021-02-15 DIAGNOSIS — M19012 Primary osteoarthritis, left shoulder: Secondary | ICD-10-CM | POA: Diagnosis not present

## 2021-02-15 DIAGNOSIS — J449 Chronic obstructive pulmonary disease, unspecified: Secondary | ICD-10-CM | POA: Diagnosis not present

## 2021-02-15 DIAGNOSIS — Z87891 Personal history of nicotine dependence: Secondary | ICD-10-CM | POA: Diagnosis not present

## 2021-02-15 DIAGNOSIS — Z96652 Presence of left artificial knee joint: Secondary | ICD-10-CM | POA: Diagnosis not present

## 2021-02-15 DIAGNOSIS — Z79899 Other long term (current) drug therapy: Secondary | ICD-10-CM | POA: Diagnosis not present

## 2021-02-15 LAB — BASIC METABOLIC PANEL
Anion gap: 10 (ref 5–15)
BUN: 17 mg/dL (ref 8–23)
CO2: 22 mmol/L (ref 22–32)
Calcium: 9.6 mg/dL (ref 8.9–10.3)
Chloride: 101 mmol/L (ref 98–111)
Creatinine, Ser: 0.86 mg/dL (ref 0.44–1.00)
GFR, Estimated: >60 mL/min (ref >60)
Glucose, Bld: 160 mg/dL — ABNORMAL HIGH (ref 70–99)
Potassium: 4.3 mmol/L (ref 3.5–5.1)
Sodium: 133 mmol/L — ABNORMAL LOW (ref 135–145)

## 2021-02-15 LAB — CBC
HCT: 36.2 % (ref 36.0–46.0)
Hemoglobin: 11.4 g/dL — ABNORMAL LOW (ref 12.0–15.0)
MCH: 27 pg (ref 26.0–34.0)
MCHC: 31.5 g/dL (ref 30.0–36.0)
MCV: 85.6 fL (ref 80.0–100.0)
Platelets: 309 10*3/uL (ref 150–400)
RBC: 4.23 MIL/uL (ref 3.87–5.11)
RDW: 14.1 % (ref 11.5–15.5)
WBC: 28.9 10*3/uL — ABNORMAL HIGH (ref 4.0–10.5)
nRBC: 0 % (ref 0.0–0.2)

## 2021-02-15 MED ORDER — DOCUSATE SODIUM 100 MG PO CAPS: 100.0000 mg | ORAL_CAPSULE | Freq: Two times a day (BID) | ORAL | 0 refills | Status: DC

## 2021-02-15 MED ORDER — ASPIRIN 81 MG PO TBEC: 81.0000 mg | DELAYED_RELEASE_TABLET | Freq: Every day | ORAL | 11 refills | Status: DC

## 2021-02-15 MED ORDER — ALPRAZOLAM 0.5 MG PO TABS
0.5000 mg | ORAL_TABLET | Freq: Every evening | ORAL | Status: DC | PRN
  Administered 2021-02-15: 0.5 mg via ORAL
  Filled 2021-02-15: qty 1

## 2021-02-15 MED ORDER — METHOCARBAMOL 500 MG PO TABS: 500.0000 mg | ORAL_TABLET | Freq: Three times a day (TID) | ORAL | 0 refills | Status: DC | PRN

## 2021-02-15 MED ORDER — HYDROCODONE-ACETAMINOPHEN 5-325 MG PO TABS: 1.0000 | ORAL_TABLET | ORAL | 0 refills | Status: DC | PRN

## 2021-02-15 MED ORDER — METOCLOPRAMIDE HCL 5 MG PO TABS: 5.0000 mg | ORAL_TABLET | Freq: Three times a day (TID) | ORAL | 0 refills | Status: DC | PRN

## 2021-02-15 NOTE — Plan of Care (Signed)
  Problem: Safety: Goal: Ability to remain free from injury will improve Outcome: Completed/Met   Problem: Education: Goal: Knowledge of the prescribed therapeutic regimen will improve Outcome: Completed/Met Goal: Understanding of activity limitations/precautions following surgery will improve Outcome: Completed/Met Goal: Individualized Educational Video(s) Outcome: Completed/Met   Problem: Activity: Goal: Ability to tolerate increased activity will improve Outcome: Completed/Met   Problem: Pain Management: Goal: Pain level will decrease with appropriate interventions Outcome: Completed/Met

## 2021-02-15 NOTE — Progress Notes (Signed)
PT Cancellation Note and Discharge  Patient Details Name: [name] [name] MRN: [id number] DOB: [birth year]   Cancelled Treatment:    Reason Eval/Treat Not Completed: PT screened, no needs identified, will sign off. Discussed pt case with OT who reports pt has no current PT needs. See OT eval for current level of mobility. Pt to d/c this morning. If needs change, please reconsult.    Marylynn Pearson 02/15/2021, 9:20 AM  Conni Slipper, PT, DPT Acute Rehabilitation Services Pager: [phone] Office: [phone]

## 2021-02-15 NOTE — Progress Notes (Signed)
  Subjective: [name] [name] is a 75 y.o. female s/p left RSA.  They are POD 1.  Pt's pain is controlled.  Patient had difficulty with nausea last night but now this is controlled after she had Reglan.  Denies any chest pain, shortness of breath, calf pain, abdominal pain.  She was able to get up and walk with her daughter all the way down the hall and back as well as perform well with physical and Occupational Therapy today.  Objective: Vital signs in last 24 hours: Temp:  [97 F (36.1 C)-98.4 F (36.9 C)] 98.2 F (36.8 C) (11/09 0822) Pulse Rate:  [66-106] 97 (11/09 0822) Resp:  [12-20] 16 (11/09 0822) BP: (106-142)/(50-66) 136/55 (11/09 0822) SpO2:  [94 %-100 %] 94 % (11/09 0822)  Intake/Output from previous day: 11/08 0701 - 11/09 0700 In: 1210 [I.V.:1010; IV Piggyback:200] Out: 100 [Blood:100] Intake/Output this shift: No intake/output data recorded.  Exam:  No gross blood or drainage overlying the dressing 2+ radial pulse Sensation intact distally in the left hand Able to extend the left wrist.  FPL, EPL, finger abduction intact. Nerve intact with deltoid firing.   Labs: Recent Labs    02/15/21 0223  HGB 11.4*   Recent Labs    02/15/21 0223  WBC 28.9*  RBC 4.23  HCT 36.2  PLT 309   Recent Labs    02/15/21 0223  NA 133*  K 4.3  CL 101  CO2 22  BUN 17  CREATININE 0.86  GLUCOSE 160*  CALCIUM 9.6   No results for input(s): LABPT, INR in the last 72 hours.  Assessment/Plan: Pt is POD 1 s/p left RSA    -Plan to discharge to home today.  Patient did very well with physical therapy and Occupational Therapy.  No dizziness with ambulation.  No complaints today.  She will call the office with any concerns.  She will use black brace CPM with our office arranging for medical to call the patient to set this up.  -No lifting with the operative arm     Joycie Peek Matt Delpizzo 02/15/2021, 10:48 AM

## 2021-02-15 NOTE — Progress Notes (Signed)
Pt awaiting transport to her vehicle via wheelchair by volunteer to for discharge home; in no acute distress nor complaints of pain nor discomfort; incision on her left shoulder with hydrocolloid dressing with sling on and is clean, dry and intact; room was checked and accounted for all her belongings; discharge instructions concerning his medications, wound care, follow up appointment and when to call the doctor as needed were discussed with patient and her daughter by RN and both expressed understanding on the instructions given.

## 2021-02-15 NOTE — Progress Notes (Signed)
Occupational Therapy Evaluation Patient Details Name: [name] [name] MRN: [id number] DOB: [birth year] Today's Date: 02/15/2021   History of Present Illness [name] [name] is a 75 year old patient who is now s/p L reverse TSA on 11/8 due to  severe glenohumeral joint arthritis as well as multiple loose bodies and some moderate glenoid deformity.   Clinical Impression   [name] was indep PTA, drives and lives in a 1 level home, 1 STE with her husband who is able to assist 24/7 and a daughter that lives near by. Pt verbalized understanding of NWB and no AROM of LUE, and demonstrated great ability to complete ADLs while adhering to precautions with use of compensatory techniques. Pt tolerated all LUE exercises well, see below. Overall pt was min guard for functional ambulation within the room, and min A for ADLs. Pt does not required further OT acutely. Recommend to follow Mds follow up instructions for rehab.      Recommendations for follow up therapy are one component of a multi-disciplinary discharge planning process, led by the attending physician.  Recommendations may be updated based on patient status, additional functional criteria and insurance authorization.   Follow Up Recommendations  Follow physician's recommendations for discharge plan and follow up therapies    Assistance Recommended at Discharge Intermittent Supervision/Assistance  Functional Status Assessment  Patient has had a recent decline in their functional status and demonstrates the ability to make significant improvements in function in a reasonable and predictable amount of time.  Equipment Recommendations  None recommended by OT       Precautions / Restrictions Precautions Precautions: Fall Restrictions Weight Bearing Restrictions: No      Mobility Bed Mobility Overal bed mobility: Needs Assistance Bed Mobility: Rolling;Sidelying to Sit;Sit to Sidelying Rolling: Min guard Sidelying to sit: Min guard     Sit to  sidelying: Min guard General bed mobility comments: min guard for safety - cue for pillow placement behind LUE    Transfers Overall transfer level: Modified independent Equipment used: None                      Balance Overall balance assessment: Needs assistance Sitting-balance support: Feet supported Sitting balance-Leahy Scale: Good     Standing balance support: No upper extremity supported;During functional activity Standing balance-Leahy Scale: Fair             ADL either performed or assessed with clinical judgement   ADL Overall ADL's : Needs assistance/impaired Eating/Feeding: Independent;Sitting   Grooming: Oral care;Applying deodorant;Brushing hair;Minimal assistance;Cueing for compensatory techniques;Standing   Upper Body Bathing: Minimal assistance;Adhering to UE precautions;Cueing for compensatory techniques;Sitting   Lower Body Bathing: Minimal assistance;Sit to/from stand;Cueing for compensatory techniques   Upper Body Dressing : Minimal assistance;Cueing for compensatory techniques;Sitting   Lower Body Dressing: Minimal assistance;Sit to/from stand   Toilet Transfer: Min guard;Ambulation   Toileting- Clothing Manipulation and Hygiene: Min guard;Sit to/from stand       Functional mobility during ADLs: Min guard General ADL Comments: assist levels for LUE precautions, management of LUE, cues for compensatory tehcnqiues     Vision Baseline Vision/History: 0 No visual deficits Patient Visual Report: No change from baseline Vision Assessment?: No apparent visual deficits            Pertinent Vitals/Pain Pain Assessment: Faces Faces Pain Scale: Hurts little more Pain Location: shoulder Pain Descriptors / Indicators: Discomfort;Guarding Pain Intervention(s): Limited activity within patient's tolerance;Monitored during session;Repositioned     Hand Dominance Right   Extremity/Trunk Assessment Upper  Extremity Assessment Upper Extremity  Assessment: LUE deficits/detail LUE Deficits / Details: s/p L reverse TSA LUE: Unable to fully assess due to immobilization LUE Sensation: decreased light touch LUE Coordination: decreased fine motor;decreased gross motor   Lower Extremity Assessment Lower Extremity Assessment: Overall WFL for tasks assessed   Cervical / Trunk Assessment Cervical / Trunk Assessment: Normal   Communication Communication Communication: No difficulties   Cognition Arousal/Alertness: Awake/alert Behavior During Therapy: WFL for tasks assessed/performed Overall Cognitive Status: Within Functional Limits for tasks assessed             General Comments  VSS on RA, daughter present and supportive     Home Living Family/patient expects to be discharged to:: Private residence Living Arrangements: Spouse/significant other;Other relatives Available Help at Discharge: Family;Available 24 hours/day Type of Home: House Home Access: Stairs to enter [employer] of Steps: 1   Home Layout: One level     Bathroom Shower/Tub: Tub/shower unit;Walk-in shower   Bathroom Toilet: Handicapped height     Home Equipment: Agricultural consultant (2 wheels);BSC/3in1;Shower seat;Cane - single point;Grab bars - tub/shower          Prior Functioning/Environment Prior Level of Function : Independent/Modified Independent             Mobility Comments: no AD ADLs Comments: drives        OT Problem List: Decreased strength;Decreased range of motion;Decreased activity tolerance;Decreased safety awareness;Decreased knowledge of use of DME or AE;Pain;Impaired UE functional use         OT Goals(Current goals can be found in the care plan section) Acute Rehab OT Goals Patient Stated Goal: home today OT Goal Formulation: All assessment and education complete, DC therapy      AM-PAC OT "6 Clicks" Daily Activity     Outcome Measure Help from another person eating meals?: None Help from another person  taking care of personal grooming?: A Little Help from another person toileting, which includes using toliet, bedpan, or urinal?: A Little Help from another person bathing (including washing, rinsing, drying)?: A Little Help from another person to put on and taking off regular upper body clothing?: A Little Help from another person to put on and taking off regular lower body clothing?: A Little 6 Click Score: 19   End of Session Equipment Utilized During Treatment:  (sling) Nurse Communication: Mobility status  Activity Tolerance: Patient tolerated treatment well Patient left: in bed;with call bell/phone within reach;with family/visitor present  OT Visit Diagnosis: Other abnormalities of gait and mobility (R26.89);Muscle weakness (generalized) (M62.81);Pain                Time: 0093-8182 OT Time Calculation (min): 35 min Charges:  OT General Charges $OT Visit: 1 Visit OT Evaluation $OT Eval Moderate Complexity: 1 Mod OT Treatments $Self Care/Home Management : 8-22 mins    Gianlucca Szymborski A Efe Fazzino 02/15/2021, 9:15 AM

## 2021-02-16 ENCOUNTER — Encounter (HOSPITAL_COMMUNITY): Payer: Self-pay | Admitting: Orthopedic Surgery

## 2021-02-18 ENCOUNTER — Other Ambulatory Visit: Payer: Self-pay | Admitting: Physician Assistant

## 2021-02-18 MED ORDER — HYDROMORPHONE HCL 2 MG PO TABS: ORAL_TABLET | ORAL | 0 refills | Status: DC

## 2021-02-20 ENCOUNTER — Other Ambulatory Visit: Payer: Self-pay

## 2021-02-20 ENCOUNTER — Ambulatory Visit: Payer: Self-pay

## 2021-02-20 ENCOUNTER — Encounter: Payer: Self-pay | Admitting: Orthopedic Surgery

## 2021-02-20 ENCOUNTER — Telehealth: Payer: Self-pay | Admitting: Radiology

## 2021-02-20 ENCOUNTER — Ambulatory Visit (INDEPENDENT_AMBULATORY_CARE_PROVIDER_SITE_OTHER): Payer: Medicare HMO | Admitting: Orthopedic Surgery

## 2021-02-20 ENCOUNTER — Telehealth: Payer: Self-pay | Admitting: Orthopedic Surgery

## 2021-02-20 DIAGNOSIS — Z96612 Presence of left artificial shoulder joint: Secondary | ICD-10-CM | POA: Diagnosis not present

## 2021-02-20 DIAGNOSIS — M25532 Pain in left wrist: Secondary | ICD-10-CM

## 2021-02-20 DIAGNOSIS — M25512 Pain in left shoulder: Secondary | ICD-10-CM

## 2021-02-20 NOTE — Progress Notes (Signed)
Office Visit Note   Patient: [name] [name]           Date of Birth: [birth year]           MRN: [id number] Visit Date: 02/20/2021 Requested by: Tommie Sams, DO [address] [place],  [place] [address] PCP: Tommie Sams, DO  Subjective: Chief Complaint  Patient presents with   Left Shoulder - Routine Post Op     02/14/21 (6d)  Left Reverse Shoulder Arthroplasty - Left      HPI: Happy is a 75 year old patient who underwent left reverse shoulder replacement about a week ago.  She had a fall several days ago.  Describes significant left wrist pain since that time.  Shoulder is doing reasonably well.  Having a little bit of mid humeral pain.  Denies any other orthopedic complaints.  Specifically no groin pain.              ROS: All systems reviewed are negative as they relate to the chief complaint within the history of present illness.  Patient denies  fevers or chills.   Assessment & Plan: Visit Diagnoses:  1. Left shoulder pain, unspecified chronicity   2. History of arthroplasty of left shoulder   3. Pain in left wrist     Plan: Impression is left wrist pain with swelling status post fall on outstretched wrist.  Shoulder is located and no periprosthetic fracture present in the shoulder.  Elbow range of motion intact.  Based on the amount of swelling present and negative radiographs CT scan of the left wrist is indicated to rule out occult fracture.  Wrist splint applied.  Follow-up after that study.  Follow-Up Instructions: Return in about 1 week (around 02/27/2021).   Orders:  Orders Placed This Encounter  Procedures   XR Shoulder Left   XR Wrist 2 Views Left   CT WRIST LEFT WO CONTRAST   No orders of the defined types were placed in this encounter.     Procedures: No procedures performed   Clinical Data: No additional findings.  Objective: Vital Signs: There were no vitals taken for this visit.  Physical Exam:   Constitutional: Patient appears  well-developed HEENT:  Head: Normocephalic Eyes:EOM are normal Neck: Normal range of motion Cardiovascular: Normal rate Pulmonary/chest: Effort normal Neurologic: Patient is alert Skin: Skin is warm Psychiatric: Patient has normal mood and affect   Ortho Exam: Ortho exam demonstrates functional deltoid.  Range of motion is particularly diminished.  We will DC the sling at this time.  Left wrist has significant soft tissue swelling.  EPL FPL interosseous function intact with radial pulse intact.  Left elbow range of motion intact as well with flexion and extension.  Specialty Comments:  No specialty comments available.  Imaging: XR Shoulder Left  Result Date: 02/20/2021 AP axillary outlet radiographs left shoulder reviewed.  Reverse shoulder prosthesis in good position alignment with no complicating features.  No acute fracture.  Visualized lung fields clear  XR Wrist 2 Views Left  Result Date: 02/20/2021 AP lateral radiographs left wrist reviewed.  No acute fracture.  Soft tissue swelling is present.  Scapholunate angle intact.  Minimal degenerative arthritis within the radiocarpal joint.  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     Past Medical History:  Diagnosis Date   COPD (chronic obstructive pulmonary disease) (HCC)     Depression    Dyspnea    Essential hypertension, benign 01/21/2019   History of pneumonia    2010 on ventalator   History of recurrent UTIs    Hypothyroidism    Insomnia 12/18/2018   Left knee DJD    Osteoporosis    Pneumonia    PTSD (post-traumatic stress disorder)    Restless leg syndrome    Sleep apnea     Family History  Problem Relation Age of Onset   Stroke Mother    Heart disease Father    Heart attack Father    Hypertension Father    Heart attack Brother    Emphysema Son    Hypertension Son    Emphysema Daughter     Past Surgical History:  Procedure Laterality Date   ABDOMINAL HYSTERECTOMY     KNEE ARTHROSCOPY WITH MEDIAL MENISECTOMY Left 08/24/2013   Procedure: LEFT KNEE ARTHROSCOPY WITH MEDIAL MENISCECTOMY  ;  Surgeon: Nilda Simmer, MD;  Location: [facility] OR;  Service: Orthopedics;  Laterality: Left;   PARTIAL KNEE ARTHROPLASTY Left 08/24/2013   dr Denton Ar   PARTIAL KNEE ARTHROPLASTY Left 08/24/2013   Procedure: LEFT KNEE ARTHROPLASTY UNI COMPARTMENTAL ;  Surgeon: Nilda Simmer, MD;  Location: [facility] OR;  Service: Orthopedics;  Laterality: Left;  Left knee arthroplasty uni compartment  With left knee scope medial meniscectomy     REVERSE SHOULDER ARTHROPLASTY Left 02/14/2021   Procedure: LEFT REVERSE SHOULDER ARTHROPLASTY;  Surgeon: Cammy Copa, MD;  Location: [facility] OR;  Service: Orthopedics;  Laterality: Left;   TOTAL VAGINAL HYSTERECTOMY     Social History   Occupational History   Occupation: Retired  Tobacco Use   Smoking status: Former    Packs/day: 1.50    Years: 35.00    Pack years: 52.50    Types: Cigarettes    Quit date: 04/10/2003    Years since quitting: 17.8   Smokeless tobacco: Never  Vaping Use   Vaping Use: Never used  Substance and Sexual Activity   Alcohol use: No   Drug use: No   Sexual activity: Not on file

## 2021-02-20 NOTE — Telephone Encounter (Signed)
Melissa called again. Luke advised. He will reach out to patients daughter to discuss.

## 2021-02-20 NOTE — Telephone Encounter (Signed)
Patient's daughter Efraim Kaufmann called advised patient is having a lot of pain in her left wrist. Melissa said she called earlier but have not had a call back yet. Melissa said she have to go to work and asked if her father could be called in reference to the pain her mother is experiencing.  Melissa said her mother's pain medicine was changed  and she do not know why. Melissa said the pain medicine is not  working to help with the pain. The number to contact Mr. Southard is [phone]

## 2021-02-20 NOTE — Telephone Encounter (Signed)
Efraim Kaufmann is calling states that patient had Total reverse shoulder last week. And she is in a lot of pain. Please advise. Call back number [phone]

## 2021-02-21 ENCOUNTER — Ambulatory Visit (HOSPITAL_COMMUNITY)
Admission: RE | Admit: 2021-02-21 | Discharge: 2021-02-21 | Disposition: A | Discharge status: Home / Self Care | Payer: Medicare HMO | Source: Ambulatory Visit | Attending: Orthopedic Surgery | Admitting: Orthopedic Surgery

## 2021-02-21 DIAGNOSIS — M25532 Pain in left wrist: Secondary | ICD-10-CM | POA: Diagnosis not present

## 2021-02-21 DIAGNOSIS — M19032 Primary osteoarthritis, left wrist: Secondary | ICD-10-CM | POA: Diagnosis not present

## 2021-02-21 NOTE — Telephone Encounter (Signed)
I called her and she came in to see Dr. August Saucer yesterday

## 2021-02-22 NOTE — Progress Notes (Signed)
No fx pls call thx

## 2021-02-22 NOTE — Progress Notes (Signed)
IC advised.  

## 2021-02-26 DIAGNOSIS — M19012 Primary osteoarthritis, left shoulder: Secondary | ICD-10-CM

## 2021-03-01 ENCOUNTER — Other Ambulatory Visit: Payer: Self-pay

## 2021-03-01 ENCOUNTER — Encounter: Payer: Self-pay | Admitting: Orthopedic Surgery

## 2021-03-01 ENCOUNTER — Ambulatory Visit (INDEPENDENT_AMBULATORY_CARE_PROVIDER_SITE_OTHER): Payer: Medicare HMO

## 2021-03-01 ENCOUNTER — Ambulatory Visit (INDEPENDENT_AMBULATORY_CARE_PROVIDER_SITE_OTHER): Payer: Medicare HMO | Admitting: Orthopedic Surgery

## 2021-03-01 DIAGNOSIS — Z96612 Presence of left artificial shoulder joint: Secondary | ICD-10-CM | POA: Diagnosis not present

## 2021-03-01 NOTE — Progress Notes (Signed)
Post-Op Visit Note   Patient: [name] [name]           Date of Birth: 1946-01-02           MRN: [id number] Visit Date: 03/01/2021 PCP: [name] Sams, DO   Assessment & Plan:  Chief Complaint:  Chief Complaint  Patient presents with   Left Shoulder - Routine Post Op     02/14/21 (2w 1d)   Left Reverse Shoulder Arthroplasty - Left     Visit Diagnoses:  1. History of arthroplasty of left shoulder     Plan: [name] [name] is a 75 year old patient 2 weeks out left reverse shoulder replacement.  On exam her shoulder is located.  Deltoid fires.  Incision intact.  Passive range of motion is about 20/40/50.  Plan at this time is to start physical therapy and continue CPM.  DC sling.  4-week return.  Follow-Up Instructions: Return in about 4 weeks (around 03/29/2021).   Orders:  Orders Placed This Encounter  Procedures   XR Shoulder Left   No orders of the defined types were placed in this encounter.   Imaging: XR Shoulder Left  Result Date: 03/01/2021 AP outlet radiographs left shoulder reviewed.  Reverse shoulder prosthesis in good position alignment with no complicating features.  Shoulder is located.   PMFS History: Patient Active Problem List   Diagnosis Date Noted   Arthritis of left shoulder region    S/P reverse total shoulder arthroplasty, left 02/14/2021   Recurrent urinary tract infection 11/08/2020   Bilateral shoulder pain 11/08/2020   Age-related osteoporosis without current pathological fracture 07/22/2019   Upper airway cough syndrome 03/04/2019   DOE (dyspnea on exertion) 03/03/2019   Essential hypertension, benign 01/21/2019   Insomnia 12/18/2018   Vitamin D deficiency disease 12/18/2018   Pulmonary nodule 04/27/2016   Left knee DJD 08/24/2013   DJD (degenerative joint disease) of knee 08/24/2013   Hypothyroidism    Restless leg syndrome    Depression    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     Past Medical History:  Diagnosis Date   COPD (chronic obstructive  pulmonary disease) (HCC)    Depression    Dyspnea    Essential hypertension, benign 01/21/2019   History of pneumonia    2010 on ventalator   History of recurrent UTIs    Hypothyroidism    Insomnia 12/18/2018   Left knee DJD    Osteoporosis    Pneumonia    PTSD (post-traumatic stress disorder)    Restless leg syndrome    Sleep apnea     Family History  Problem Relation Age of Onset   Stroke Mother    Heart disease Father    Heart attack Father    Hypertension Father    Heart attack Brother    Emphysema Son    Hypertension Son    Emphysema Daughter     Past Surgical History:  Procedure Laterality Date   ABDOMINAL HYSTERECTOMY     KNEE ARTHROSCOPY WITH MEDIAL MENISECTOMY Left 08/24/2013   Procedure: LEFT KNEE ARTHROSCOPY WITH MEDIAL MENISCECTOMY  ;  Surgeon: Nilda Simmer, MD;  Location: [facility] OR;  Service: Orthopedics;  Laterality: Left;   PARTIAL KNEE ARTHROPLASTY Left 08/24/2013   dr Denton Ar   PARTIAL KNEE ARTHROPLASTY Left 08/24/2013   Procedure: LEFT KNEE ARTHROPLASTY UNI COMPARTMENTAL ;  Surgeon: Nilda Simmer, MD;  Location: [facility] OR;  Service: Orthopedics;  Laterality: Left;  Left knee arthroplasty uni compartment  With left knee scope medial  meniscectomy     REVERSE SHOULDER ARTHROPLASTY Left 02/14/2021   Procedure: LEFT REVERSE SHOULDER ARTHROPLASTY;  Surgeon: Cammy Copa, MD;  [facility];  Service: Orthopedics;  Laterality: Left;   TOTAL VAGINAL HYSTERECTOMY     Social History   Occupational History   Occupation: Retired  Tobacco Use   Smoking status: Former    Packs/day: 1.50    Years: 35.00    Pack years: 52.50    Types: Cigarettes    Quit date: 04/10/2003    Years since quitting: 17.9   Smokeless tobacco: Never  Vaping Use   Vaping Use: Never used  Substance and Sexual Activity   Alcohol use: No   Drug use: No   Sexual activity: Not on file

## 2021-03-02 NOTE — Discharge Summary (Signed)
Physician Discharge Summary      Patient ID: [name] [name] MRN: [id number] DOB/AGE: [birth year] 75 y.o.  Admit date: 02/14/2021 Discharge date: 03/02/2021  Admission Diagnoses:  Active Problems:   S/P reverse total shoulder arthroplasty, left   Arthritis of left shoulder region   Discharge Diagnoses:  Same  Surgeries: Procedure(s): LEFT REVERSE SHOULDER ARTHROPLASTY on 02/14/2021   Consultants:   Discharged Condition: Stable  Hospital Course: LUCCIA [name] is an 75 y.o. 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  They were brought to the operating room on 02/14/2021 and underwent the above named procedures.  Pt awoke from anesthesia without complication and was transferred to the floor. On POD1, patient was feeling well with no CP or SOB or any other complaints aside from shoulder pain.  Was able to ambulate up and down hall without difficulty. Discharged home on POD1.  Pt will f/u with Dr. August Saucer in clinic in ~2 weeks.   Antibiotics given:  Anti-infectives (From admission, onward)    Start     Dose/Rate Route Frequency Ordered Stop   02/14/21 1600  ceFAZolin (ANCEF) IVPB 2g/100 mL premix        2 g 200 mL/hr over 30 Minutes Intravenous Every 8 hours 02/14/21 1241 02/15/21 0609   02/14/21 0839  vancomycin (VANCOCIN) powder  Status:  Discontinued          As needed 02/14/21 0839 02/14/21 1122   02/14/21 0600  ceFAZolin (ANCEF) IVPB 2g/100 mL premix        2 g 200 mL/hr over 30 Minutes Intravenous On call to O.R. 02/14/21 0548 02/14/21 0813   02/14/21 0600  vancomycin (VANCOCIN) IVPB 1000 mg/200 mL premix        1,000 mg 200 mL/hr over 60 Minutes Intravenous On call to O.R. 02/14/21 0539 02/14/21 0727     .  Recent vital signs:  Vitals:   02/15/21 0546 02/15/21 0822  BP: (!) 132/56 (!) 136/55  Pulse: 92 97  Resp: 18 16  Temp: 97.7 F (36.5 C) 98.2 F (36.8 C)  SpO2:  98% 94%    Recent laboratory studies:  Results for orders placed or performed during the hospital encounter of 02/14/21  CBC  Result Value Ref Range   WBC 28.9 (H) 4.0 - 10.5 K/uL   RBC 4.23 3.87 - 5.11 MIL/uL   Hemoglobin 11.4 (L) 12.0 - 15.0 g/dL   HCT 76.7 34.1 - 93.7 %   MCV 85.6 80.0 - 100.0 fL   MCH 27.0 26.0 - 34.0 pg   MCHC 31.5 30.0 - 36.0 g/dL   RDW 90.2 40.9 - 73.5 %   Platelets 309 150 - 400 K/uL   nRBC 0.0 0.0 - 0.2 %  Basic metabolic panel  Result Value Ref Range   Sodium 133 (L) 135 - 145 mmol/L   Potassium 4.3 3.5 - 5.1 mmol/L   Chloride 101 98 - 111 mmol/L   CO2 22 22 - 32 mmol/L   Glucose, Bld 160 (H) 70 - 99 mg/dL   BUN 17 8 - 23 mg/dL   Creatinine, Ser 3.29 0.44 - 1.00 mg/dL   Calcium 9.6 8.9 - 92.4 mg/dL   GFR, Estimated >26 >83 mL/min   Anion gap 10 5 - 15    Discharge Medications:   Allergies as of 02/15/2021       Reactions   Codeine Itching  Medication List     STOP taking these medications    ALPRAZolam 0.5 MG tablet Commonly known as: XANAX   amoxicillin 500 MG tablet Commonly known as: AMOXIL       TAKE these medications    albuterol (2.5 MG/3ML) 0.083% nebulizer solution Commonly known as: PROVENTIL INHALE THE CONTENTS OF 1 VIAL VIA NEBULIZER EVERY 6 HOURS AS NEEDED FOR WHEEZING OR SHORTNESS OF BREATH   amitriptyline 50 MG tablet Commonly known as: ELAVIL Take 1 tablet (50 mg total) by mouth daily. What changed: when to take this   amLODipine 2.5 MG tablet Commonly known as: NORVASC Take 1 tablet (2.5 mg total) by mouth daily. What changed: when to take this   aspirin 81 MG EC tablet Take 1 tablet (81 mg total) by mouth daily. Swallow whole.   Cholecalciferol 125 MCG (5000 UT) Tabs Take 5,000 Units by mouth at bedtime.   docusate sodium 100 MG capsule Commonly known as: COLACE Take 1 capsule (100 mg total) by mouth 2 (two) times daily.   gabapentin 300 MG capsule Commonly known as: NEURONTIN Take 2  capsules (600 mg total) by mouth at bedtime.   HYDROcodone-acetaminophen 5-325 MG tablet Commonly known as: NORCO/VICODIN Take 1 tablet by mouth every 4 (four) hours as needed for moderate pain (pain score 4-6).   levothyroxine 88 MCG tablet Commonly known as: SYNTHROID Take 1 tablet (88 mcg total) by mouth daily. What changed: when to take this   LUBRICATING EYE DROPS OP Place 1 drop into both eyes daily as needed (dry eyes).   methocarbamol 500 MG tablet Commonly known as: ROBAXIN Take 1 tablet (500 mg total) by mouth every 8 (eight) hours as needed for muscle spasms.   metoCLOPramide 5 MG tablet Commonly known as: REGLAN Take 1 tablet (5 mg total) by mouth every 8 (eight) hours as needed for nausea (if ondansetron (ZOFRAN) ineffective.).   naproxen sodium 220 MG tablet Commonly known as: ALEVE Take 660 mg by mouth 2 (two) times daily as needed (pain).        Diagnostic Studies: CT WRIST LEFT WO CONTRAST  Result Date: 02/21/2021 [name] [name]:  Left wrist pain. Evaluate for possible occult fracture. EXAM: CT OF THE LEFT WRIST WITHOUT CONTRAST TECHNIQUE: Multidetector CT imaging was performed according to the standard protocol. Multiplanar CT image reconstructions were also generated. COMPARISON:  Outside radiographs 02/20/2021 FINDINGS: No acute fracture is identified. No bone lesions or osteochondral abnormality. There are mild age related degenerative changes mainly at the [facility] joint of the thumb and at the trapezium articulation with the scaphoid. The intercarpal joint spaces are maintained. No chondrocalcinosis. No obvious joint effusion. Grossly by CT the major tendons and ligaments appear intact. IMPRESSION: 1. No acute fracture is identified. 2. Mild age related degenerative changes mainly at the [facility] joint of the thumb and at the trapezium articulation with the scaphoid. 3. Grossly by CT the major tendons and ligaments appear intact. Electronically Signed   By: [name] [name]  M.D.   On: 02/21/2021 15:29   DG Shoulder Left Port  Result Date: 02/14/2021 [name] [name]:  Left shoulder arthroplasty EXAM: LEFT SHOULDER COMPARISON:  11/08/2020 FINDINGS: There is interval left shoulder arthroplasty. Severe degenerative changes were noted in the left shoulder in the previous study. IMPRESSION: Interval left shoulder arthroplasty. Electronically Signed   By: Ernie Avena M.D.   On: 02/14/2021 13:17   XR Shoulder Left  Result Date: 03/01/2021 AP outlet radiographs left shoulder reviewed.  Reverse shoulder prosthesis  in good position alignment with no complicating features.  Shoulder is located.  XR Shoulder Left  Result Date: 02/20/2021 AP axillary outlet radiographs left shoulder reviewed.  Reverse shoulder prosthesis in good position alignment with no complicating features.  No acute fracture.  Visualized lung fields clear  XR Wrist 2 Views Left  Result Date: 02/20/2021 AP lateral radiographs left wrist reviewed.  No acute fracture.  Soft tissue swelling is present.  Scapholunate angle intact.  Minimal degenerative arthritis within the radiocarpal joint.  Some CMC arthritis is present   Disposition: Discharge disposition: 01-Home or Self Care       Discharge Instructions     Call MD / Call 911   Complete by: As directed    If you experience chest pain or shortness of breath, CALL 911 and be transported to the hospital emergency room.  If you develope a fever above 101 F, pus (white drainage) or increased drainage or redness at the wound, or calf pain, call your surgeon's office.   Constipation Prevention   Complete by: As directed    Drink plenty of fluids.  Prune juice may be helpful.  You may use a stool softener, such as Colace (over the counter) 100 mg twice a day.  Use MiraLax (over the counter) for constipation as needed.   Diet - low sodium heart healthy   Complete by: As directed    Discharge instructions   Complete by: As directed    You may  shower, dressing is waterproof.  Do not bathe or soak the operative shoulder in a tub, pool.  Use the CPM machine 3 times a day for one hour each time.  No lifting with the operative shoulder. Continue use of the sling.  Follow-up with Dr. August Saucer in ~2 weeks on your given appointment date.  We will remove your adhesive bandage at that time.    Dental Antibiotics:  In most cases prophylactic antibiotics for Dental procdeures after total joint surgery are not necessary.  Exceptions are as follows:  1. History of prior total joint infection  2. Severely immunocompromised (Organ Transplant, cancer chemotherapy, Rheumatoid biologic meds such as Humera)  3. Poorly controlled diabetes (A1C &gt; 8.0, blood glucose over 200)  If you have one of these conditions, contact your surgeon for an antibiotic prescription, prior to your dental procedure.   Increase activity slowly as tolerated   Complete by: As directed    Post-operative opioid taper instructions:   Complete by: As directed    POST-OPERATIVE OPIOID TAPER INSTRUCTIONS: It is important to wean off of your opioid medication as soon as possible. If you do not need pain medication after your surgery it is ok to stop day one. Opioids include: Codeine, Hydrocodone(Norco, Vicodin), Oxycodone(Percocet, oxycontin) and hydromorphone amongst others.  Long term and even short term use of opiods can cause: Increased pain response Dependence Constipation Depression Respiratory depression And more.  Withdrawal symptoms can include Flu like symptoms Nausea, vomiting And more Techniques to manage these symptoms Hydrate well Eat regular healthy meals Stay active Use relaxation techniques(deep breathing, meditating, yoga) Do Not substitute Alcohol to help with tapering If you have been on opioids for less than two weeks and do not have pain than it is ok to stop all together.  Plan to wean off of opioids This plan should start within one week  post op of your joint replacement. Maintain the same interval or time between taking each dose and first decrease the dose.  Cut the  total daily intake of opioids by one tablet each day Next start to increase the time between doses. The last dose that should be eliminated is the evening dose.             SignedJulieanne Cotton 03/02/2021, 7:39 AM

## 2021-03-10 DIAGNOSIS — M25512 Pain in left shoulder: Secondary | ICD-10-CM | POA: Diagnosis not present

## 2021-03-13 DIAGNOSIS — M25512 Pain in left shoulder: Secondary | ICD-10-CM | POA: Diagnosis not present

## 2021-03-15 DIAGNOSIS — M25512 Pain in left shoulder: Secondary | ICD-10-CM | POA: Diagnosis not present

## 2021-03-20 DIAGNOSIS — M25512 Pain in left shoulder: Secondary | ICD-10-CM | POA: Diagnosis not present

## 2021-03-22 DIAGNOSIS — M25512 Pain in left shoulder: Secondary | ICD-10-CM | POA: Diagnosis not present

## 2021-03-28 DIAGNOSIS — M25512 Pain in left shoulder: Secondary | ICD-10-CM | POA: Diagnosis not present

## 2021-03-29 ENCOUNTER — Encounter: Payer: Self-pay | Admitting: Orthopedic Surgery

## 2021-03-29 ENCOUNTER — Ambulatory Visit (INDEPENDENT_AMBULATORY_CARE_PROVIDER_SITE_OTHER): Payer: Medicare HMO | Admitting: Surgical

## 2021-03-29 ENCOUNTER — Other Ambulatory Visit: Payer: Self-pay

## 2021-03-29 DIAGNOSIS — Z96612 Presence of left artificial shoulder joint: Secondary | ICD-10-CM

## 2021-04-06 ENCOUNTER — Ambulatory Visit: Payer: Medicare HMO | Admitting: Family Medicine

## 2021-04-08 DIAGNOSIS — I10 Essential (primary) hypertension: Secondary | ICD-10-CM | POA: Diagnosis not present

## 2021-04-08 DIAGNOSIS — R062 Wheezing: Secondary | ICD-10-CM | POA: Diagnosis not present

## 2021-04-08 DIAGNOSIS — R42 Dizziness and giddiness: Secondary | ICD-10-CM | POA: Diagnosis not present

## 2021-04-08 DIAGNOSIS — R519 Headache, unspecified: Secondary | ICD-10-CM | POA: Diagnosis not present

## 2021-04-08 DIAGNOSIS — Z87891 Personal history of nicotine dependence: Secondary | ICD-10-CM | POA: Diagnosis not present

## 2021-04-08 DIAGNOSIS — R0602 Shortness of breath: Secondary | ICD-10-CM | POA: Diagnosis not present

## 2021-04-09 DIAGNOSIS — I10 Essential (primary) hypertension: Secondary | ICD-10-CM | POA: Diagnosis not present

## 2021-04-09 DIAGNOSIS — Z87891 Personal history of nicotine dependence: Secondary | ICD-10-CM | POA: Diagnosis not present

## 2021-04-09 DIAGNOSIS — R42 Dizziness and giddiness: Secondary | ICD-10-CM | POA: Diagnosis not present

## 2021-04-09 DIAGNOSIS — R062 Wheezing: Secondary | ICD-10-CM | POA: Diagnosis not present

## 2021-04-09 DIAGNOSIS — R0602 Shortness of breath: Secondary | ICD-10-CM | POA: Diagnosis not present

## 2021-04-10 ENCOUNTER — Encounter: Payer: Self-pay | Admitting: Orthopedic Surgery

## 2021-04-10 NOTE — Progress Notes (Signed)
Post-Op Visit Note   Patient: [name] [name]           Date of Birth: [birth year]           MRN: [id number] Visit Date: 03/29/2021 PCP: Tommie Sams, DO   Assessment & Plan:  Chief Complaint:  Chief Complaint  Patient presents with   Left Shoulder - Post-op Follow-up   Visit Diagnoses:  1. History of arthroplasty of left shoulder     Plan: Patient is a 76 year old female who presents s/p left reverse shoulder arthroplasty on 02/14/2021.  She reports 0/10 pain.  She is attending physical therapy 2 times per week.  Not taking any medications for pain.  Denies any recent falls or injuries.  No complaints of chest pain, shortness of breath.  On exam, she has range of motion of 30 degrees external rotation, 70 degrees abduction, 130 degrees forward flexion.  Axillary nerve intact with deltoid firing.  Subscap intact with decent strength.  Plan is to continue with physical therapy and follow-up for final check with Dr. August Saucer in 6 weeks.  She will reach out to the office with any concerns in the meantime.  Follow-Up Instructions: No follow-ups on file.   Orders:  No orders of the defined types were placed in this encounter.  No orders of the defined types were placed in this encounter.   Imaging: No results found.  PMFS History: Patient Active Problem List   Diagnosis Date Noted   Arthritis of left shoulder region    S/P reverse total shoulder arthroplasty, left 02/14/2021   Recurrent urinary tract infection 11/08/2020   Bilateral shoulder pain 11/08/2020   Age-related osteoporosis without current pathological fracture 07/22/2019   Upper airway cough syndrome 03/04/2019   DOE (dyspnea on exertion) 03/03/2019   Essential hypertension, benign 01/21/2019   Insomnia 12/18/2018   Vitamin D deficiency disease 12/18/2018   Pulmonary nodule 04/27/2016   Left knee DJD 08/24/2013   DJD (degenerative joint disease) of knee 08/24/2013   Hypothyroidism    Restless leg syndrome     Depression    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     Past Medical History:  Diagnosis Date   COPD (chronic obstructive pulmonary disease) (HCC)    Depression    Dyspnea    Essential hypertension, benign 01/21/2019   History of pneumonia    2010 on ventalator   History of recurrent UTIs    Hypothyroidism    Insomnia 12/18/2018   Left knee DJD    Osteoporosis    Pneumonia    PTSD (post-traumatic stress disorder)    Restless leg syndrome    Sleep apnea     Family History  Problem Relation Age of Onset   Stroke Mother    Heart disease Father    Heart attack Father    Hypertension Father    Heart attack Brother    Emphysema Son    Hypertension Son    Emphysema Daughter     Past Surgical History:  Procedure Laterality Date   ABDOMINAL HYSTERECTOMY     KNEE ARTHROSCOPY WITH MEDIAL MENISECTOMY Left 08/24/2013   Procedure: LEFT KNEE ARTHROSCOPY WITH MEDIAL MENISCECTOMY  ;  Surgeon: Nilda Simmer, MD;  Location: [facility] OR;  Service: Orthopedics;  Laterality: Left;   PARTIAL KNEE ARTHROPLASTY Left 08/24/2013   dr Denton Ar   PARTIAL KNEE ARTHROPLASTY Left 08/24/2013   Procedure: LEFT KNEE ARTHROPLASTY UNI COMPARTMENTAL ;  Surgeon: Nilda Simmer, MD;  Location: [facility] OR;  Service: Orthopedics;  Laterality: Left;  Left knee arthroplasty uni compartment  With left knee scope medial meniscectomy     REVERSE SHOULDER ARTHROPLASTY Left 02/14/2021   Procedure: LEFT REVERSE SHOULDER ARTHROPLASTY;  Surgeon: Cammy Copa, MD;  Location: [facility] OR;  Service: Orthopedics;  Laterality: Left;   TOTAL VAGINAL HYSTERECTOMY     Social History   Occupational History   Occupation: Retired  Tobacco Use   Smoking status: Former    Packs/day: 1.50    Years: 35.00    Pack years: 52.50    Types: Cigarettes    Quit date: 04/10/2003    Years since quitting: 18.0   Smokeless tobacco: Never  Vaping Use   Vaping Use: Never used  Substance and Sexual Activity   Alcohol use: No   Drug use: No   Sexual activity: Not on  file

## 2021-04-12 ENCOUNTER — Ambulatory Visit (INDEPENDENT_AMBULATORY_CARE_PROVIDER_SITE_OTHER): Payer: Medicare HMO | Admitting: Family Medicine

## 2021-04-12 ENCOUNTER — Other Ambulatory Visit: Payer: Self-pay

## 2021-04-12 DIAGNOSIS — E039 Hypothyroidism, unspecified: Secondary | ICD-10-CM

## 2021-04-12 DIAGNOSIS — R519 Headache, unspecified: Secondary | ICD-10-CM | POA: Insufficient documentation

## 2021-04-12 DIAGNOSIS — I10 Essential (primary) hypertension: Secondary | ICD-10-CM | POA: Diagnosis not present

## 2021-04-12 MED ORDER — LEVOTHYROXINE SODIUM 88 MCG PO TABS: 88.0000 ug | ORAL_TABLET | Freq: Every day | ORAL | 3 refills | Status: DC

## 2021-04-12 MED ORDER — AMLODIPINE BESYLATE 5 MG PO TABS: 5.0000 mg | ORAL_TABLET | Freq: Every day | ORAL | 3 refills | Status: DC

## 2021-04-12 MED ORDER — PROPRANOLOL HCL ER 60 MG PO CP24: 60.0000 mg | ORAL_CAPSULE | Freq: Every day | ORAL | 0 refills | Status: DC

## 2021-04-12 NOTE — Progress Notes (Signed)
Subjective:  Patient ID: [name] [name], female    DOB: [birth year]  Age: 76 y.o. MRN: [id number]  CC: Chief Complaint  Patient presents with   Follow-up   Hypertension   Hypothyroidism    Bone density. Went to ER at [facility] on 04/08/21 for blood pressure problems.    HPI:  76 year old female presents for follow-up.  Patient recently underwent shoulder surgery and has been doing well from this perspective.  However, patient has not been feeling well recently.  Was recently in the hospital for evaluation of elevated blood pressures, dizziness, and headaches.  She had a thorough evaluation including CT imaging of the head which was negative.  Patient reports that she continues to have ongoing headaches which are now occurring more frequently.  Has been worse over the past few weeks.  Headaches start in the frontal region and then lead to nausea and some blurry vision.  She also describes associated dizziness as if things are spinning/moving.  No reported vision changes when she is not experiencing a headache.  Blood pressure elevated today and has been elevated as of late. 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      Social Hx   Social History   Socioeconomic History   Marital status: Married    Spouse name: Not on file   Number of children: Not on file   Years of education: Not on file   Highest education level: High  school graduate  Occupational History   Occupation: Retired  Tobacco Use   Smoking status: Former    Packs/day: 1.50    Years: 35.00    Pack years: 52.50    Types: Cigarettes    Quit date: 04/10/2003    Years since quitting: 18.0   Smokeless tobacco: Never  Vaping Use   Vaping Use: Never used  Substance and Sexual Activity   Alcohol use: No   Drug use: No   Sexual activity: Not on file  Other Topics Concern   Not on file  Social History Narrative   Married 22 years in 2nd marriage.First marriage lasted 20 years.Retired,now a Education officer, environmental.   Social Determinants of Health   Financial Resource Strain: Low Risk    Difficulty of Paying Living Expenses: Not hard at all  Food Insecurity: No Food Insecurity   Worried About Programme researcher, broadcasting/film/video in the Last Year: Never true   Ran Out of Food in the Last Year: Never true  Transportation Needs: No Transportation Needs   Lack of Transportation (Medical): No   Lack of Transportation (Non-Medical): No  Physical Activity: Inactive   Days of Exercise per Week: 0 days   Minutes of Exercise per Session: 0 min  Stress: No Stress Concern Present   Feeling of Stress : Not at all  Social Connections: Moderately Isolated   Frequency of Communication with Friends and Family: More than three times a  week   Frequency of Social Gatherings with Friends and Family: More than three times a week   Attends Religious Services: Never   Database administrator or Organizations: No   Attends Engineer, structural: Never   Marital Status: Married    Review of Systems Per HPI  Objective:  BP (!) 157/84    Pulse 83    Temp 98.3 F (36.8 C) (Oral)    Ht 4\' 11"  (1.499 m)    Wt 143 lb (64.9 kg)    SpO2 96%    BMI 28.88 kg/m   BP/Weight 04/12/2021 02/15/2021 02/14/2021  Systolic BP 157 136 -  Diastolic BP 84 55 -  Wt. (Lbs) 143 - 141  BMI 28.88 - 28.48    Physical Exam Vitals and nursing note reviewed.  Constitutional:      General: She is not in  acute distress.    Appearance: Normal appearance. She is not ill-appearing.  HENT:     Head: Normocephalic and atraumatic.  Eyes:     General:        Right eye: No discharge.        Left eye: No discharge.     Conjunctiva/sclera: Conjunctivae normal.  Cardiovascular:     Rate and Rhythm: Normal rate and regular rhythm.     Heart sounds: No murmur heard. Pulmonary:     Effort: Pulmonary effort is normal.     Breath sounds: Normal breath sounds. No wheezing, rhonchi or rales.  Neurological:     General: No focal deficit present.     Mental Status: She is alert.  Psychiatric:        Mood and Affect: Mood normal.        Behavior: Behavior normal.    Lab Results  Component Value Date   WBC 28.9 (H) 02/15/2021   HGB 11.4 (L) 02/15/2021   HCT 36.2 02/15/2021   PLT 309 02/15/2021   GLUCOSE 160 (H) 02/15/2021   CHOL 181 10/05/2020   TRIG 149 10/05/2020   HDL 43 (L) 10/05/2020   LDLCALC 112 (H) 10/05/2020   ALT 23 10/05/2020   AST 23 10/05/2020   NA 133 (L) 02/15/2021   K 4.3 02/15/2021   CL 101 02/15/2021   CREATININE 0.86 02/15/2021   BUN 17 02/15/2021   CO2 22 02/15/2021   TSH 1.810 01/23/2021   INR 0.90 08/14/2013     Assessment & Plan:   Problem List Items Addressed This Visit       Cardiovascular and Mediastinum   Essential hypertension, benign    Uncontrolled.  Increasing amlodipine.      Relevant Medications   amLODipine (NORVASC) 5 MG tablet   propranolol ER (INDERAL LA) 60 MG 24 hr capsule     Endocrine   Hypothyroidism   Relevant Medications   levothyroxine (SYNTHROID) 88 MCG tablet   propranolol ER (INDERAL LA) 60 MG 24 hr capsule     Other   New onset of headaches after age 66    Has had recent CT imaging which was negative. History is concerning given the fact that she is experiencing new onset headaches at her age.  But the fact that her CT imaging was negative is reassuring.  Concern for possible migraine.  Trial of Inderal.  Patient will  contact me in approximately 1 week to let me know how she is doing.  I have a low threshold to refer her to neurology.      Relevant Medications  amLODipine (NORVASC) 5 MG tablet   propranolol ER (INDERAL LA) 60 MG 24 hr capsule    Meds ordered this encounter  Medications   amLODipine (NORVASC) 5 MG tablet    Sig: Take 1 tablet (5 mg total) by mouth daily.    Dispense:  90 tablet    Refill:  3   levothyroxine (SYNTHROID) 88 MCG tablet    Sig: Take 1 tablet (88 mcg total) by mouth daily.    Dispense:  90 tablet    Refill:  3   propranolol ER (INDERAL LA) 60 MG 24 hr capsule    Sig: Take 1 capsule (60 mg total) by mouth daily.    Dispense:  90 capsule    Refill:  0    Follow-up:  Return in about 3 months (around 07/11/2021).  Everlene OtherJayce Dondrell Loudermilk DO [facility]

## 2021-04-12 NOTE — Assessment & Plan Note (Signed)
Has had recent CT imaging which was negative. History is concerning given the fact that she is experiencing new onset headaches at her age.  But the fact that her CT imaging was negative is reassuring.  Concern for possible migraine.  Trial of Inderal.  Patient will contact me in approximately 1 week to let me know how she is doing.  I have a low threshold to refer her to neurology.

## 2021-04-12 NOTE — Assessment & Plan Note (Signed)
Uncontrolled.  Increasing amlodipine. 

## 2021-04-12 NOTE — Patient Instructions (Signed)
I have increased the Amlodipine to 5 mg daily.   Start the inderal.  Call or message in the next week or so and let me know how you're doing.  If you worsen, please let me know.  Take care  Dr. Adriana Simas

## 2021-04-19 ENCOUNTER — Encounter: Payer: Self-pay | Admitting: Internal Medicine

## 2021-04-20 ENCOUNTER — Telehealth: Payer: Self-pay | Admitting: Family Medicine

## 2021-04-20 NOTE — Telephone Encounter (Signed)
Patient called states that she was supposed to call in and let Dr. Adriana Simas know that her blood pressure has been reading 122/72 and that her vision issues has went away since they increased her medication.  CB# [phone]

## 2021-04-20 NOTE — Telephone Encounter (Signed)
Coral Spikes, DO    Thank you for letting me know.

## 2021-05-09 ENCOUNTER — Other Ambulatory Visit: Payer: Self-pay

## 2021-05-09 DIAGNOSIS — R519 Headache, unspecified: Secondary | ICD-10-CM

## 2021-05-09 MED ORDER — PROPRANOLOL HCL ER 60 MG PO CP24: 60.0000 mg | ORAL_CAPSULE | Freq: Every day | ORAL | 0 refills | Status: DC

## 2021-05-12 ENCOUNTER — Encounter: Payer: Self-pay | Admitting: Orthopedic Surgery

## 2021-05-12 ENCOUNTER — Other Ambulatory Visit: Payer: Self-pay

## 2021-05-12 ENCOUNTER — Ambulatory Visit (INDEPENDENT_AMBULATORY_CARE_PROVIDER_SITE_OTHER): Payer: Medicare HMO | Admitting: Orthopedic Surgery

## 2021-05-12 ENCOUNTER — Ambulatory Visit: Payer: Medicare HMO | Admitting: Surgical

## 2021-05-12 DIAGNOSIS — Z96612 Presence of left artificial shoulder joint: Secondary | ICD-10-CM

## 2021-05-12 NOTE — Progress Notes (Signed)
Post-Op Visit Note   Patient: [name] [name]           Date of Birth: 1945/04/20           MRN: [id number] Visit Date: 05/12/2021 PCP: Tommie Sams, DO   Assessment & Plan:  Chief Complaint:  Chief Complaint  Patient presents with   Left Shoulder - Follow-up   Visit Diagnoses:  1. History of arthroplasty of left shoulder     Plan: Patient is 76 y.o. Female s/p left reverse shoulder arthroplasty on 02/14/2021.  Patient doing well.  No pain.  Not having to take any medication.  She does not wake with pain to her shoulder.  She is adhering with her lifting restriction.  No instability the shoulder.  She is very happy with her result.  On exam she has 35 degrees external rotation, 90 degrees abduction, 150 degrees forward flexion.  Incision well-healed without evidence of infection or dehiscence.  Axillary nerve is intact with deltoid firing.  Subscap strength rated 5/5 today.  Plan is to follow-up with the office as needed.  Patient was counseled on dental antibiotic prophylaxis.  Follow-Up Instructions: No follow-ups on file.   Orders:  No orders of the defined types were placed in this encounter.  No orders of the defined types were placed in this encounter.   Imaging: No results found.  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     Past Medical History:  Diagnosis Date   COPD (chronic obstructive  pulmonary disease) (HCC)    Depression    Dyspnea    Essential hypertension, benign 01/21/2019   History of pneumonia    2010 on ventalator   History of recurrent UTIs    Hypothyroidism    Insomnia 12/18/2018   Left knee DJD    Osteoporosis    Pneumonia    PTSD (post-traumatic stress disorder)    Restless leg syndrome    Sleep apnea     Family History  Problem Relation Age of Onset   Stroke Mother    Heart disease Father    Heart attack Father    Hypertension Father    Heart attack Brother    Emphysema Son    Hypertension Son    Emphysema Daughter     Past Surgical History:  Procedure Laterality Date   ABDOMINAL HYSTERECTOMY     KNEE ARTHROSCOPY WITH MEDIAL MENISECTOMY Left 08/24/2013   Procedure: LEFT KNEE ARTHROSCOPY WITH MEDIAL MENISCECTOMY  ;  Surgeon: Nilda Simmer, MD;  Location: [facility] OR;  Service: Orthopedics;  Laterality: Left;   PARTIAL KNEE ARTHROPLASTY Left 08/24/2013   dr Denton Ar   PARTIAL KNEE ARTHROPLASTY Left 08/24/2013   Procedure: LEFT KNEE ARTHROPLASTY UNI COMPARTMENTAL ;  Surgeon: Nilda Simmer, MD;  Location: [facility] OR;  Service: Orthopedics;  Laterality: Left;  Left knee arthroplasty uni compartment  With left knee scope medial  meniscectomy     REVERSE SHOULDER ARTHROPLASTY Left 02/14/2021   Procedure: LEFT REVERSE SHOULDER ARTHROPLASTY;  Surgeon: Cammy Copa, MD;  Location: [facility];  Service: Orthopedics;  Laterality: Left;   TOTAL VAGINAL HYSTERECTOMY     Social History   Occupational History   Occupation: Retired  Tobacco Use   Smoking status: Former    Packs/day: 1.50    Years: 35.00    Pack years: 52.50    Types: Cigarettes    Quit date: 04/10/2003    Years since quitting: 18.1   Smokeless tobacco: Never  Vaping Use   Vaping Use: Never used  Substance and Sexual Activity   Alcohol use: No   Drug use: No   Sexual activity: Not on file

## 2021-05-25 ENCOUNTER — Ambulatory Visit (INDEPENDENT_AMBULATORY_CARE_PROVIDER_SITE_OTHER): Payer: Medicare HMO | Admitting: Family Medicine

## 2021-05-25 ENCOUNTER — Other Ambulatory Visit: Payer: Self-pay

## 2021-05-25 DIAGNOSIS — J441 Chronic obstructive pulmonary disease with (acute) exacerbation: Secondary | ICD-10-CM | POA: Insufficient documentation

## 2021-05-25 MED ORDER — DOXYCYCLINE HYCLATE 100 MG PO TABS: 100.0000 mg | ORAL_TABLET | Freq: Two times a day (BID) | ORAL | 0 refills | Status: DC

## 2021-05-25 MED ORDER — PREDNISONE 50 MG PO TABS: ORAL_TABLET | ORAL | 0 refills | Status: DC

## 2021-05-25 NOTE — Assessment & Plan Note (Signed)
Patient with persistent symptoms.  Patient has a history of COPD per pulmonology evaluation previously.  In this setting, I believe that she is experiencing exacerbation.  Treating with prednisone and Doxy.

## 2021-05-25 NOTE — Progress Notes (Signed)
Subjective:  Patient ID: [name] [name], female    DOB: 20-Aug-1945  Age: 76 y.o. MRN: [id number]  CC: Chief Complaint  Patient presents with   Cough    Productive with clear mucus   Nasal Congestion    Clear runny nose   Fever    Very little fever    HPI:  76 year old female presents for evaluation the above.  Patient reports that she has not been feeling well for the past 2 to 3 weeks.  Started initially with upper respiratory symptoms and then improved.  However has subsequently worsened.  She is having significant cough.  This is quite troublesome for her.  No fever.  She does report some shortness of breath and wheezing.  She has been using her albuterol nebulizer with some improvement.  No chest pain.  No other associated symptoms.  No other complaints.  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      Social Hx   Social History   Socioeconomic History   Marital status: Married    Spouse name: Not on file   Number of children: Not on file   Years of education: Not on file   Highest education level: High school graduate  Occupational History   Occupation: Retired  Tobacco Use   Smoking status: Former    Packs/day: 1.50    Years: 35.00    Pack years: 52.50    Types: Cigarettes    Quit date: 04/10/2003    Years since quitting: 18.1   Smokeless tobacco: Never  Vaping Use    Vaping Use: Never used  Substance and Sexual Activity   Alcohol use: No   Drug use: No   Sexual activity: Not on file  Other Topics Concern   Not on file  Social History Narrative   Married 22 years in 2nd marriage.First marriage lasted 20 years.Retired,now a Education officer, environmental.   Social Determinants of Health   Financial Resource Strain: Low Risk    Difficulty of Paying Living Expenses: Not hard at all  Food Insecurity: No Food Insecurity   Worried About Programme researcher, broadcasting/film/video in the Last Year: Never true   Ran Out of Food in the Last Year: Never true  Transportation Needs: No Transportation Needs   Lack of Transportation (Medical): No   Lack of Transportation (Non-Medical): No  Physical Activity: Inactive   Days of Exercise per Week: 0 days   Minutes of Exercise per Session: 0 min  Stress: No Stress Concern Present   Feeling of Stress : Not at all  Social Connections: Moderately Isolated   Frequency of Communication with Friends and Family: More than three times a week   Frequency of Social Gatherings with Friends and Family: More than three times a week   Attends Religious Services: Never   Database administrator or Organizations: No   Attends [employer]  or Organization Meetings: Never   Marital Status: Married    Review of Systems Per HPI  Objective:  BP 132/75    Pulse 71    Temp 97.9 F (36.6 C) (Oral)    Wt 143 lb (64.9 kg)    SpO2 100%    BMI 28.88 kg/m   BP/Weight 05/25/2021 04/12/2021 02/15/2021  Systolic BP 132 157 136  Diastolic BP 75 84 55  Wt. (Lbs) 143 143 -  BMI 28.88 28.88 -    Physical Exam Vitals and nursing note reviewed.  Constitutional:      General: She is not in acute distress.    Appearance: Normal appearance. She is not ill-appearing.  Eyes:     General:        Right eye: No discharge.        Left eye: No discharge.     Conjunctiva/sclera: Conjunctivae normal.  Cardiovascular:     Rate and Rhythm: Normal rate and regular rhythm.  Pulmonary:     Effort:  Pulmonary effort is normal.     Breath sounds: Normal breath sounds. No wheezing, rhonchi or rales.  Neurological:     Mental Status: She is alert.  Psychiatric:        Mood and Affect: Mood normal.        Behavior: Behavior normal.    Lab Results  Component Value Date   WBC 28.9 (H) 02/15/2021   HGB 11.4 (L) 02/15/2021   HCT 36.2 02/15/2021   PLT 309 02/15/2021   GLUCOSE 160 (H) 02/15/2021   CHOL 181 10/05/2020   TRIG 149 10/05/2020   HDL 43 (L) 10/05/2020   LDLCALC 112 (H) 10/05/2020   ALT 23 10/05/2020   AST 23 10/05/2020   NA 133 (L) 02/15/2021   K 4.3 02/15/2021   CL 101 02/15/2021   CREATININE 0.86 02/15/2021   BUN 17 02/15/2021   CO2 22 02/15/2021   TSH 1.810 01/23/2021   INR 0.90 08/14/2013     Assessment & Plan:   Problem List Items Addressed This Visit       Respiratory   COPD exacerbation (HCC)    Patient with persistent symptoms.  Patient has a history of COPD per pulmonology evaluation previously.  In this setting, I believe that she is experiencing exacerbation.  Treating with prednisone and Doxy.      Relevant Medications   predniSONE (DELTASONE) 50 MG tablet    Meds ordered this encounter  Medications   doxycycline (VIBRA-TABS) 100 MG tablet    Sig: Take 1 tablet (100 mg total) by mouth 2 (two) times daily.    Dispense:  14 tablet    Refill:  0   predniSONE (DELTASONE) 50 MG tablet    Sig: 1 tablet daily x 5 days    Dispense:  5 tablet    Refill:  0     Rolande Moe DO [facility]

## 2021-06-02 ENCOUNTER — Ambulatory Visit: Payer: Medicare HMO | Admitting: Gastroenterology

## 2021-06-08 ENCOUNTER — Ambulatory Visit (HOSPITAL_COMMUNITY)
Admission: EM | Admit: 2021-06-08 | Discharge: 2021-06-08 | Disposition: A | Discharge status: Home / Self Care | Payer: Medicare HMO

## 2021-06-08 ENCOUNTER — Other Ambulatory Visit: Payer: Self-pay

## 2021-06-08 DIAGNOSIS — J441 Chronic obstructive pulmonary disease with (acute) exacerbation: Secondary | ICD-10-CM | POA: Diagnosis not present

## 2021-06-08 DIAGNOSIS — R059 Cough, unspecified: Secondary | ICD-10-CM | POA: Diagnosis not present

## 2021-06-16 ENCOUNTER — Other Ambulatory Visit: Payer: Self-pay | Admitting: *Deleted

## 2021-06-16 ENCOUNTER — Telehealth: Payer: Self-pay | Admitting: Family Medicine

## 2021-06-16 DIAGNOSIS — E039 Hypothyroidism, unspecified: Secondary | ICD-10-CM

## 2021-06-16 MED ORDER — LEVOTHYROXINE SODIUM 88 MCG PO TABS: 88.0000 ug | ORAL_TABLET | Freq: Every day | ORAL | 1 refills | Status: DC

## 2021-06-16 NOTE — Telephone Encounter (Signed)
LM on voicemail that medication was sent to pharmacy. ?

## 2021-06-16 NOTE — Telephone Encounter (Signed)
Patient needs a 30 day supply of her levothyroxine (SYNTHROID) 88 MCG tablet [id number]  ?  Order Details ?Dose: 88 mcg Route: Oral Frequency: Daily  ?Dispense Quantity: 90 tablet Refills: 3   ?     ?Sig: Take 1 tablet (88 mcg total) by mouth daily.  ?Called into [facility] in [place]. She is out and the mail order won't get the medication to her in time. ? ?CB# [phone] ?

## 2021-06-19 ENCOUNTER — Ambulatory Visit (INDEPENDENT_AMBULATORY_CARE_PROVIDER_SITE_OTHER): Payer: Medicare HMO | Admitting: Family Medicine

## 2021-06-19 ENCOUNTER — Other Ambulatory Visit: Payer: Self-pay

## 2021-06-19 VITALS — BP 127/76 | HR 65 | Temp 97.3°F | Ht 59.0 in | Wt 144.0 lb

## 2021-06-19 DIAGNOSIS — I10 Essential (primary) hypertension: Secondary | ICD-10-CM | POA: Diagnosis not present

## 2021-06-19 DIAGNOSIS — F3289 Other specified depressive episodes: Secondary | ICD-10-CM | POA: Diagnosis not present

## 2021-06-19 DIAGNOSIS — J449 Chronic obstructive pulmonary disease, unspecified: Secondary | ICD-10-CM

## 2021-06-19 DIAGNOSIS — R739 Hyperglycemia, unspecified: Secondary | ICD-10-CM | POA: Diagnosis not present

## 2021-06-19 DIAGNOSIS — Z862 Personal history of diseases of the blood and blood-forming organs and certain disorders involving the immune mechanism: Secondary | ICD-10-CM | POA: Diagnosis not present

## 2021-06-19 DIAGNOSIS — E039 Hypothyroidism, unspecified: Secondary | ICD-10-CM | POA: Diagnosis not present

## 2021-06-19 DIAGNOSIS — E785 Hyperlipidemia, unspecified: Secondary | ICD-10-CM

## 2021-06-19 NOTE — Patient Instructions (Signed)
BP looks great. ? ?Labs today. ? ?Follow up in 3 months (sooner if needed). ? ?Take care ? ?Dr. Lacinda Axon  ?

## 2021-06-19 NOTE — Assessment & Plan Note (Signed)
TSH ordered.  Continue current dosing of Synthroid. ?

## 2021-06-19 NOTE — Progress Notes (Signed)
? ?Subjective:  ?Patient ID: [name] [name], female    DOB: 31-Oct-1945  Age: 76 y.o. MRN: [id number] ? ?CC: ?Chief Complaint  ?Patient presents with  ? COPD  ?  Follow up refill inhaler to [facility]  ? Hypothyroidism  ?  Had been taking 75 mcg till last night started 88 mcg  ? Hypertension  ? ? ?HPI: ? ?76 year old female with COPD, hypertension, hypothyroidism, depression presents for follow-up. ? ?Patient's hypertension is well controlled.  She is compliant with amlodipine.  She is also taking propranolol for headache prophylaxis. ? ?Patient recently been struggling regarding COPD.  I treated her for COPD exacerbation on 2/16.  She was treated again on 3/2.  She states that she finally feels like she is doing better.  No current shortness of breath. ? ?Patient recently ran out of her Synthroid and has recently started back.  ? ?Patient reports recent feeling of generalized weakness.  She states that she feels like her legs are weak.  Denies shortness of breath or chest pain.  She states that she also feels unmotivated.  She is concerned that she may be having issues with depression.  She is currently on amitriptyline but uses this for sleep. ? ?Patient Active Problem List  ? Diagnosis Date Noted  ? New onset of headaches after age 35 04/12/2021  ? 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 Recurrent urinary tract infection 11/08/2020  ? Age-related osteoporosis without current pathological fracture 07/22/2019  ? Upper airway cough syndrome 03/04/2019  ? DOE (dyspnea on exertion) 03/03/2019  ? Essential hypertension, benign 01/21/2019  ? Insomnia 12/18/2018  ? Pulmonary nodule 04/27/2016  ? Hypothyroidism   ? Restless leg syndrome   ? Depression   ? 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 ? ?Social Hx   ?Social History  ? ?Socioeconomic History  ? Marital status: Married  ?  Spouse name: Not on file  ? Number of children: Not on file  ? Years of education: Not on file  ? Highest education level: High school  graduate  ?Occupational History  ? Occupation: Retired  ?Tobacco Use  ? Smoking status: Former  ?  Packs/day: 1.50  ?  Years: 35.00  ?  Pack years: 52.50  ?  Types: Cigarettes  ?  Quit date: 04/10/2003  ?  Years since quitting: 18.2  ? Smokeless tobacco: Never  ?Vaping Use  ? Vaping Use: Never used  ?Substance and Sexual Activity  ? Alcohol use: No  ? Drug use: No  ? Sexual activity: Not on file  ?Other Topics Concern  ? Not on file  ?Social History Narrative  ? Married 22 years in 2nd marriage.First marriage lasted 106 years.Retired,now a Theme park manager.  ? ?Social Determinants of Health  ? ?Financial Resource Strain: Low Risk   ? Difficulty of Paying Living Expenses: Not hard at all  ?Food Insecurity: No Food Insecurity  ? Worried About Charity fundraiser in the Last Year: Never true  ? Ran Out of Food in the Last Year: Never true  ?Transportation Needs: No Transportation Needs  ? Lack of Transportation (Medical): No  ? Lack of Transportation (Non-Medical): No  ?Physical Activity: Inactive  ? Days of Exercise per Week: 0 days  ? Minutes of Exercise per Session: 0 min  ?Stress: No Stress Concern Present  ? Feeling of Stress : Not at all  ?Social Connections: Moderately Isolated  ? Frequency of Communication with Friends and Family: More than  three times a week  ? Frequency of Social Gatherings with Friends and Family: More than three times a week  ? Attends Religious Services: Never  ? Active Member of Clubs or Organizations: No  ? Attends Archivist Meetings: Never  ? Marital Status: Married  ? ? ?Review of Systems ?Per HPI ? ?Objective:  ?BP 127/76   Pulse 65   Temp (!) 97.3 ?F (36.3 ?C)   Ht 4' 11"  (1.499 m)   Wt 144 lb (65.3 kg)   SpO2 99%   BMI 29.08 kg/m?  ? ?BP/Weight 06/19/2021 05/25/2021 04/12/2021  ?Systolic BP 354 562 563  ?Diastolic BP 76 75 84  ?Wt. (Lbs) 144 143 143  ?BMI 29.08 28.88 28.88  ? ? ?Physical Exam ?Vitals and nursing note reviewed.  ?Constitutional:   ?   General: She is not in acute  distress. ?   Appearance: Normal appearance.  ?HENT:  ?   Head: Normocephalic and atraumatic.  ?Eyes:  ?   General:     ?   Right eye: No discharge.     ?   Left eye: No discharge.  ?   Conjunctiva/sclera: Conjunctivae normal.  ?Cardiovascular:  ?   Rate and Rhythm: Normal rate and regular rhythm.  ?Pulmonary:  ?   Effort: Pulmonary effort is normal.  ?   Breath sounds: Normal breath sounds.  ?Neurological:  ?   Mental Status: She is alert.  ?Psychiatric:     ?   Mood and Affect: Mood normal.     ?   Behavior: Behavior normal.  ? ? ?Lab Results  ?Component Value Date  ? WBC 28.9 (H) 02/15/2021  ? HGB 11.4 (L) 02/15/2021  ? HCT 36.2 02/15/2021  ? PLT 309 02/15/2021  ? GLUCOSE 160 (H) 02/15/2021  ? CHOL 181 10/05/2020  ? TRIG 149 10/05/2020  ? HDL 43 (L) 10/05/2020  ? Bowie 112 (H) 10/05/2020  ? ALT 23 10/05/2020  ? AST 23 10/05/2020  ? NA 133 (L) 02/15/2021  ? K 4.3 02/15/2021  ? CL 101 02/15/2021  ? CREATININE 0.86 02/15/2021  ? BUN 17 02/15/2021  ? CO2 22 02/15/2021  ? TSH 1.810 01/23/2021  ? INR 0.90 08/14/2013  ? ? ? ?Assessment & Plan:  ? ?Problem List Items Addressed This Visit   ? ?  ? Cardiovascular and Mediastinum  ? Essential hypertension, benign - Primary  ?  BP well controlled.  Continue amlodipine. ?  ?  ? Relevant Orders  ? CMP14+EGFR  ?  ? Respiratory  ? 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  Stable currently.  Continue albuterol as needed. ?  ?  ? Relevant Medications  ? albuterol (VENTOLIN HFA) 108 (90 Base) MCG/ACT inhaler  ?  ? Endocrine  ? Hypothyroidism  ?  TSH ordered.  Continue current dosing of Synthroid. ?  ?  ? Relevant Orders  ? TSH  ?  ? Other  ? Depression  ?  Suspect that patient is experiencing some issues with depression.  Labs for further evaluation today given reports of generalized weakness.  Will discuss further after labs return. ?  ?  ? ?Other Visit Diagnoses   ? ? Hyperlipidemia, unspecified hyperlipidemia type      ? Relevant Orders  ? Lipid panel  ? History of anemia      ? Relevant Orders   ? CBC  ? Blood glucose elevated      ? Relevant Orders  ? Hemoglobin A1c  ? ?  ? ?  Follow-up:  Return in about 3 months (around 09/19/2021). ? ?Thersa Salt DO ?[facility] ? ?

## 2021-06-19 NOTE — Assessment & Plan Note (Signed)
Stable currently.  Continue albuterol as needed. ?

## 2021-06-19 NOTE — Assessment & Plan Note (Signed)
Suspect that patient is experiencing some issues with depression.  Labs for further evaluation today given reports of generalized weakness.  Will discuss further after labs return. ?

## 2021-06-19 NOTE — Assessment & Plan Note (Signed)
BP well controlled.  Continue amlodipine. ?

## 2021-06-20 ENCOUNTER — Other Ambulatory Visit (HOSPITAL_COMMUNITY)
Admission: RE | Admit: 2021-06-20 | Discharge: 2021-06-20 | Disposition: A | Discharge status: Home / Self Care | Payer: Medicare HMO | Source: Ambulatory Visit | Attending: Family Medicine | Admitting: Family Medicine

## 2021-06-20 ENCOUNTER — Ambulatory Visit (HOSPITAL_COMMUNITY)
Admission: RE | Admit: 2021-06-20 | Discharge: 2021-06-20 | Disposition: A | Discharge status: Home / Self Care | Payer: Medicare HMO | Source: Ambulatory Visit | Attending: Family Medicine | Admitting: Family Medicine

## 2021-06-20 ENCOUNTER — Ambulatory Visit (INDEPENDENT_AMBULATORY_CARE_PROVIDER_SITE_OTHER): Payer: Medicare HMO | Admitting: Family Medicine

## 2021-06-20 ENCOUNTER — Other Ambulatory Visit: Payer: Self-pay

## 2021-06-20 ENCOUNTER — Encounter: Payer: Self-pay | Admitting: Family Medicine

## 2021-06-20 VITALS — BP 135/76 | HR 66 | Temp 97.2°F | Ht 59.0 in | Wt 145.0 lb

## 2021-06-20 DIAGNOSIS — R0602 Shortness of breath: Secondary | ICD-10-CM

## 2021-06-20 DIAGNOSIS — D72829 Elevated white blood cell count, unspecified: Secondary | ICD-10-CM | POA: Insufficient documentation

## 2021-06-20 DIAGNOSIS — E875 Hyperkalemia: Secondary | ICD-10-CM | POA: Insufficient documentation

## 2021-06-20 DIAGNOSIS — J432 Centrilobular emphysema: Secondary | ICD-10-CM | POA: Diagnosis not present

## 2021-06-20 DIAGNOSIS — R911 Solitary pulmonary nodule: Secondary | ICD-10-CM | POA: Diagnosis not present

## 2021-06-20 LAB — CMP14+EGFR
ALT: 21 IU/L (ref 0–32)
AST: 21 IU/L (ref 0–40)
Albumin/Globulin Ratio: 1.7 (ref 1.2–2.2)
Albumin: 4.1 g/dL (ref 3.7–4.7)
Alkaline Phosphatase: 156 IU/L — ABNORMAL HIGH (ref 44–121)
BUN/Creatinine Ratio: 20 (ref 12–28)
BUN: 18 mg/dL (ref 8–27)
Bilirubin Total: <0.2 mg/dL (ref 0.0–1.2)
CO2: 26 mmol/L (ref 20–29)
Calcium: 10.4 mg/dL — ABNORMAL HIGH (ref 8.7–10.3)
Chloride: 104 mmol/L (ref 96–106)
Creatinine, Ser: 0.9 mg/dL (ref 0.57–1.00)
Globulin, Total: 2.4 g/dL (ref 1.5–4.5)
Glucose: 76 mg/dL (ref 70–99)
Potassium: 5.7 mmol/L — ABNORMAL HIGH (ref 3.5–5.2)
Sodium: 141 mmol/L (ref 134–144)
Total Protein: 6.5 g/dL (ref 6.0–8.5)
eGFR: 68 mL/min/{1.73_m2} (ref >59)

## 2021-06-20 LAB — BASIC METABOLIC PANEL
Anion gap: 8 (ref 5–15)
BUN: 28 mg/dL — ABNORMAL HIGH (ref 8–23)
CO2: 27 mmol/L (ref 22–32)
Calcium: 8.9 mg/dL (ref 8.9–10.3)
Chloride: 103 mmol/L (ref 98–111)
Creatinine, Ser: 0.98 mg/dL (ref 0.44–1.00)
GFR, Estimated: >60 mL/min (ref >60)
Glucose, Bld: 83 mg/dL (ref 70–99)
Potassium: 4.7 mmol/L (ref 3.5–5.1)
Sodium: 138 mmol/L (ref 135–145)

## 2021-06-20 LAB — LIPID PANEL
Chol/HDL Ratio: 3.5 ratio (ref 0.0–4.4)
Cholesterol, Total: 197 mg/dL (ref 100–199)
HDL: 56 mg/dL (ref >39)
LDL Chol Calc (NIH): 123 mg/dL — ABNORMAL HIGH (ref 0–99)
Triglycerides: 101 mg/dL (ref 0–149)
VLDL Cholesterol Cal: 18 mg/dL (ref 5–40)

## 2021-06-20 LAB — CBC WITH DIFFERENTIAL/PLATELET
Abs Immature Granulocytes: 0.1 10*3/uL — ABNORMAL HIGH (ref 0.00–0.07)
Basophils Absolute: 0.1 10*3/uL (ref 0.0–0.1)
Basophils Relative: 0 %
Eosinophils Absolute: 0.4 10*3/uL (ref 0.0–0.5)
Eosinophils Relative: 3 %
HCT: 45.6 % (ref 36.0–46.0)
Hemoglobin: 13 g/dL (ref 12.0–15.0)
Immature Granulocytes: 1 %
Lymphocytes Relative: 22 %
Lymphs Abs: 3.1 10*3/uL (ref 0.7–4.0)
MCH: 24.7 pg — ABNORMAL LOW (ref 26.0–34.0)
MCHC: 28.5 g/dL — ABNORMAL LOW (ref 30.0–36.0)
MCV: 86.5 fL (ref 80.0–100.0)
Monocytes Absolute: 1.1 10*3/uL — ABNORMAL HIGH (ref 0.1–1.0)
Monocytes Relative: 8 %
Neutro Abs: 9.3 10*3/uL — ABNORMAL HIGH (ref 1.7–7.7)
Neutrophils Relative %: 66 %
Platelets: 333 10*3/uL (ref 150–400)
RBC: 5.27 MIL/uL — ABNORMAL HIGH (ref 3.87–5.11)
RDW: 16.8 % — ABNORMAL HIGH (ref 11.5–15.5)
WBC: 14.1 10*3/uL — ABNORMAL HIGH (ref 4.0–10.5)
nRBC: 0 % (ref 0.0–0.2)

## 2021-06-20 LAB — CBC
Hematocrit: 43.3 % (ref 34.0–46.6)
Hemoglobin: 13.6 g/dL (ref 11.1–15.9)
MCH: 25.3 pg — ABNORMAL LOW (ref 26.6–33.0)
MCHC: 31.4 g/dL — ABNORMAL LOW (ref 31.5–35.7)
MCV: 81 fL (ref 79–97)
Platelets: 420 10*3/uL (ref 150–450)
RBC: 5.37 x10E6/uL — ABNORMAL HIGH (ref 3.77–5.28)
RDW: 15.6 % — ABNORMAL HIGH (ref 11.7–15.4)
WBC: 23.3 10*3/uL (ref 3.4–10.8)

## 2021-06-20 LAB — HEMOGLOBIN A1C
Est. average glucose Bld gHb Est-mCnc: 128 mg/dL
Hgb A1c MFr Bld: 6.1 % — ABNORMAL HIGH (ref 4.8–5.6)

## 2021-06-20 LAB — TSH: TSH: 15.1 u[IU]/mL — ABNORMAL HIGH (ref 0.450–4.500)

## 2021-06-20 LAB — D-DIMER, QUANTITATIVE: D-Dimer, Quant: 0.49 ug/mL-FEU (ref 0.00–0.50)

## 2021-06-20 MED ORDER — IOHEXOL 9 MG/ML PO SOLN
ORAL | Status: AC
  Filled 2021-06-20: qty 1000

## 2021-06-20 NOTE — Assessment & Plan Note (Signed)
D-dimer within normal limits.  Recent chest x-ray has been negative.  Crackles on exam.  Has a history of pneumonia requiring hospitalization.  Has a history of COPD.  Sending for CT chest for further evaluation. ?

## 2021-06-20 NOTE — Assessment & Plan Note (Signed)
Repeat CBC today revealed improved WBC count with continued leukocytosis with left shift.  Peripheral smear ordered given the fact that her WBC count was significantly elevated on yesterday's lab as well as in November 2022 after shoulder surgery. ?

## 2021-06-20 NOTE — Progress Notes (Signed)
? ?Subjective:  ?Patient ID: [name] [name], female    DOB: 1945-10-24  Age: 76 y.o. MRN: [id number] ? ?CC: ?Chief Complaint  ?Patient presents with  ? discuss labs  ? ? ?HPI: ? ?76 year old female with the below mentioned medical problems presents for follow-up. ? ?Patient seen yesterday.  Patient endorses some ongoing generalized weakness.  She has had shortness of breath as well.  She was sent for laboratory studies for further evaluation.  She presents today to discuss findings.  CBC revealed significant leukocytosis with a white blood cell count of 23.3.  Mildly elevated potassium of 5.7.  Mildly elevated calcium at 10.4. ? ?Patient reports that she was quite short of breath walking into the office.  She states that she has had dyspnea on exertion for the past 3 to 4 months.  Has been treated for COPD exacerbations x2.  Recent antibiotic course.  Recent chest x-ray was unremarkable.  Denies chest pain.  Denies GI symptoms.  Reports some weight gain.  She reports urinary incontinence associated with coughing.  No dysuria.  No concerns for UTI.  Previously seen by pulmonology in 2020. ? ?Patient Active Problem List  ? Diagnosis Date Noted  ? SOB (shortness of breath) on exertion 06/20/2021  ? Leukocytosis 06/20/2021  ? New onset of headaches after age 20 04/12/2021  ? 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 Recurrent urinary tract infection 11/08/2020  ? Age-related osteoporosis without current pathological fracture 07/22/2019  ? Upper airway cough syndrome 03/04/2019  ? Essential hypertension, benign 01/21/2019  ? Insomnia 12/18/2018  ? Pulmonary nodule 04/27/2016  ? Hypothyroidism   ? Restless leg syndrome   ? Depression   ? 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 ? ?Social Hx   ?Social History  ? ?Socioeconomic History  ? Marital status: Married  ?  Spouse name: Not on file  ? Number of children: Not on file  ? Years of education: Not on file  ? Highest education level: High school graduate  ?Occupational  History  ? Occupation: Retired  ?Tobacco Use  ? Smoking status: Former  ?  Packs/day: 1.50  ?  Years: 35.00  ?  Pack years: 52.50  ?  Types: Cigarettes  ?  Quit date: 04/10/2003  ?  Years since quitting: 18.2  ? Smokeless tobacco: Never  ?Vaping Use  ? Vaping Use: Never used  ?Substance and Sexual Activity  ? Alcohol use: No  ? Drug use: No  ? Sexual activity: Not on file  ?Other Topics Concern  ? Not on file  ?Social History Narrative  ? Married 22 years in 2nd marriage.First marriage lasted 20 years.Retired,now a Education officer, environmental.  ? ?Social Determinants of Health  ? ?Financial Resource Strain: Low Risk   ? Difficulty of Paying Living Expenses: Not hard at all  ?Food Insecurity: No Food Insecurity  ? Worried About Programme researcher, broadcasting/film/video in the Last Year: Never true  ? Ran Out of Food in the Last Year: Never true  ?Transportation Needs: No Transportation Needs  ? Lack of Transportation (Medical): No  ? Lack of Transportation (Non-Medical): No  ?Physical Activity: Inactive  ? Days of Exercise per Week: 0 days  ? Minutes of Exercise per Session: 0 min  ?Stress: No Stress Concern Present  ? Feeling of Stress : Not at all  ?Social Connections: Moderately Isolated  ? Frequency of Communication with Friends and Family: More than three times a week  ? Frequency of Social  Gatherings with Friends and Family: More than three times a week  ? Attends Religious Services: Never  ? Active Member of Clubs or Organizations: No  ? Attends Banker Meetings: Never  ? Marital Status: Married  ? ? ?Review of Systems ?Per HPI ? ?Objective:  ?BP 135/76   Pulse 66   Temp (!) 97.2 ?F (36.2 ?C)   Ht 4\' 11"  (1.499 m)   Wt 145 lb (65.8 kg)   SpO2 100%   BMI 29.29 kg/m?  ? ?BP/Weight 06/20/2021 06/19/2021 05/25/2021  ?Systolic BP 135 127 132  ?Diastolic BP 76 76 75  ?Wt. (Lbs) 145 144 143  ?BMI 29.29 29.08 28.88  ? ? ?Physical Exam ?Constitutional:   ?   General: She is not in acute distress. ?HENT:  ?   Head: Normocephalic and atraumatic.   ?Eyes:  ?   General:     ?   Right eye: No discharge.     ?   Left eye: No discharge.  ?   Conjunctiva/sclera: Conjunctivae normal.  ?Cardiovascular:  ?   Rate and Rhythm: Normal rate and regular rhythm.  ?   Heart sounds: No murmur heard. ?Pulmonary:  ?   Effort: Pulmonary effort is normal.  ?   Breath sounds: Rales present.  ?Neurological:  ?   Mental Status: She is alert.  ?Psychiatric:     ?   Mood and Affect: Mood normal.     ?   Behavior: Behavior normal.  ? ? ?Lab Results  ?Component Value Date  ? WBC 14.1 (H) 06/20/2021  ? HGB 13.0 06/20/2021  ? HCT 45.6 06/20/2021  ? PLT 333 06/20/2021  ? GLUCOSE 83 06/20/2021  ? CHOL 197 06/19/2021  ? TRIG 101 06/19/2021  ? HDL 56 06/19/2021  ? LDLCALC 123 (H) 06/19/2021  ? ALT 21 06/19/2021  ? AST 21 06/19/2021  ? NA 138 06/20/2021  ? K 4.7 06/20/2021  ? CL 103 06/20/2021  ? CREATININE 0.98 06/20/2021  ? BUN 28 (H) 06/20/2021  ? CO2 27 06/20/2021  ? TSH 15.100 (H) 06/19/2021  ? INR 0.90 08/14/2013  ? HGBA1C 6.1 (H) 06/19/2021  ? ? ? ?Assessment & Plan:  ? ?Problem List Items Addressed This Visit   ? ?  ? Other  ? SOB (shortness of breath) on exertion - Primary  ?  D-dimer within normal limits.  Recent chest x-ray has been negative.  Crackles on exam.  Has a history of pneumonia requiring hospitalization.  Has a history of COPD.  Sending for CT chest for further evaluation. ?  ?  ? Relevant Orders  ? CBC with Differential  ? D-Dimer, Quantitative  ? CT Chest Wo Contrast  ? Leukocytosis  ?  Repeat CBC today revealed improved WBC count with continued leukocytosis with left shift.  Peripheral smear ordered given the fact that her WBC count was significantly elevated on yesterday's lab as well as in November 2022 after shoulder surgery. ?  ?  ? Relevant Orders  ? CBC with Differential  ? Pathologist smear review  ? CT Chest Wo Contrast  ? ?Other Visit Diagnoses   ? ? Hyperkalemia      ? Relevant Orders  ? Basic metabolic panel  ? ?  ? ?December 2022 DO ?[facility] ? ?

## 2021-06-20 NOTE — Patient Instructions (Signed)
Stat labs at the hospital. ? ?I will call with results and I anticipate that I will be sending you for CT scan. ? ?Talk soon ? ?Dr. Adriana Simas ?

## 2021-06-21 ENCOUNTER — Other Ambulatory Visit: Payer: Self-pay | Admitting: Family Medicine

## 2021-06-21 DIAGNOSIS — J449 Chronic obstructive pulmonary disease, unspecified: Secondary | ICD-10-CM

## 2021-06-21 LAB — PATHOLOGIST SMEAR REVIEW

## 2021-06-27 DIAGNOSIS — H524 Presbyopia: Secondary | ICD-10-CM | POA: Diagnosis not present

## 2021-06-27 DIAGNOSIS — H52223 Regular astigmatism, bilateral: Secondary | ICD-10-CM | POA: Diagnosis not present

## 2021-06-27 DIAGNOSIS — Z961 Presence of intraocular lens: Secondary | ICD-10-CM | POA: Diagnosis not present

## 2021-06-27 DIAGNOSIS — H2513 Age-related nuclear cataract, bilateral: Secondary | ICD-10-CM | POA: Diagnosis not present

## 2021-06-27 DIAGNOSIS — H5203 Hypermetropia, bilateral: Secondary | ICD-10-CM | POA: Diagnosis not present

## 2021-07-12 DIAGNOSIS — J019 Acute sinusitis, unspecified: Secondary | ICD-10-CM | POA: Diagnosis not present

## 2021-07-25 ENCOUNTER — Encounter: Payer: Self-pay | Admitting: Internal Medicine

## 2021-07-25 ENCOUNTER — Ambulatory Visit: Payer: Medicare HMO | Admitting: Internal Medicine

## 2021-07-25 DIAGNOSIS — J449 Chronic obstructive pulmonary disease, unspecified: Secondary | ICD-10-CM | POA: Diagnosis not present

## 2021-07-25 DIAGNOSIS — R058 Other specified cough: Secondary | ICD-10-CM

## 2021-07-25 MED ORDER — FAMOTIDINE 20 MG PO TABS: ORAL_TABLET | ORAL | 11 refills | Status: DC

## 2021-07-25 MED ORDER — PANTOPRAZOLE SODIUM 40 MG PO TBEC: 40.0000 mg | DELAYED_RELEASE_TABLET | Freq: Every day | ORAL | 2 refills | Status: DC

## 2021-07-25 NOTE — Patient Instructions (Addendum)
Only use your albuterol as a rescue medication to be used if you can't catch your breath by resting or doing a relaxed purse lip breathing pattern.  ?- The less you use it, the better it will work when you need it. ?- Ok to use up to 2 puffs  every 4 hours (then the nebulizer if needed) if you must but call for immediate appointment if use goes up over your usual need ?- Don't leave home without it !!  (think of it like the spare tire for your car)  ? ?Ok to try albuterol 15 min before an activity (on alternating days)  that you know would usually make you short of breath and see if it makes any difference and if makes none then don't take albuterol after activity unless you can't catch your breath as this means it's the resting that helps, not the albuterol. ? ? ?Sinus CT will be scheduled if your insurance allows ? ?Pantoprazole (protonix) 40 mg  (prilosec)   Take  30-60 min before first meal of the day and Pepcid (famotidine)  20 mg one after  Supper for at least 6 weeks  ? ?GERD (REFLUX)  is an extremely common cause of respiratory symptoms just like yours , many times with no obvious heartburn at all.  ? ? It can be treated with medication, but also with lifestyle changes including elevation of the head of your bed (ideally with 6 -8inch blocks under the headboard of your bed),  Smoking cessation, avoidance of late meals, excessive alcohol, and avoid fatty foods, chocolate, peppermint, colas, red wine, and acidic juices such as orange juice.  ?NO MINT OR MENTHOL PRODUCTS SO NO COUGH DROPS  ?USE SUGARLESS CANDY INSTEAD (Jolley ranchers or Stover's or Life Savers) or even ice chips will also do - the key is to swallow to prevent all throat clearing. ?NO OIL BASED VITAMINS - use powdered substitutes.  Avoid fish oil when coughing. ? ? ?If not all better in 6 weeks please call. ? ? ? ?    ?

## 2021-07-25 NOTE — Progress Notes (Signed)
? ? ? ?Brief patient profile:  ?46 yowf  Quit smoking 2005 grew up in house with smoking and remembers even in childhood with frequent "bad bronchitis breathing and cough" hosp [facility] > much better at discharge but slowed down some and used saba and continued to have episodic flares with either bronchitis or pna several times a year eval by Orson Aloe but not maint rx referred to pulmonary clinic 03/03/2019 by Dr  Karilyn Cota p bad pna in 2018 at [facility] and never right since with daily cough/doe. ? ? ? ? ?History of Present Illness  ?03/03/2019  Pulmonary/ 1st office eval/[name] [name]  ?Chief Complaint  ?Patient presents with  ? Pulmonary Consult  ?  Referred by Dr Karilyn Cota for eval of COPD. Pt c/o SOB and cough for the past year.   ?Dyspnea: MMRC3 = can't walk 100 yards even at a slow pace at a flat grade s stopping due to sob   ?Cough: seems to choke her to point of gag but mostly dry day >> noct ?Sleep: sleeping on back /one pillow less  ?SABA use: none x one month  - has neb but doesn't remember if it helped  ?Rec ?Pantoprazole (protonix) 40 mg   Take  30-60 min before first meal of the day and Pepcid (famotidine)  20 mg one after  Supper until return to office -  ?GERD diet reviewed, bed blocks rec  ? ? ?Allergy screen  03/03/2019 >  Eos 0.3 /  IgE 44 ? ?After this eval did really well and only bothered by occ "colds in chest" but not needing any chronic rx at all  ? ? ?07/25/2021  re-est / consultation/ [name] [name] office/[name] [name] referred by Dr Tommy Rainwater re:  sob p bad bronchitis in Feb 2023 with abx/ prednisone / saba hfa and neb  with total of at least 3 courses abx no better  ?Chief Complaint  ?Patient presents with  ? Follow-up  ?  SOB since getting sick in January 2023   ?Dyspnea:  mopping and sweeping  ?Mb 150 ft downhill / back uphill has to stop  twice  ?Cough: still hoarse, dry  ?Sleeping: flat in bed / one pillow fine off cpap s noct symptoms ?SABA use: never rechallenging or rechallenging ?02: none   ?Covid status: vax x 3  ?Not on propranolol x 3 weeks - no change in symptoms  ? ?No obvious day to day or daytime variability or assoc excess/ purulent sputum or mucus plugs or hemoptysis or cp or chest tightness, subjective wheeze or overt sinus or hb symptoms.  ? ?  Also denies any obvious fluctuation of symptoms with weather or environmental changes or other aggravating or alleviating factors except as outlined above  ? ?No unusual exposure hx or  knowledge of premature birth. ? ?Current Allergies, Complete Past Medical History, Past Surgical History, Family History, and Social History were reviewed in [employer] record. ? ?ROS  The following are not active complaints unless bolded ?Hoarseness, sore throat, dysphagia, dental problems, itching, sneezing,  nasal congestion or discharge of excess mucus or purulent secretions, ear ache,   fever, chills, sweats, unintended wt loss or wt gain, classically pleuritic or exertional cp,  orthopnea pnd or arm/hand swelling  or leg swelling, presyncope, palpitations, abdominal pain, anorexia, nausea, vomiting, diarrhea  or change in bowel habits or change in bladder habits, change in stools or change in urine, dysuria, hematuria,  rash, arthralgias, visual complaints, headache, numbness, weakness or ataxia or  problems with walking or coordination,  change in mood or  memory. ?      ? ?Current Meds  ?Medication Sig  ? albuterol (VENTOLIN HFA) 108 (90 Base) MCG/ACT inhaler Inhale into the lungs.  ? amitriptyline (ELAVIL) 50 MG tablet Take 1 tablet (50 mg total) by mouth daily. (Patient taking differently: Take 50 mg by mouth at bedtime.)  ? amLODipine (NORVASC) 5 MG tablet Take 1 tablet (5 mg total) by mouth daily.  ? Carboxymethylcellul-Glycerin (LUBRICATING EYE DROPS OP) Place 1 drop into both eyes daily as needed (dry eyes).  ? Cholecalciferol 125 MCG (5000 UT) TABS Take 5,000 Units by mouth at bedtime.  ? gabapentin (NEURONTIN) 300 MG capsule Take  2 capsules (600 mg total) by mouth at bedtime.  ? levothyroxine (SYNTHROID) 88 MCG tablet Take 1 tablet (88 mcg total) by mouth daily.  ?     ?   ? ? ?Past Medical History:  ?Diagnosis Date  ? COPD (chronic obstructive pulmonary disease) (HCC)   ? Depression   ? Essential hypertension, benign 01/21/2019  ? History of pneumonia   ? 2010 on ventalator  ? History of recurrent UTIs   ? Hypothyroidism   ? Insomnia 12/18/2018  ? Left knee DJD   ? PTSD (post-traumatic stress disorder)   ? Restless leg syndrome   ? ?  ? ?Objective:  ?  ? ?Wt Readings from Last 3 Encounters:  ?06/20/21 145 lb (65.8 kg)  ?06/19/21 144 lb (65.3 kg)  ?05/25/21 143 lb (64.9 kg)  ?  ? ? ?Vital signs reviewed  07/25/2021  - Note at rest 02 sats  98% on RA  ? ?General appearance:    amb pleasant wf nad with very harsh sounding upper airway pattern cough on early insp  ? ? HEENT : no cobblestoning, pnd/ no Turbinates   ? ? ?NECK :  without JVD/Nodes/TM/ nl carotid upstrokes bilaterally ? ? ?LUNGS: no acc muscle use,  Nl contour chest which is clear to A and P bilaterally without cough on  exp maneuvers ? ? ?CV:  RRR  no s3 or murmur or increase in P2, and no edema  ? ?ABD:  soft and nontender with nl inspiratory excursion in the supine position. No bruits or organomegaly appreciated, bowel sounds nl ? ?MS:  Nl gait/ ext warm without deformities, calf tenderness, cyanosis or clubbing ?No obvious joint restrictions  ? ?SKIN: warm and dry without lesions   ? ?NEURO:  alert, approp, nl sensorium with  no motor or cerebellar deficits apparent.    ? ?   ?   ? ?I personally reviewed images and agree with radiology impression as follows:  ? Chest CT w/o contrast 06/20/21 ?No acute cardiopulmonary abnormalities. No signs of interstitial ?lung disease. ? ?  ?Assessment  ? ?  ? ? ?  ? ?  ? ?  ? ?  ? ? ?  ?  ?

## 2021-07-25 NOTE — Assessment & Plan Note (Addendum)
Quit smoking 2005  ?- onset of symptoms 2018 p admit to [facility] with CAP ?- 03/03/2019   Walked RA x two laps =  approx 548ft @ mod to fast pace - stopped due to end of study, min sob with sats of 97 at the end of the study. ?- Alpha one AT screen  03/03/2019  Level 170  MM ?- 07/25/2021   Walked on RA  x  3  lap(s) =  approx 450  ft  @ fast pace, stopped due to end of study with lowest 02 sats 93% and mild sob during second lap  ? ?I really don't think she has much copd at all and doubt the saba is really helping  ? ?- The proper method of use, as well as anticipated side effects, of a metered-dose inhaler were discussed and demonstrated to the patient using teach back method  ? ?Advised: ?Re SABA :  I spent extra time with pt today reviewing appropriate use of albuterol for prn use on exertion with the following points: ?1) saba is for relief of sob that does not improve by walking a slower pace or resting but rather if the pt does not improve after trying this first. ?2) If the pt is convinced, as many are, that saba helps recover from activity faster then it's easy to tell if this is the case by re-challenging : ie stop, take the inhaler, then p 5 minutes try the exact same activity (intensity of workload) that just caused the symptoms and see if they are substantially diminished or not after saba ?3) if there is an activity that reproducibly causes the symptoms, try the saba 15 min before the activity on alternate days  ? ?If in fact the saba really does help, then fine to continue to use it prn but advised may need to look closer at the maintenance regimen being used to achieve better control of airways disease with exertion.  ? ?If not needing /benefiting from saba and back to baseline ex tol no need for f/u - if not, full pfts next step here. ? ?Each maintenance medication was reviewed in detail including emphasizing most importantly the difference between maintenance and prns and under what circumstances the  prns are to be triggered using an action plan format where appropriate. ? ?Total time for H and P, chart review, counseling, reviewing hfa/neb device(s) , directly observing portions of ambulatory 02 saturation study/ and generating customized AVS unique to this office visit / same day charting  > 60 min consultation   ?      ?

## 2021-07-25 NOTE — Assessment & Plan Note (Signed)
Onset 2018 p CAP ? - Allergy screen  03/03/2019 >  Eos 0.3 /  IgE 44 ?- 03/03/2019 max rx for gerd >> resolved until Feb 2023 ?- 07/25/2021  Resume gerd rx for at least 6 weeks  ?- 07/25/2021 sinus Ct >>> ? ?Of the three most common causes of  Sub-acute / recurrent or chronic cough, only one (GERD)  can actually contribute to/ trigger  the other two (asthma and post nasal drip syndrome)  and perpetuate the cylce of cough. ? ?While not intuitively obvious, many patients with chronic low grade reflux do not cough until there is a primary insult that disturbs the protective epithelial barrier and exposes sensitive nerve endings.   This is typically viral but can due to PNDS and  either may apply here.   The point is that once this occurs, it is difficult to eliminate the cycle  using anything but a maximally effective acid suppression regimen at least in the short run, accompanied by an appropriate diet to address non acid GERD and control / eliminate the cough itself and urge to clear throat with hard rock candy and f/u with sinus CT to r/u occult sinusitis refractory to abx which can cause a similar picture (favor gerd since cough is dry sounding and not worse in am's)  ?

## 2021-08-02 ENCOUNTER — Other Ambulatory Visit: Payer: Self-pay | Admitting: Family Medicine

## 2021-08-02 DIAGNOSIS — F5105 Insomnia due to other mental disorder: Secondary | ICD-10-CM

## 2021-08-15 ENCOUNTER — Encounter: Payer: Self-pay | Admitting: Gastroenterology

## 2021-08-15 NOTE — Progress Notes (Signed)
? ?Referring Provider: Dr. Karilyn CotaGosrani ?Primary Care Physician:  Tommie Samsook, Jayce G, DO ?Primary Gastroenterologist:  Dr. Marletta Lorarver ? ?Chief Complaint  ?Patient presents with  ? Colonoscopy  ? ? ?HPI:   ?[name] [name] is a 76 y.o. female presenting today at the request of Dr. Karilyn CotaGosrani for consult colonoscopy. ? ?Today:  ?Last colonoscopy in 2006 at [facility]. No polyps that she is aware of. No Fhx of colon cancer or polyps that she is aware of.  ? ?Some constipation at times. Bowels move every 2 days at baseline.  Occasionally, bowel movements start with passing small, hard balls, but then the stool thereafter is soft. Takes a mild laxative at times. Noting on a regular basis. No brbpr or melena.  No abdominal pain. ? ?Denies ever having heartburn symptoms. Pulmonologist started reflux medications due to chest congestion as her lungs sounding clear.  This was around the beginning of the year.  She is feeling better, but she is not sure if it was related to reflux or her just getting over her acute illness.  Denies nausea, vomiting, dysphagia. ? ?Some SOB with exertion.  Intermittent cough. ? ?Has arthritis in her right knee. Needs replacement. Has some swelling in in her right lower leg when she walks a lot.  Left knee has already been replaced. ? ? ?Past Medical History:  ?Diagnosis Date  ? COPD (chronic obstructive pulmonary disease) (HCC)   ? Depression   ? Dyspnea   ? Essential hypertension, benign 01/21/2019  ? History of pneumonia   ? 2010 on ventalator  ? History of recurrent UTIs   ? Hyperlipidemia   ? Hypothyroidism   ? Insomnia 12/18/2018  ? Left knee DJD   ? Osteoporosis   ? Pneumonia   ? PTSD (post-traumatic stress disorder)   ? Restless leg syndrome   ? Sleep apnea   ? ? ?Past Surgical History:  ?Procedure Laterality Date  ? ABDOMINAL HYSTERECTOMY    ? COLONOSCOPY    ? [facility]  ? KNEE ARTHROSCOPY WITH MEDIAL MENISECTOMY Left 08/24/2013  ? Procedure: LEFT KNEE ARTHROSCOPY WITH MEDIAL MENISCECTOMY  ;  Surgeon:  Nilda Simmerobert A Wainer, MD;  Location: [facility] OR;  Service: Orthopedics;  Laterality: Left;  ? PARTIAL KNEE ARTHROPLASTY Left 08/24/2013  ? dr Denton Arwanier  ? PARTIAL KNEE ARTHROPLASTY Left 08/24/2013  ? Procedure: LEFT KNEE ARTHROPLASTY UNI COMPARTMENTAL ;  Surgeon: Nilda Simmerobert A Wainer, MD;  Location: [facility] OR;  Service: Orthopedics;  Laterality: Left;  Left knee arthroplasty uni compartment  With left knee scope medial meniscectomy  ?  ? REVERSE SHOULDER ARTHROPLASTY Left 02/14/2021  ? Procedure: LEFT REVERSE SHOULDER ARTHROPLASTY;  Surgeon: Cammy Copaean, Gregory Scott, MD;  [facility];  Service: Orthopedics;  Laterality: Left;  ? TOTAL VAGINAL HYSTERECTOMY    ? ? ?Current Outpatient Medications  ?Medication Sig Dispense Refill  ? albuterol (VENTOLIN HFA) 108 (90 Base) MCG/ACT inhaler Inhale into the lungs.    ? amitriptyline (ELAVIL) 50 MG tablet Take 1 tablet (50 mg total) by mouth daily. (Patient taking differently: Take 50 mg by mouth at bedtime.) 90 tablet 3  ? amLODipine (NORVASC) 5 MG tablet Take 1 tablet (5 mg total) by mouth daily. 90 tablet 3  ? Carboxymethylcellul-Glycerin (LUBRICATING EYE DROPS OP) Place 1 drop into both eyes daily as needed (dry eyes).    ? famotidine (PEPCID) 20 MG tablet One after supper (Patient taking differently: at bedtime.) 30 tablet 11  ? gabapentin (NEURONTIN) 300 MG capsule Take 2 capsules (600 mg total)  by mouth at bedtime. 180 capsule 3  ? levothyroxine (SYNTHROID) 88 MCG tablet Take 1 tablet (88 mcg total) by mouth daily. 90 tablet 1  ? pantoprazole (PROTONIX) 40 MG tablet Take 1 tablet (40 mg total) by mouth daily. Take 30-60 min before first meal of the day 30 tablet 2  ? albuterol (PROVENTIL) (2.5 MG/3ML) 0.083% nebulizer solution INHALE THE CONTENTS OF 1 VIAL VIA NEBULIZER EVERY 6 HOURS AS NEEDED FOR WHEEZING OR SHORTNESS OF BREATH (Patient not taking: Reported on 06/19/2021) 180 mL 2  ? ?No current facility-administered medications for this visit.  ? ? ?Allergies as of 08/17/2021 - Review  Complete 08/17/2021  ?Allergen Reaction Noted  ? Codeine Itching 03/22/2016  ? ? ?Family History  ?Problem Relation Age of Onset  ? Stroke Mother   ? Heart disease Father   ? Heart attack Father   ? Hypertension Father   ? Heart attack Brother   ? Emphysema Daughter   ? Emphysema Son   ? Hypertension Son   ? Colon cancer Neg Hx   ? Colon polyps Neg Hx   ? ? ?Social History  ? ?Socioeconomic History  ? Marital status: Married  ?  Spouse name: Not on file  ? Number of children: Not on file  ? Years of education: Not on file  ? Highest education level: High school graduate  ?Occupational History  ? Occupation: Retired  ?Tobacco Use  ? Smoking status: Former  ?  Packs/day: 1.50  ?  Years: 35.00  ?  Pack years: 52.50  ?  Types: Cigarettes  ?  Quit date: 04/10/2003  ?  Years since quitting: 18.3  ? Smokeless tobacco: Never  ?Vaping Use  ? Vaping Use: Never used  ?Substance and Sexual Activity  ? Alcohol use: No  ? Drug use: No  ? Sexual activity: Not on file  ?Other Topics Concern  ? Not on file  ?Social History Narrative  ? Married 22 years in 2nd marriage.First marriage lasted 20 years.Retired,now a Education officer, environmental.  ? ?Social Determinants of Health  ? ?Financial Resource Strain: Low Risk   ? Difficulty of Paying Living Expenses: Not hard at all  ?Food Insecurity: No Food Insecurity  ? Worried About Programme researcher, broadcasting/film/video in the Last Year: Never true  ? Ran Out of Food in the Last Year: Never true  ?Transportation Needs: No Transportation Needs  ? Lack of Transportation (Medical): No  ? Lack of Transportation (Non-Medical): No  ?Physical Activity: Inactive  ? Days of Exercise per Week: 0 days  ? Minutes of Exercise per Session: 0 min  ?Stress: No Stress Concern Present  ? Feeling of Stress : Not at all  ?Social Connections: Moderately Isolated  ? Frequency of Communication with Friends and Family: More than three times a week  ? Frequency of Social Gatherings with Friends and Family: More than three times a week  ? Attends Religious  Services: Never  ? Active Member of Clubs or Organizations: No  ? Attends Banker Meetings: Never  ? Marital Status: Married  ?Intimate Partner Violence: Not At Risk  ? Fear of Current or Ex-Partner: No  ? Emotionally Abused: No  ? Physically Abused: No  ? Sexually Abused: No  ? ? ?Review of Systems: ?Gen: Denies any fever, chills, cold or flu like symptoms, pre-syncope, or syncope.  ?CV: Denies chest pain or heart palpitations.  ?Resp: See HPI ?GI: See HPI ?GU : Denies urinary burning, urinary frequency, urinary hesitancy ?MS:  Admits to joint pain in the setting of arthritis. ?Derm: Denies rash. ?Psych: Denies depression, anxiety. ?Heme: See HPI ? ?Physical Exam: ?BP 135/68   Pulse 84   Temp 97.6 ?F (36.4 ?C) (Temporal)   Ht 4\' 11"  (1.499 m)   Wt 144 lb 12.8 oz (65.7 kg)   BMI 29.25 kg/m?  ?General:   Alert and oriented. Pleasant and cooperative. Well-nourished and well-developed.  ?Head:  Normocephalic and atraumatic. ?Eyes:  Without icterus, sclera clear and conjunctiva pink.  ?Ears:  Normal auditory acuity. ?Lungs:  Clear to auscultation bilaterally. No wheezes, rales, or rhonchi. No distress.  ?Heart:  S1, S2 present without murmurs appreciated.  ?Abdomen:  +BS, soft, non-tender and non-distended. No HSM noted. No guarding or rebound. No masses appreciated.  ?Rectal:  Deferred  ?Msk:  Symmetrical without gross deformities. Normal posture. ?Extremities: 1+ pitting edema in right lower extremity. ?Neurologic:  Alert and  oriented x4;  grossly normal neurologically. ?Skin:  Intact without significant lesions or rashes. ?Psych:  Normal mood and affect. ? ? ? ?Assessment:  ?76 year old female with history of COPD, HTN, HLD, hypothyroidism, arthritis, depression, PTSD, presenting today to discuss scheduling colonoscopy.  She reports her last colonoscopy was in 2006 at [facility] without polyps.  No family history of colon cancer or colon polyps that she is aware of.  She does have chronic, mild  constipation for which she will take a mild laxative at times, but no other significant GI symptoms.  No alarm symptoms.  ? ? ?Plan:  ?Proceed with colonoscopy with propofol by Dr. [place] in near future. The risks, benefi

## 2021-08-17 ENCOUNTER — Encounter: Payer: Self-pay | Admitting: Gastroenterology

## 2021-08-17 ENCOUNTER — Encounter: Payer: Self-pay | Admitting: *Deleted

## 2021-08-17 ENCOUNTER — Telehealth: Payer: Self-pay | Admitting: Gastroenterology

## 2021-08-17 ENCOUNTER — Ambulatory Visit: Payer: Medicare HMO | Admitting: Gastroenterology

## 2021-08-17 ENCOUNTER — Telehealth: Payer: Self-pay | Admitting: *Deleted

## 2021-08-17 VITALS — BP 135/68 | HR 84 | Temp 97.6°F | Ht 59.0 in | Wt 144.8 lb

## 2021-08-17 DIAGNOSIS — Z1211 Encounter for screening for malignant neoplasm of colon: Secondary | ICD-10-CM | POA: Diagnosis not present

## 2021-08-17 DIAGNOSIS — K5900 Constipation, unspecified: Secondary | ICD-10-CM

## 2021-08-17 MED ORDER — PEG 3350-KCL-NA BICARB-NACL 420 G PO SOLR: ORAL | 0 refills | Status: DC

## 2021-08-17 NOTE — Telephone Encounter (Signed)
Received and reviewed colonoscopy records. Colonoscopy completed 12/13/2004 with Dr. Anthony Sar with entirely normal exam.  ? ?Will have report scanned into chart. I have updated history. We will proceed with screening colonoscopy as planned.  ?

## 2021-08-17 NOTE — Patient Instructions (Signed)
We will arrange to have a colonoscopy in the near future with Dr. Marletta Lor. ? ?For your constipation: ?Start Colace (docusate sodium) 100-200 mg daily. ?If Colace does not work well, you can try MiraLAX 1 capful (17 g) daily in 8 ounces of water. ?Try to increase your daily fiber intake through fruits and vegetables. ?Drink at least 64 ounces of water daily. ?If you continue to struggle with constipation, please let me know.  As we discussed, it is important that your bowels moving well prior to colonoscopy to ensure that you have a good prep. ? ?We will see you back in the office as needed.  Do not hesitate to call if you have any questions or concerns. ? ?It was nice meeting you today!  ? ?Ermalinda Memos, PA-C ?[facility] ? ?

## 2021-08-17 NOTE — Addendum Note (Signed)
Addended by: Armstead Peaks on: 08/17/2021 10:46 AM ? ? Modules accepted: Orders ? ?

## 2021-08-17 NOTE — Telephone Encounter (Signed)
PA approved. Auth # Francine Graven - [id number], DOS 09/11/2021 - 12/10/2021 ?

## 2021-08-21 ENCOUNTER — Ambulatory Visit (HOSPITAL_COMMUNITY)
Admission: RE | Admit: 2021-08-21 | Discharge: 2021-08-21 | Disposition: A | Discharge status: Home / Self Care | Payer: Medicare HMO | Source: Ambulatory Visit | Attending: Internal Medicine | Admitting: Internal Medicine

## 2021-08-21 DIAGNOSIS — R058 Other specified cough: Secondary | ICD-10-CM | POA: Insufficient documentation

## 2021-08-21 DIAGNOSIS — L0201 Cutaneous abscess of face: Secondary | ICD-10-CM | POA: Diagnosis not present

## 2021-08-30 ENCOUNTER — Telehealth: Payer: Self-pay | Admitting: Family Medicine

## 2021-08-30 NOTE — Telephone Encounter (Signed)
Left message for patient to call back and schedule Medicare Annual Wellness Visit (AWV) in office.   If unable to come into the office for AWV,  please offer to do virtually or by telephone.  Last AWV: 08/29/2020  Please schedule at anytime with [facility] Health Advisor.  30 minute appointment for Virtual or phone  45 minute appointment for in office or Initial virtual/phone  Any questions, please contact me at [phone]

## 2021-09-01 ENCOUNTER — Other Ambulatory Visit: Payer: Self-pay | Admitting: Family Medicine

## 2021-09-01 DIAGNOSIS — R519 Headache, unspecified: Secondary | ICD-10-CM

## 2021-09-04 NOTE — Patient Instructions (Signed)
Ms. Rodak , Thank you for taking time to come for your Medicare Wellness Visit. I appreciate your ongoing commitment to your health goals. Please review the following plan we discussed and let me know if I can assist you in the future.   Screening recommendations/referrals: Colonoscopy: Done 12/17/2004 Repeat in 10 years. Scheduled for 09/11/2021.  Mammogram: Done 06/25/2019 Repeat annually. Order placed today.   Bone Density: Done 08/04/2019 Repeat every 2 years  Recommended yearly ophthalmology/optometry visit for glaucoma screening and checkup Recommended yearly dental visit for hygiene and checkup  Vaccinations: Influenza vaccine: Done 01/23/2021 Repeat annually  Pneumococcal vaccine: Done 08/29/2020 and 01/21/2019. Tdap vaccine: Done 07/22/2019. Repeat in 10 years Shingles vaccine: Done 04/09/2013.   Covid-19:Done 06/22/2019 and 05/25/2019.  Advanced directives: Please bring a copy of your health care power of attorney and living will to the office to be added to your chart at your convenience.   Conditions/risks identified: Aim for 30 minutes of exercise or brisk walking, 6-8 glasses of water, and 5 servings of fruits and vegetables each day.   Next appointment: Follow up in one year for your annual wellness visit 2024.   Preventive Care 32 Years and Older, Female Preventive care refers to lifestyle choices and visits with your health care provider that can promote health and wellness. What does preventive care include? A yearly physical exam. This is also called an annual well check. Dental exams once or twice a year. Routine eye exams. Ask your health care provider how often you should have your eyes checked. Personal lifestyle choices, including: Daily care of your teeth and gums. Regular physical activity. Eating a healthy diet. Avoiding tobacco and drug use. Limiting alcohol use. Practicing safe sex. Taking low-dose aspirin every day. Taking vitamin and mineral supplements  as recommended by your health care provider. What happens during an annual well check? The services and screenings done by your health care provider during your annual well check will depend on your age, overall health, lifestyle risk factors, and family history of disease. Counseling  Your health care provider may ask you questions about your: Alcohol use. Tobacco use. Drug use. Emotional well-being. Home and relationship well-being. Sexual activity. Eating habits. History of falls. Memory and ability to understand (cognition). Work and work Astronomer. Reproductive health. Screening  You may have the following tests or measurements: Height, weight, and BMI. Blood pressure. Lipid and cholesterol levels. These may be checked every 5 years, or more frequently if you are over 57 years old. Skin check. Lung cancer screening. You may have this screening every year starting at age 74 if you have a 30-pack-year history of smoking and currently smoke or have quit within the past 15 years. Fecal occult blood test (FOBT) of the stool. You may have this test every year starting at age 74 Flexible sigmoidoscopy or colonoscopy. You may have a sigmoidoscopy every 5 years or a colonoscopy every 10 years starting at age 72. Hepatitis C blood test. Hepatitis B blood test. Sexually transmitted disease (STD) testing. Diabetes screening. This is done by checking your blood sugar (glucose) after you have not eaten for a while (fasting). You may have this done every 1-3 years. Bone density scan. This is done to screen for osteoporosis. You may have this done starting at age 74 Mammogram. This may be done every 1-2 years. Talk to your health care provider about how often you should have regular mammograms. Talk with your health care provider about your test results, treatment options, and  if necessary, the need for more tests. Vaccines  Your health care provider may recommend certain vaccines, such  as: Influenza vaccine. This is recommended every year. Tetanus, diphtheria, and acellular pertussis (Tdap, Td) vaccine. You may need a Td booster every 10 years. Zoster vaccine. You may need this after age 8. Pneumococcal 13-valent conjugate (PCV13) vaccine. One dose is recommended after age 25. Pneumococcal polysaccharide (PPSV23) vaccine. One dose is recommended after age 4. Talk to your health care provider about which screenings and vaccines you need and how often you need them. This information is not intended to replace advice given to you by your health care provider. Make sure you discuss any questions you have with your health care provider. Document Released: 04/22/2015 Document Revised: 12/14/2015 Document Reviewed: 01/25/2015 Elsevier Interactive Patient Education  2017 [employer] Prevention in the Home Falls can cause injuries. They can happen to people of all ages. There are many things you can do to make your home safe and to help prevent falls. What can I do on the outside of my home? Regularly fix the edges of walkways and driveways and fix any cracks. Remove anything that might make you trip as you walk through a door, such as a raised step or threshold. Trim any bushes or trees on the path to your home. Use bright outdoor lighting. Clear any walking paths of anything that might make someone trip, such as rocks or tools. Regularly check to see if handrails are loose or broken. Make sure that both sides of any steps have handrails. Any raised decks and porches should have guardrails on the edges. Have any leaves, snow, or ice cleared regularly. Use sand or salt on walking paths during winter. Clean up any spills in your garage right away. This includes oil or grease spills. What can I do in the bathroom? Use night lights. Install grab bars by the toilet and in the tub and shower. Do not use towel bars as grab bars. Use non-skid mats or decals in the tub or  shower. If you need to sit down in the shower, use a plastic, non-slip stool. Keep the floor dry. Clean up any water that spills on the floor as soon as it happens. Remove soap buildup in the tub or shower regularly. Attach bath mats securely with double-sided non-slip rug tape. Do not have throw rugs and other things on the floor that can make you trip. What can I do in the bedroom? Use night lights. Make sure that you have a light by your bed that is easy to reach. Do not use any sheets or blankets that are too big for your bed. They should not hang down onto the floor. Have a firm chair that has side arms. You can use this for support while you get dressed. Do not have throw rugs and other things on the floor that can make you trip. What can I do in the kitchen? Clean up any spills right away. Avoid walking on wet floors. Keep items that you use a lot in easy-to-reach places. If you need to reach something above you, use a strong step stool that has a grab bar. Keep electrical cords out of the way. Do not use floor polish or wax that makes floors slippery. If you must use wax, use non-skid floor wax. Do not have throw rugs and other things on the floor that can make you trip. What can I do with my stairs? Do not leave any  items on the stairs. Make sure that there are handrails on both sides of the stairs and use them. Fix handrails that are broken or loose. Make sure that handrails are as long as the stairways. Check any carpeting to make sure that it is firmly attached to the stairs. Fix any carpet that is loose or worn. Avoid having throw rugs at the top or bottom of the stairs. If you do have throw rugs, attach them to the floor with carpet tape. Make sure that you have a light switch at the top of the stairs and the bottom of the stairs. If you do not have them, ask someone to add them for you. What else can I do to help prevent falls? Wear shoes that: Do not have high heels. Have  rubber bottoms. Are comfortable and fit you well. Are closed at the toe. Do not wear sandals. If you use a stepladder: Make sure that it is fully opened. Do not climb a closed stepladder. Make sure that both sides of the stepladder are locked into place. Ask someone to hold it for you, if possible. Clearly mark and make sure that you can see: Any grab bars or handrails. First and last steps. Where the edge of each step is. Use tools that help you move around (mobility aids) if they are needed. These include: Canes. Walkers. Scooters. Crutches. Turn on the lights when you go into a dark area. Replace any light bulbs as soon as they burn out. Set up your furniture so you have a clear path. Avoid moving your furniture around. If any of your floors are uneven, fix them. If there are any pets around you, be aware of where they are. Review your medicines with your doctor. Some medicines can make you feel dizzy. This can increase your chance of falling. Ask your doctor what other things that you can do to help prevent falls. This information is not intended to replace advice given to you by your health care provider. Make sure you discuss any questions you have with your health care provider. Document Released: 01/20/2009 Document Revised: 09/01/2015 Document Reviewed: 04/30/2014 Elsevier Interactive Patient Education  2017 [employer].

## 2021-09-05 ENCOUNTER — Ambulatory Visit (INDEPENDENT_AMBULATORY_CARE_PROVIDER_SITE_OTHER): Payer: Medicare HMO

## 2021-09-05 VITALS — Ht 59.0 in | Wt 144.0 lb

## 2021-09-05 DIAGNOSIS — Z1231 Encounter for screening mammogram for malignant neoplasm of breast: Secondary | ICD-10-CM | POA: Diagnosis not present

## 2021-09-05 DIAGNOSIS — Z0000 Encounter for general adult medical examination without abnormal findings: Secondary | ICD-10-CM

## 2021-09-05 NOTE — Progress Notes (Signed)
Subjective:   [name] [name] is a 76 y.o. female who presents for Medicare Annual (Subsequent) preventive examination. Virtual Visit via Telephone Note  I connected with  [name] [name] on 09/05/21 at  1:30 PM EDT by telephone and verified that I am speaking with the correct person using two identifiers.  Location: Patient: HOME Provider: [facility] Persons participating in the virtual visit: patient/Nurse Health Advisor   I discussed the limitations, risks, security and privacy concerns of performing an evaluation and management service by telephone and the availability of in person appointments. The patient expressed understanding and agreed to proceed.  Interactive audio and video telecommunications were attempted between this nurse and patient, however failed, due to patient having technical difficulties OR patient did not have access to video capability.  We continued and completed visit with audio only.  Some vital signs may be absent or patient reported.   [name] Dash, LPN  Review of Systems     Cardiac Risk Factors include: advanced age (>75men, >4 women);hypertension;sedentary lifestyle;Other (see comment), Risk factor comments: COPD, Osteoporosis.     Objective:    Today's Vitals   09/05/21 1338  Weight: 144 lb (65.3 kg)  Height: 4\' 11"  (1.499 m)   Body mass index is 29.08 kg/m.     09/05/2021    1:46 PM 02/10/2021   11:04 AM 08/29/2020   10:52 AM 07/20/2016    8:47 PM 04/12/2016   12:32 PM 08/25/2013   11:22 AM 08/14/2013   11:30 AM  Advanced Directives  Does Patient Have a Medical Advance Directive? Yes No No No No Patient does not have advance directive;Patient would not like information Patient does not have advance directive;Patient would like information  Type of Advance Directive Healthcare Power of [place];Living will        Copy of Healthcare Power of Attorney in Chart? Yes - validated most recent copy scanned in chart (See row information)        Would  patient like information on creating a medical advance directive?  No - Patient declined No - Patient declined No - Patient declined No - Patient declined  Advance directive packet given  Pre-existing out of facility DNR order (yellow form or pink MOST form)      No     Current Medications (verified) Outpatient Encounter Medications as of 09/05/2021  Medication Sig   ALPRAZolam (XANAX) 0.5 MG tablet Take 0.5 mg by mouth at bedtime.   amitriptyline (ELAVIL) 50 MG tablet Take 1 tablet (50 mg total) by mouth daily. (Patient taking differently: Take 50 mg by mouth at bedtime.)   amLODipine (NORVASC) 2.5 MG tablet Take 2.5 mg by mouth daily.   amLODipine (NORVASC) 5 MG tablet Take 1 tablet (5 mg total) by mouth daily.   Carboxymethylcellul-Glycerin (LUBRICATING EYE DROPS OP) Place 1 drop into both eyes daily as needed (dry eyes).   diclofenac Sodium (VOLTAREN) 1 % GEL Apply 2 g topically daily as needed (knee pain).   famotidine (PEPCID) 20 MG tablet One after supper   gabapentin (NEURONTIN) 300 MG capsule Take 2 capsules (600 mg total) by mouth at bedtime.   levothyroxine (SYNTHROID) 88 MCG tablet Take 1 tablet (88 mcg total) by mouth daily.   pantoprazole (PROTONIX) 40 MG tablet Take 1 tablet (40 mg total) by mouth daily. Take 30-60 min before first meal of the day   albuterol (PROVENTIL) (2.5 MG/3ML) 0.083% nebulizer solution INHALE THE CONTENTS OF 1 VIAL VIA NEBULIZER EVERY 6 HOURS  AS NEEDED FOR WHEEZING OR SHORTNESS OF BREATH (Patient not taking: Reported on 06/19/2021)   polyethylene glycol-electrolytes (NULYTELY) 420 g solution As directed (Patient not taking: Reported on 09/05/2021)   No facility-administered encounter medications on file as of 09/05/2021.    Allergies (verified) Codeine   History: Past Medical History:  Diagnosis Date   COPD (chronic obstructive pulmonary disease) (HCC)    Depression    Dyspnea    Essential hypertension, benign 01/21/2019   History of pneumonia     2010 on ventalator   History of recurrent UTIs    Hyperlipidemia    Hypothyroidism    Insomnia 12/18/2018   Left knee DJD    Osteoporosis    Pneumonia    PTSD (post-traumatic stress disorder)    Restless leg syndrome    Sleep apnea    Past Surgical History:  Procedure Laterality Date   ABDOMINAL HYSTERECTOMY     COLONOSCOPY  12/13/2004   Dr. Gabriel CirrieMason; Normal exam   KNEE ARTHROSCOPY WITH MEDIAL MENISECTOMY Left 08/24/2013   Procedure: LEFT KNEE ARTHROSCOPY WITH MEDIAL MENISCECTOMY  ;  Surgeon: Nilda Simmerobert A Wainer, MD;  Location: [facility] OR;  Service: Orthopedics;  Laterality: Left;   PARTIAL KNEE ARTHROPLASTY Left 08/24/2013   dr Denton Arwanier   PARTIAL KNEE ARTHROPLASTY Left 08/24/2013   Procedure: LEFT KNEE ARTHROPLASTY UNI COMPARTMENTAL ;  Surgeon: Nilda Simmerobert A Wainer, MD;  Location: [facility] OR;  Service: Orthopedics;  Laterality: Left;  Left knee arthroplasty uni compartment  With left knee scope medial meniscectomy     REVERSE SHOULDER ARTHROPLASTY Left 02/14/2021   Procedure: LEFT REVERSE SHOULDER ARTHROPLASTY;  Surgeon: Cammy Copaean, Gregory Scott, MD;  [facility];  Service: Orthopedics;  Laterality: Left;   TOTAL VAGINAL HYSTERECTOMY     Family History  Problem Relation Age of Onset   Stroke Mother    Heart disease Father    Heart attack Father    Hypertension Father    Heart attack Brother    Emphysema Daughter    Emphysema Son    Hypertension Son    Colon cancer Neg Hx    Colon polyps Neg Hx    Social History   Socioeconomic History   Marital status: Married    Spouse name: Kipp BroodBrent   Number of children: Not on file   Years of education: Not on file   Highest education level: High school graduate  Occupational History   Occupation: Retired  Tobacco Use   Smoking status: Former    Packs/day: 1.50    Years: 35.00    Pack years: 52.50    Types: Cigarettes    Quit date: 04/10/2003    Years since quitting: 18.4   Smokeless tobacco: Never  Vaping Use   Vaping Use: Never used  Substance  and Sexual Activity   Alcohol use: No   Drug use: No   Sexual activity: Not on file  Other Topics Concern   Not on file  Social History Narrative   Married 22 years in 2nd marriage.First marriage lasted 20 years.Retired,now a Education officer, [employer].   Social Determinants of Health   Financial Resource Strain: Low Risk    Difficulty of Paying Living Expenses: Not hard at all  Food Insecurity: No Food Insecurity   Worried About Programme researcher, broadcasting/film/videounning Out of Food in the Last Year: Never true   Ran Out of Food in the Last Year: Never true  Transportation Needs: No Transportation Needs   Lack of Transportation (Medical): No   Lack of Transportation (Non-Medical): No  Physical Activity: Sufficiently Active   Days of Exercise per Week: 5 days   Minutes of Exercise per Session: 30 min  Stress: No Stress Concern Present   Feeling of Stress : Not at all  Social Connections: Moderately Isolated   Frequency of Communication with Friends and Family: More than three times a week   Frequency of Social Gatherings with Friends and Family: More than three times a week   Attends Religious Services: Never   Database administrator or Organizations: No   Attends Engineer, structural: Never   Marital Status: Married    Tobacco Counseling Counseling given: Not Answered   Clinical Intake:  Pre-visit preparation completed: Yes  Pain : No/denies pain     BMI - recorded: 29.08 Nutritional Status: BMI 25 -29 Overweight Nutritional Risks: None Diabetes: No  How often do you need to have someone help you when you read instructions, pamphlets, or other written materials from your doctor or pharmacy?: 1 - Never  Diabetic?NO  Interpreter Needed?: No  Information entered by :: mj Mayan Kloepfer, lpn   Activities of Daily Living    09/05/2021    1:48 PM 02/10/2021   11:06 AM  In your present state of health, do you have any difficulty performing the following activities:  Hearing? 0   Vision? 0   Difficulty  concentrating or making decisions? 0   Walking or climbing stairs? 0   Dressing or bathing? 0   Doing errands, shopping? 0 0  Preparing Food and eating ? N   Using the Toilet? N   In the past six months, have you accidently leaked urine? Y   Comment stress incontinence.   Do you have problems with loss of bowel control? N   Managing your Medications? N   Managing your Finances? N   Housekeeping or managing your Housekeeping? N     Patient Care Team: Tommie Sams, DO as PCP - General (Family Medicine) Lanelle Bal, DO as Consulting Physician (Gastroenterology)  Indicate any recent Medical Services you may have received from other than [facility] providers in the past year (date may be approximate).     Assessment:   This is a routine wellness examination for Zanya.  Hearing/Vision screen Hearing Screening - Comments:: No hearing issues.  Vision Screening - Comments:: Readers. 06/2021. [facility] in [place].  Dietary issues and exercise activities discussed: Current Exercise Habits: Home exercise routine, Type of exercise: walking, Time (Minutes): 30, Frequency (Times/Week): 5, Weekly Exercise (Minutes/Week): 150, Intensity: Mild, Exercise limited by: cardiac condition(s);respiratory conditions(s);orthopedic condition(s)   Goals Addressed             This Visit's Progress    Exercise 3x per week (30 min per time)       Continue to stay active and healthy.  Continue to go Rving.     Weight (lb) < 145 lb (65.8 kg)   144 lb (65.3 kg)      Depression Screen    09/05/2021    1:42 PM 01/23/2021   11:00 AM 08/29/2020   10:54 AM 06/13/2020    2:21 PM 08/20/2019    9:09 AM 07/22/2019    9:09 AM 06/17/2019    2:11 PM  PHQ 2/9 Scores  PHQ - 2 Score 0 0 0 2 0 0 0  PHQ- 9 Score    2 0    Exception Documentation      Medical reason Medical reason    Fall Risk  09/05/2021    1:47 PM 01/23/2021   10:59 AM 08/29/2020   10:54 AM 06/13/2020    2:22 PM 08/20/2019    9:09 AM   Fall Risk   Falls in the past year? 0 0 0 1 0  Number falls in past yr: 0 0 0 1 0  Injury with Fall? 0 0 0 0 0  Risk for fall due to : Impaired balance/gait;Impaired mobility No Fall Risks No Fall Risks    Follow up Falls prevention discussed Falls evaluation completed Falls evaluation completed;Falls prevention discussed      FALL RISK PREVENTION PERTAINING TO THE HOME:  Any stairs in or around the home? No  If so, are there any without handrails? No  Home free of loose throw rugs in walkways, pet beds, electrical cords, etc? Yes  Adequate lighting in your home to reduce risk of falls? Yes   ASSISTIVE DEVICES UTILIZED TO PREVENT FALLS:  Life alert? No  Use of a cane, walker or w/c? No  Grab bars in the bathroom? Yes  Shower chair or bench in shower? Yes  Elevated toilet seat or a handicapped toilet? Yes   TIMED UP AND GO:  Was the test performed? No .  Phone visit.  Cognitive Function:        09/05/2021    1:50 PM  6CIT Screen  What Year? 0 points  What month? 0 points  What time? 0 points  Count back from 20 0 points  Months in reverse 0 points  Repeat phrase 0 points  Total Score 0 points    Immunizations Immunization History  Administered Date(s) Administered   Fluad Quad(high Dose 65+) 01/21/2019, 03/15/2020, 01/23/2021   Moderna Sars-Covid-2 Vaccination 02/13/2020   PFIZER(Purple Top)SARS-COV-2 Vaccination 05/25/2019, 06/22/2019   Pneumococcal Conjugate-13 08/29/2020   Pneumococcal Polysaccharide-23 05/03/2010, 01/21/2019   Tdap 07/22/2019   Zoster, Live 04/09/2013    TDAP status: Up to date  Flu Vaccine status: Up to date  Pneumococcal vaccine status: Up to date  Covid-19 vaccine status: Completed vaccines  Qualifies for Shingles Vaccine? Yes   Zostavax completed No   Shingrix Completed?: No.    Education has been provided regarding the importance of this vaccine. Patient has been advised to call insurance company to determine out of pocket  expense if they have not yet received this vaccine. Advised may also receive vaccine at local pharmacy or Health Dept. Verbalized acceptance and understanding.  Screening Tests Health Maintenance  Topic Date Due   MAMMOGRAM  06/24/2020   COLONOSCOPY (Pts 45-66yrs Insurance coverage will need to be confirmed)  09/15/2021 (Originally 12/18/2014)   COVID-19 Vaccine (4 - Booster) 09/21/2021 (Originally 04/09/2020)   Zoster Vaccines- Shingrix (1 of 2) 04/06/2022 (Originally 12/29/1964)   INFLUENZA VACCINE  11/07/2021   TETANUS/TDAP  07/21/2029   Pneumonia Vaccine 53+ Years old  Completed   DEXA SCAN  Completed   Hepatitis C Screening  Completed   HPV VACCINES  Aged Out    Health Maintenance  Health Maintenance Due  Topic Date Due   MAMMOGRAM  06/24/2020    Colorectal cancer screening: Type of screening: Colonoscopy. Completed 12/17/2004. Repeat every 10 years SCHEDULED FOR 09/11/2021.  Mammogram status: Ordered 09/05/2021. Pt provided with contact info and advised to call to schedule appt.   Bone Density status: Completed 08/04/2019. Results reflect: Bone density results: OSTEOPOROSIS. Repeat every 2 years.  Lung Cancer Screening: (Low Dose CT Chest recommended if Age 61-80 years, 30 pack-year currently smoking OR have quit  w/in 15years.) does not qualify.   Additional Screening:  Hepatitis C Screening: does qualify; Completed 07/23/2019  Vision Screening: Recommended annual ophthalmology exams for early detection of glaucoma and other disorders of the eye. Is the patient up to date with their annual eye exam?  Yes  Who is the provider or what is the name of the office in which the patient attends annual eye exams? [facility] in [place] If pt is not established with a provider, would they like to be referred to a provider to establish care? No .   Dental Screening: Recommended annual dental exams for proper oral hygiene  Community Resource Referral / Chronic Care Management: CRR  required this visit?  No   CCM required this visit?  No      Plan:     I have personally reviewed and noted the following in the patient's chart:   Medical and social history Use of alcohol, tobacco or illicit drugs  Current medications and supplements including opioid prescriptions.  Functional ability and status Nutritional status Physical activity Advanced directives List of other physicians Hospitalizations, surgeries, and ER visits in previous 12 months Vitals Screenings to include cognitive, depression, and falls Referrals and appointments  In addition, I have reviewed and discussed with patient certain preventive protocols, quality metrics, and best practice recommendations. A written personalized care plan for preventive services as well as general preventive health recommendations were provided to patient.     [name] Dash, LPN   0/34/7425   Nurse Notes: Discussed Shingrix and how to obtain. Discussed mammogram and pt would like to repeat, order placed. Pt would like to wait on DEXA at this time.

## 2021-09-06 NOTE — Patient Instructions (Signed)
[name] [name]  09/06/2021     @PREFPERIOPPHARMACY @   Your procedure is scheduled on  09/11/2021.   Report to 11/11/2021 at  0800 A.M.   Call this number if you have problems the morning of surgery:  [phone]   Remember:  Follow the diet and prep instructions given to you by the office.    Take these medicines the morning of surgery with A SIP OF WATER                      amlodipine, levothyroxine, protonix.     Do not wear jewelry, make-up or nail polish.  Do not wear lotions, powders, or perfumes, or deodorant.  Do not shave 48 hours prior to surgery.  Men may shave face and neck.  Do not bring valuables to the hospital.  [facility] is not responsible for any belongings or valuables.  Contacts, dentures or bridgework may not be worn into surgery.  Leave your suitcase in the car.  After surgery it may be brought to your room.  For patients admitted to the hospital, discharge time will be determined by your treatment team.  Patients discharged the day of surgery will not be allowed to drive home and must have someone with them for 24 hours.    Special instructions:   DO NOT smoke tobacco or vape for 24 hours before your procedure.  Please read over the following fact sheets that you were given. Anesthesia Post-op Instructions and Care and Recovery After Surgery      Colonoscopy, Adult, Care After The following information offers guidance on how to care for yourself after your procedure. Your health care provider may also give you more specific instructions. If you have problems or questions, contact your health care provider. What can I expect after the procedure? After the procedure, it is common to have: A small amount of blood in your stool for 24 hours after the procedure. Some gas. Mild cramping or bloating of your abdomen. Follow these instructions at home: Eating and drinking  Drink enough fluid to keep your urine pale yellow. Follow  instructions from your health care provider about eating or drinking restrictions. Resume your normal diet as told by your health care provider. Avoid heavy or fried foods that are hard to digest. Activity Rest as told by your health care provider. Avoid sitting for a long time without moving. Get up to take short walks every 1-2 hours. This is important to improve blood flow and breathing. Ask for help if you feel weak or unsteady. Return to your normal activities as told by your health care provider. Ask your health care provider what activities are safe for you. Managing cramping and bloating  Try walking around when you have cramps or feel bloated. If directed, apply heat to your abdomen as told by your health care provider. Use the heat source that your health care provider recommends, such as a moist heat pack or a heating pad. Place a towel between your skin and the heat source. Leave the heat on for 20-30 minutes. Remove the heat if your skin turns bright red. This is especially important if you are unable to feel pain, heat, or cold. You have a greater risk of getting burned. General instructions If you were given a sedative during the procedure, it can affect you for several hours. Do not drive or operate machinery until your health care provider says that it  is safe. For the first 24 hours after the procedure: Do not sign important documents. Do not drink alcohol. Do your regular daily activities at a slower pace than normal. Eat soft foods that are easy to digest. Take over-the-counter and prescription medicines only as told by your health care provider. Keep all follow-up visits. This is important. Contact a health care provider if: You have blood in your stool 2-3 days after the procedure. Get help right away if: You have more than a small spotting of blood in your stool. You have large blood clots in your stool. You have swelling of your abdomen. You have nausea or  vomiting. You have a fever. You have increasing pain in your abdomen that is not relieved with medicine. These symptoms may be an emergency. Get help right away. Call 911. Do not wait to see if the symptoms will go away. Do not drive yourself to the hospital. Summary After the procedure, it is common to have a small amount of blood in your stool. You may also have mild cramping and bloating of your abdomen. If you were given a sedative during the procedure, it can affect you for several hours. Do not drive or operate machinery until your health care provider says that it is safe. Get help right away if you have a lot of blood in your stool, nausea or vomiting, a fever, or increased pain in your abdomen. This information is not intended to replace advice given to you by your health care provider. Make sure you discuss any questions you have with your health care provider. Document Revised: 11/16/2020 Document Reviewed: 11/16/2020 Elsevier Patient Education  2023 Elsevier Inc. Monitored Anesthesia Care, Care After This sheet gives you information about how to care for yourself after your procedure. Your health care provider may also give you more specific instructions. If you have problems or questions, contact your health care provider. What can I expect after the procedure? After the procedure, it is common to have: Tiredness. Forgetfulness about what happened after the procedure. Impaired judgment for important decisions. Nausea or vomiting. Some difficulty with balance. Follow these instructions at home: For the time period you were told by your health care provider:     Rest as needed. Do not participate in activities where you could fall or become injured. Do not drive or use machinery. Do not drink alcohol. Do not take sleeping pills or medicines that cause drowsiness. Do not make important decisions or sign legal documents. Do not take care of children on your own. Eating and  drinking Follow the diet that is recommended by your health care provider. Drink enough fluid to keep your urine pale yellow. If you vomit: Drink water, juice, or soup when you can drink without vomiting. Make sure you have little or no nausea before eating solid foods. General instructions Have a responsible adult stay with you for the time you are told. It is important to have someone help care for you until you are awake and alert. Take over-the-counter and prescription medicines only as told by your health care provider. If you have sleep apnea, surgery and certain medicines can increase your risk for breathing problems. Follow instructions from your health care provider about wearing your sleep device: Anytime you are sleeping, including during daytime naps. While taking prescription pain medicines, sleeping medicines, or medicines that make you drowsy. Avoid smoking. Keep all follow-up visits as told by your health care provider. This is important. Contact a health care provider  if: You keep feeling nauseous or you keep vomiting. You feel light-headed. You are still sleepy or having trouble with balance after 24 hours. You develop a rash. You have a fever. You have redness or swelling around the IV site. Get help right away if: You have trouble breathing. You have new-onset confusion at home. Summary For several hours after your procedure, you may feel tired. You may also be forgetful and have poor judgment. Have a responsible adult stay with you for the time you are told. It is important to have someone help care for you until you are awake and alert. Rest as told. Do not drive or operate machinery. Do not drink alcohol or take sleeping pills. Get help right away if you have trouble breathing, or if you suddenly become confused. This information is not intended to replace advice given to you by your health care provider. Make sure you discuss any questions you have with your  health care provider. Document Revised: 02/28/2021 Document Reviewed: 02/26/2019 Elsevier Patient Education  2023 [employer]

## 2021-09-07 ENCOUNTER — Encounter (HOSPITAL_COMMUNITY): Payer: Self-pay

## 2021-09-08 ENCOUNTER — Encounter (HOSPITAL_COMMUNITY)
Admission: RE | Admit: 2021-09-08 | Discharge: 2021-09-08 | Disposition: A | Discharge status: Home / Self Care | Payer: Medicare HMO | Source: Ambulatory Visit | Attending: Internal Medicine | Admitting: Internal Medicine

## 2021-09-11 ENCOUNTER — Ambulatory Visit (HOSPITAL_COMMUNITY): Admission: RE | Admit: 2021-09-11 | Payer: Medicare HMO | Source: Home / Self Care

## 2021-09-11 SURGERY — COLONOSCOPY WITH PROPOFOL
Anesthesia: Monitor Anesthesia Care

## 2021-09-11 NOTE — Progress Notes (Signed)
Patient called to cancel , states she will reschedule.

## 2021-09-12 ENCOUNTER — Telehealth: Payer: Self-pay | Admitting: *Deleted

## 2021-09-12 DIAGNOSIS — M25551 Pain in right hip: Secondary | ICD-10-CM | POA: Diagnosis not present

## 2021-09-12 DIAGNOSIS — M1711 Unilateral primary osteoarthritis, right knee: Secondary | ICD-10-CM | POA: Diagnosis not present

## 2021-09-12 NOTE — Telephone Encounter (Signed)
LMTCB to schedule TCS, Dr. Marletta Lor, ASA 3

## 2021-09-12 NOTE — Telephone Encounter (Signed)
-----   Message from Nils Flack sent at 09/12/2021  8:06 AM EDT ----- Regarding: Pt canceled procedure yesterday Good Morning [name] [name],  The above referenced patient didn't show for her procedure, her husband said she was going to reschedule. I put her in the depot.  Thanks,  Lorre Nick.

## 2021-09-14 ENCOUNTER — Other Ambulatory Visit (HOSPITAL_COMMUNITY): Payer: Self-pay | Admitting: Family Medicine

## 2021-09-14 DIAGNOSIS — N6489 Other specified disorders of breast: Secondary | ICD-10-CM

## 2021-09-14 NOTE — Telephone Encounter (Signed)
LMOVM to call back 

## 2021-09-28 ENCOUNTER — Other Ambulatory Visit (HOSPITAL_COMMUNITY): Payer: Medicare HMO

## 2021-09-28 ENCOUNTER — Encounter (HOSPITAL_COMMUNITY): Payer: Medicare HMO

## 2021-10-12 ENCOUNTER — Ambulatory Visit (HOSPITAL_COMMUNITY)
Admission: RE | Admit: 2021-10-12 | Discharge: 2021-10-12 | Disposition: A | Discharge status: Home / Self Care | Payer: Medicare HMO | Source: Ambulatory Visit | Attending: Family Medicine | Admitting: Family Medicine

## 2021-10-12 DIAGNOSIS — N6489 Other specified disorders of breast: Secondary | ICD-10-CM

## 2021-10-12 DIAGNOSIS — R928 Other abnormal and inconclusive findings on diagnostic imaging of breast: Secondary | ICD-10-CM | POA: Diagnosis not present

## 2021-11-07 ENCOUNTER — Other Ambulatory Visit: Payer: Self-pay | Admitting: Family Medicine

## 2021-11-20 ENCOUNTER — Ambulatory Visit (INDEPENDENT_AMBULATORY_CARE_PROVIDER_SITE_OTHER): Payer: Medicare HMO | Admitting: Family Medicine

## 2021-11-20 VITALS — Ht 59.0 in | Wt 136.8 lb

## 2021-11-20 DIAGNOSIS — D72829 Elevated white blood cell count, unspecified: Secondary | ICD-10-CM

## 2021-11-20 DIAGNOSIS — R7303 Prediabetes: Secondary | ICD-10-CM | POA: Insufficient documentation

## 2021-11-20 DIAGNOSIS — M810 Age-related osteoporosis without current pathological fracture: Secondary | ICD-10-CM | POA: Diagnosis not present

## 2021-11-20 DIAGNOSIS — E039 Hypothyroidism, unspecified: Secondary | ICD-10-CM

## 2021-11-20 NOTE — Patient Instructions (Signed)
Vitamin D - 2000 IU daily.  1200 mg of Calcium daily (including diet).  Labs today.  Continue your me

## 2021-11-21 LAB — CBC WITH DIFFERENTIAL/PLATELET
Basophils Absolute: 0 10*3/uL (ref 0.0–0.2)
Basos: 0 %
EOS (ABSOLUTE): 0.2 10*3/uL (ref 0.0–0.4)
Eos: 3 %
Hematocrit: 40.8 % (ref 34.0–46.6)
Hemoglobin: 12.8 g/dL (ref 11.1–15.9)
Immature Grans (Abs): 0 10*3/uL (ref 0.0–0.1)
Immature Granulocytes: 0 %
Lymphocytes Absolute: 1.5 10*3/uL (ref 0.7–3.1)
Lymphs: 16 %
MCH: 25.4 pg — ABNORMAL LOW (ref 26.6–33.0)
MCHC: 31.4 g/dL — ABNORMAL LOW (ref 31.5–35.7)
MCV: 81 fL (ref 79–97)
Monocytes Absolute: 0.8 10*3/uL (ref 0.1–0.9)
Monocytes: 8 %
Neutrophils Absolute: 7 10*3/uL (ref 1.4–7.0)
Neutrophils: 73 %
Platelets: 355 10*3/uL (ref 150–450)
RBC: 5.03 x10E6/uL (ref 3.77–5.28)
RDW: 14.7 % (ref 11.7–15.4)
WBC: 9.6 10*3/uL (ref 3.4–10.8)

## 2021-11-21 LAB — TSH: TSH: 0.753 u[IU]/mL (ref 0.450–4.500)

## 2021-11-21 NOTE — Assessment & Plan Note (Signed)
TSH was obtained and was normal.  Continue current dosing of Synthroid, 88 mcg daily.

## 2021-11-21 NOTE — Progress Notes (Signed)
Subjective:  Patient ID: [name] [name], female    DOB: 12/27/1945  Age: 76 y.o. MRN: [id number]  CC: Chief Complaint  Patient presents with   Hypertension    Follow up Patient had recent shoulder replacement and did really well Discuss bone density and need for medication    HPI:  76 year old female presents for follow-up.  Patient states that she is doing well at this time.  Had recent shoulder surgery.  Has a history of osteoporosis.  We have discussed starting treatment in the past.  She would like to revisit this today.  Patient's most recent TSH was abnormal.  Need to recheck.  She is compliant with levothyroxine 88 mcg daily.  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      Social Hx   Social History   Socioeconomic History   Marital status: Married    Spouse name: Kipp Brood   Number of children: Not on file   Years of education: Not on file   Highest education level: High school graduate  Occupational History   Occupation: Retired  Tobacco Use   Smoking status: Former    Packs/day: 1.50    Years: 35.00    Total pack years: 52.50    Types: Cigarettes    Quit date: 04/10/2003    Years since quitting: 18.6   Smokeless tobacco: Never  Vaping Use   Vaping Use: Never used  Substance and Sexual Activity   Alcohol use: No   Drug use: No   Sexual activity: Not on file  Other Topics Concern   Not on file  Social History Narrative   Married 22 years in 2nd marriage.First marriage lasted 20 years.Retired,now a Education officer, environmental.   Social  Determinants of Health   Financial Resource Strain: Low Risk  (09/05/2021)   Overall Financial Resource Strain (CARDIA)    Difficulty of Paying Living Expenses: Not hard at all  Food Insecurity: No Food Insecurity (09/05/2021)   Hunger Vital Sign    Worried About Running Out of Food in the Last Year: Never true    Ran Out of Food in the Last Year: Never true  Transportation Needs: No Transportation Needs (09/05/2021)   PRAPARE - Administrator, Civil Service (Medical): No    Lack of Transportation (Non-Medical): No  Physical Activity: Sufficiently Active (09/05/2021)   Exercise Vital Sign    Days of Exercise per Week: 5 days    Minutes of Exercise per Session: 30 min  Stress: No Stress Concern Present (09/05/2021)   [employer] of Occupational Health - Occupational Stress Questionnaire    Feeling of Stress : Not at all  Social Connections: Moderately Isolated (09/05/2021)   Social Connection and Isolation Panel [NHANES]    Frequency of Communication with Friends and Family: More than three times a week    Frequency of Social Gatherings with Friends and Family: More than three times a week    Attends Religious Services: Never    Database administrator or Organizations: No    Attends Banker Meetings:  Never    Marital Status: Married    Review of Systems  Constitutional: Negative.   Cardiovascular: Negative.    Objective:  Ht 4\' 11"  (1.499 m)   Wt 136 lb 12.8 oz (62.1 kg)   BMI 27.63 kg/m      11/20/2021    2:23 PM 09/05/2021    1:38 PM 08/17/2021    9:26 AM  BP/Weight  Systolic BP   135  Diastolic BP   68  Wt. (Lbs) 136.8 144 144.8  BMI 27.63 kg/m2 29.08 kg/m2 29.25 kg/m2    Physical Exam Constitutional:      General: She is not in acute distress.    Appearance: Normal appearance.  HENT:     Head: Normocephalic and atraumatic.  Cardiovascular:     Rate and Rhythm: Normal rate and regular rhythm.  Pulmonary:     Effort: Pulmonary effort  is normal.     Breath sounds: Normal breath sounds. No wheezing or rales.  Neurological:     Mental Status: She is alert.  Psychiatric:        Mood and Affect: Mood normal.        Behavior: Behavior normal.     Lab Results  Component Value Date   WBC 9.6 11/20/2021   HGB 12.8 11/20/2021   HCT 40.8 11/20/2021   PLT 355 11/20/2021   GLUCOSE 83 06/20/2021   CHOL 197 06/19/2021   TRIG 101 06/19/2021   HDL 56 06/19/2021   LDLCALC 123 (H) 06/19/2021   ALT 21 06/19/2021   AST 21 06/19/2021   NA 138 06/20/2021   K 4.7 06/20/2021   CL 103 06/20/2021   CREATININE 0.98 06/20/2021   BUN 28 (H) 06/20/2021   CO2 27 06/20/2021   TSH 0.753 11/20/2021   INR 0.90 08/14/2013   HGBA1C 6.1 (H) 06/19/2021     Assessment & Plan:   Problem List Items Addressed This Visit       Endocrine   Hypothyroidism    TSH was obtained and was normal.  Continue current dosing of Synthroid, 88 mcg daily.      Relevant Orders   TSH (Completed)     Musculoskeletal and Integument   Age-related osteoporosis without current pathological fracture    We discussed medications for osteoporosis.  Also discussed supplementation with calcium and vitamin D.  Patient wants to just proceed with calcium and vitamin D supplementation.  Does not desire medication at this time.        Other   RESOLVED: Leukocytosis - Primary   Relevant Orders   CBC with Differential (Completed)    Follow-up:  Return in about 6 months (around 05/23/2022).  05/25/2022 DO [facility]

## 2021-11-21 NOTE — Assessment & Plan Note (Signed)
We discussed medications for osteoporosis.  Also discussed supplementation with calcium and vitamin D.  Patient wants to just proceed with calcium and vitamin D supplementation.  Does not desire medication at this time.

## 2021-11-29 ENCOUNTER — Other Ambulatory Visit: Payer: Self-pay | Admitting: Family Medicine

## 2021-12-16 ENCOUNTER — Other Ambulatory Visit: Payer: Self-pay | Admitting: Internal Medicine

## 2021-12-16 DIAGNOSIS — R058 Other specified cough: Secondary | ICD-10-CM

## 2022-01-24 ENCOUNTER — Other Ambulatory Visit: Payer: Self-pay | Admitting: Family Medicine

## 2022-01-24 DIAGNOSIS — E039 Hypothyroidism, unspecified: Secondary | ICD-10-CM

## 2022-02-05 DIAGNOSIS — M1711 Unilateral primary osteoarthritis, right knee: Secondary | ICD-10-CM | POA: Diagnosis not present

## 2022-02-07 ENCOUNTER — Other Ambulatory Visit: Payer: Self-pay | Admitting: Family Medicine

## 2022-02-07 DIAGNOSIS — I10 Essential (primary) hypertension: Secondary | ICD-10-CM

## 2022-02-07 DIAGNOSIS — G2581 Restless legs syndrome: Secondary | ICD-10-CM

## 2022-02-08 DIAGNOSIS — M1711 Unilateral primary osteoarthritis, right knee: Secondary | ICD-10-CM | POA: Diagnosis not present

## 2022-02-19 ENCOUNTER — Ambulatory Visit: Payer: Medicare HMO | Admitting: Family Medicine

## 2022-02-20 ENCOUNTER — Encounter: Payer: Self-pay | Admitting: Family Medicine

## 2022-02-20 ENCOUNTER — Ambulatory Visit (INDEPENDENT_AMBULATORY_CARE_PROVIDER_SITE_OTHER): Payer: Medicare HMO | Admitting: Family Medicine

## 2022-02-20 VITALS — BP 115/63 | HR 55 | Temp 97.5°F | Wt 141.0 lb

## 2022-02-20 DIAGNOSIS — J449 Chronic obstructive pulmonary disease, unspecified: Secondary | ICD-10-CM

## 2022-02-20 DIAGNOSIS — E039 Hypothyroidism, unspecified: Secondary | ICD-10-CM

## 2022-02-20 DIAGNOSIS — Z130 Encounter for screening for diseases of the blood and blood-forming organs and certain disorders involving the immune mechanism: Secondary | ICD-10-CM

## 2022-02-20 DIAGNOSIS — R7303 Prediabetes: Secondary | ICD-10-CM | POA: Diagnosis not present

## 2022-02-20 DIAGNOSIS — E785 Hyperlipidemia, unspecified: Secondary | ICD-10-CM | POA: Insufficient documentation

## 2022-02-20 DIAGNOSIS — I10 Essential (primary) hypertension: Secondary | ICD-10-CM

## 2022-02-20 DIAGNOSIS — Z01818 Encounter for other preprocedural examination: Secondary | ICD-10-CM | POA: Insufficient documentation

## 2022-02-20 DIAGNOSIS — G8929 Other chronic pain: Secondary | ICD-10-CM

## 2022-02-20 DIAGNOSIS — R519 Headache, unspecified: Secondary | ICD-10-CM | POA: Insufficient documentation

## 2022-02-20 MED ORDER — AMITRIPTYLINE HCL 75 MG PO TABS: 75.0000 mg | ORAL_TABLET | Freq: Every day | ORAL | 1 refills | Status: DC

## 2022-02-20 NOTE — Assessment & Plan Note (Signed)
EKG performed today.  Labs today.   Low risk per NSQIP. Form filled out for surgery.

## 2022-02-20 NOTE — Assessment & Plan Note (Signed)
Well-controlled.  Continue amlodipine.  Labs today.

## 2022-02-20 NOTE — Assessment & Plan Note (Signed)
Previously did well on propranolol.  However, bradycardic currently.  As result cannot use propranolol.  Increasing amitriptyline.

## 2022-02-20 NOTE — Assessment & Plan Note (Signed)
Mild.  No meds at this time.  Lungs clear.

## 2022-02-20 NOTE — Patient Instructions (Addendum)
Try the increase in Amitriptyline   We will fax the clearance.  Best of luck  Dr. Adriana Simas

## 2022-02-20 NOTE — Progress Notes (Signed)
Subjective:  Patient ID: [name] [name], female    DOB: March 02, 1946  Age: 76 y.o. MRN: [id number]  CC: Chief Complaint  Patient presents with   Surgical Clearance    Pt arrives for surgical clearance. Pt will be having a partial right knee replacement. Pt has been headaches and vision has worsened. Pt has been to eye doctor.     HPI:  76 year old female with hypertension, hypothyroidism, osteoporosis, prediabetes, hyperlipidemia, mild COPD presents for surgical clearance.  Patient is going to have upcoming right partial knee replacement.  She is in need of labs and a physical exam today.  Needs form to be filled out for surgery.  Patient has COPD.  This is mild.  Diagnosed mainly based on imaging.  She is on no prescribed inhalers currently.  She has not needed any medication.  Hypertension is well controlled on Norvasc.   She reports frequent diffuse headaches. Previously did well on Propranolol. No currently taking. Associated nausea.   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      Social Hx   Social History   Socioeconomic History   Marital status: Married    Spouse name: Ruby Cola   Number of children: Not on file   Years of education: Not on file   Highest education level: High school graduate  Occupational History   Occupation: Retired  Tobacco Use   Smoking status: Former    Packs/day: 1.50    Years: 35.00    Total pack years: 52.50     Types: Cigarettes    Quit date: 04/10/2003    Years since quitting: 18.8   Smokeless tobacco: Never  Vaping Use   Vaping Use: Never used  Substance and Sexual Activity   Alcohol use: No   Drug use: No   Sexual activity: Not on file  Other Topics Concern   Not on file  Social History Narrative   Married 22 years in 2nd marriage.First marriage lasted 40 years.Retired,now a Theme park manager.   Social Determinants of Health   Financial Resource Strain: Low Risk  (09/05/2021)   Overall Financial Resource Strain (CARDIA)    Difficulty of Paying Living Expenses: Not hard at all  Food Insecurity: No Food Insecurity (09/05/2021)   Hunger Vital Sign    Worried About Running Out of Food in the Last Year: Never true    Ran Out of Food in the Last Year: Never true  Transportation Needs: No Transportation Needs (09/05/2021)   PRAPARE - Hydrologist (Medical): No    Lack of Transportation (Non-Medical): No  Physical Activity: Sufficiently Active (09/05/2021)   Exercise Vital Sign    Days of Exercise per Week: 5 days    Minutes of Exercise per Session: 30 min  Stress: No Stress Concern Present (09/05/2021)   [place]    Feeling of Stress : Not at all  Social Connections: Moderately Isolated (09/05/2021)   Social Connection  and Isolation Panel [NHANES]    Frequency of Communication with Friends and Family: More than three times a week    Frequency of Social Gatherings with Friends and Family: More than three times a week    Attends Religious Services: Never    Marine scientist or Organizations: No    Attends Music therapist: Never    Marital Status: Married    Review of Systems Per HPI  Objective:  BP 115/63   Pulse (!) 55   Temp (!) 97.5 F (36.4 C)   Wt 141 lb (64 kg)   SpO2 98%   BMI 28.48 kg/m      02/20/2022   10:53 AM 11/20/2021    2:23 PM 09/05/2021    1:38 PM   BP/Weight  Systolic BP 381    Diastolic BP 63    Wt. (Lbs) 141 136.8 144  BMI 28.48 kg/m2 27.63 kg/m2 29.08 kg/m2    Physical Exam Vitals and nursing note reviewed.  Constitutional:      General: She is not in acute distress.    Appearance: Normal appearance.  HENT:     Head: Normocephalic and atraumatic.  Eyes:     General:        Right eye: No discharge.        Left eye: No discharge.     Conjunctiva/sclera: Conjunctivae normal.  Cardiovascular:     Rate and Rhythm: Regular rhythm. Bradycardia present.  Pulmonary:     Effort: Pulmonary effort is normal.     Breath sounds: Normal breath sounds. No wheezing, rhonchi or rales.  Neurological:     Mental Status: She is alert.  Psychiatric:        Mood and Affect: Mood normal.        Behavior: Behavior normal.     Lab Results  Component Value Date   WBC 9.6 11/20/2021   HGB 12.8 11/20/2021   HCT 40.8 11/20/2021   PLT 355 11/20/2021   GLUCOSE 83 06/20/2021   CHOL 197 06/19/2021   TRIG 101 06/19/2021   HDL 56 06/19/2021   LDLCALC 123 (H) 06/19/2021   ALT 21 06/19/2021   AST 21 06/19/2021   NA 138 06/20/2021   K 4.7 06/20/2021   CL 103 06/20/2021   CREATININE 0.98 06/20/2021   BUN 28 (H) 06/20/2021   CO2 27 06/20/2021   TSH 0.753 11/20/2021   INR 0.90 08/14/2013   HGBA1C 6.1 (H) 06/19/2021   EKG: Interpretation -sinus bradycardia the rate of 53.  Normal axis.  No ST or T wave changes.  Unremarkable EKG.  Assessment & Plan:   Problem List Items Addressed This Visit       Cardiovascular and Mediastinum   Essential hypertension, benign    Well-controlled.  Continue amlodipine.  Labs today.      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  No meds at this time.  Lungs clear.        Endocrine   Hypothyroidism   Relevant Orders   TSH     Other   Chronic headaches    Previously did well on propranolol.  However, bradycardic currently.  As result cannot use  propranolol.  Increasing amitriptyline.      Relevant Medications   amitriptyline (ELAVIL) 75 MG tablet   Hyperlipidemia   Relevant Orders   Lipid panel   Pre-operative clearance - Primary  EKG performed today.  Labs today.   Low risk per NSQIP. Form filled out for surgery.      Relevant Orders   EKG 12-Lead (Completed)   Prediabetes   Relevant Orders   Hemoglobin A1c   Other Visit Diagnoses     Screening for deficiency anemia       Relevant Orders   CBC       Meds ordered this encounter  Medications   amitriptyline (ELAVIL) 75 MG tablet    Sig: Take 1 tablet (75 mg total) by mouth daily.    Dispense:  90 tablet    Refill:  [place]

## 2022-03-21 ENCOUNTER — Telehealth: Payer: Self-pay | Admitting: Family Medicine

## 2022-03-21 NOTE — Telephone Encounter (Signed)
[facility] requesting refill on Alprazolam 0.5 mg tablet. Take one tablet po at bedtime prn. Pt last seen 02/20/22 for pre op clearance. Please advise. Thank you

## 2022-03-22 ENCOUNTER — Other Ambulatory Visit: Payer: Self-pay | Admitting: Family Medicine

## 2022-03-22 MED ORDER — ALPRAZOLAM 0.5 MG PO TABS: ORAL_TABLET | ORAL | 5 refills | Status: DC

## 2022-03-26 DIAGNOSIS — M1711 Unilateral primary osteoarthritis, right knee: Secondary | ICD-10-CM | POA: Diagnosis not present

## 2022-03-27 NOTE — H&P (Signed)
KNEE ARTHROPLASTY ADMISSION H&P  Patient ID: [name] [name] MRN: [id number] DOB/AGE: [birth year] 76 y.o.  Chief Complaint: right knee pain.  Planned Procedure Date: 04/16/22 Medical, Cardiac, and Pulmonary Clearance by Dr. Thersa Salt      HPI: [name] [name] is a 76 y.o. 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 The patient has a history of pain and functional disability in the right knee due to arthritis and has failed non-surgical conservative treatments for greater than 12 weeks to include NSAID's and/or analgesics, corticosteriod injections, use of assistive devices, and activity modification.  Onset of symptoms was gradual, starting 1 year ago with gradually worsening course since that time. The patient noted no past surgery on the right knee.  Patient currently rates pain at 8 out of 10 with activity. Patient has night pain, worsening of pain with activity and weight bearing, and pain that interferes with activities of daily living.  Patient has evidence of subchondral sclerosis, periarticular osteophytes, and joint space narrowing to the medial compartment of the right knee by imaging studies.  There is no active infection.  Past Medical History:  Diagnosis Date   COPD (chronic obstructive pulmonary disease) ([place])    Depression    Dyspnea    Essential hypertension, benign 01/21/2019   History of pneumonia    2010 on ventalator   History of recurrent UTIs    Hyperlipidemia    Hypothyroidism    Insomnia 12/18/2018   Left knee DJD    Osteoporosis    Pneumonia    PTSD (post-traumatic stress disorder)    Restless leg syndrome    Sleep apnea    Past Surgical History:  Procedure Laterality Date   ABDOMINAL HYSTERECTOMY     COLONOSCOPY  12/13/2004   Dr. Anthony Sar; Normal exam   KNEE ARTHROSCOPY WITH MEDIAL MENISECTOMY Left 08/24/2013   Procedure: LEFT KNEE ARTHROSCOPY WITH MEDIAL MENISCECTOMY  ;  Surgeon: Lorn Junes, MD;  Location: [place];  Service: Orthopedics;   Laterality: Left;   PARTIAL KNEE ARTHROPLASTY Left 08/24/2013   dr Telford Nab   PARTIAL KNEE ARTHROPLASTY Left 08/24/2013   Procedure: LEFT KNEE ARTHROPLASTY UNI COMPARTMENTAL ;  Surgeon: Lorn Junes, MD;  Location: [place];  Service: Orthopedics;  Laterality: Left;  Left knee arthroplasty uni compartment  With left knee scope medial meniscectomy     REVERSE SHOULDER ARTHROPLASTY Left 02/14/2021   Procedure: LEFT REVERSE SHOULDER ARTHROPLASTY;  Surgeon: Meredith Pel, MD;  Location: [place];  Service: Orthopedics;  Laterality: Left;   TOTAL VAGINAL HYSTERECTOMY     Allergies  Allergen Reactions   Codeine Itching   Prior to Admission medications   Medication Sig Start Date End Date Taking? Authorizing Provider  ALPRAZolam Duanne Moron) 0.5 MG tablet TAKE 1 TABLET AT BEDTIME AS NEEDED FOR ANXIETY 03/22/22   Coral Spikes, DO  amitriptyline (ELAVIL) 75 MG tablet Take 1 tablet (75 mg total) by mouth daily. 02/20/22   Coral Spikes, DO  amLODipine (NORVASC) 5 MG tablet TAKE 1 TABLET (5 MG TOTAL) BY MOUTH DAILY. 02/08/22   Coral Spikes, DO  Carboxymethylcellul-Glycerin (LUBRICATING EYE DROPS OP) Place 1 drop into both eyes daily as needed (dry eyes).    [provider]  gabapentin (NEURONTIN) 300 MG capsule TAKE 2 CAPSULES (600 MG TOTAL) BY MOUTH AT BEDTIME. 02/08/22   Coral Spikes, DO  levothyroxine (SYNTHROID) 88 MCG tablet TAKE 1 TABLET (88 MCG TOTAL) BY MOUTH DAILY. 01/24/22   Coral Spikes,  DO   Social History   Socioeconomic History   Marital status: Married    Spouse name: Kipp Brood   Number of children: Not on file   Years of education: Not on file   Highest education level: High school graduate  Occupational History   Occupation: Retired  Tobacco Use   Smoking status: Former    Packs/day: 1.50    Years: 35.00    Total pack years: 52.50    Types: Cigarettes    Quit date: 04/10/2003    Years since quitting: 18.9   Smokeless tobacco: Never  Vaping Use   Vaping Use: Never  used  Substance and Sexual Activity   Alcohol use: No   Drug use: No   Sexual activity: Not on file  Other Topics Concern   Not on file  Social History Narrative   Married 22 years in 2nd marriage.First marriage lasted 20 years.Retired,now a Education officer, environmental.   Social Determinants of Health   Financial Resource Strain: Low Risk  (09/05/2021)   Overall Financial Resource Strain (CARDIA)    Difficulty of Paying Living Expenses: Not hard at all  Food Insecurity: No Food Insecurity (09/05/2021)   Hunger Vital Sign    Worried About Running Out of Food in the Last Year: Never true    Ran Out of Food in the Last Year: Never true  Transportation Needs: No Transportation Needs (09/05/2021)   PRAPARE - Administrator, Civil Service (Medical): No    Lack of Transportation (Non-Medical): No  Physical Activity: Sufficiently Active (09/05/2021)   Exercise Vital Sign    Days of Exercise per Week: 5 days    Minutes of Exercise per Session: 30 min  Stress: No Stress Concern Present (09/05/2021)   [employer] of Occupational Health - Occupational Stress Questionnaire    Feeling of Stress : Not at all  Social Connections: Moderately Isolated (09/05/2021)   Social Connection and Isolation Panel [NHANES]    Frequency of Communication with Friends and Family: More than three times a week    Frequency of Social Gatherings with Friends and Family: More than three times a week    Attends Religious Services: Never    Database administrator or Organizations: No    Attends Engineer, structural: Never    Marital Status: Married   Family History  Problem Relation Age of Onset   Stroke Mother    Heart disease Father    Heart attack Father    Hypertension Father    Heart attack Brother    Emphysema Daughter    Emphysema Son    Hypertension Son    Colon cancer Neg Hx    Colon polyps Neg Hx     ROS: Currently denies lightheadedness, dizziness, Fever, chills, CP, SOB.   No personal  history of DVT, PE, MI, or CVA. No loose teeth or dentures All other systems have been reviewed and were otherwise currently negative with the exception of those mentioned in the HPI and as above.  Objective: Vitals: Ht: 5'1" Wt: 142.2 lbs Temp: 98 BP: 155/74 Pulse: 82 O2 95% on room air.   Physical Exam: General: Alert, NAD.  Antalgic Gait  HEENT: EOMI, Good Neck Extension  Pulm: No increased work of breathing.  Clear B/L A/P w/o crackle or wheeze.  CV: RRR, No m/g/r appreciated  GI: soft, NT, ND. BS x 4 quadrants Neuro: CN II-XII grossly intact without focal deficit.  Sensation intact distally Skin: No lesions in the area  of chief complaint MSK/Surgical Site:  + medial JLT. ROM 10-110 degrees.  5/5 strength in extension and flexion.  +EHL/FHL.  NVI.  Mostly stable varus and valgus stress.    Imaging Review Plain radiographs demonstrate moderate degenerative joint disease of the medial compartment of the right knee.   The overall alignment ismild varus. The bone quality appears to be fair for age and reported activity level.  Preoperative templating of the joint replacement has been completed, documented, and submitted to the Operating Room personnel in order to optimize intra-operative equipment management.  Assessment: OA RIGHT KNEE Active Problems:   * No active hospital problems. *   Plan: Plan for Procedure(s): UNICOMPARTMENTAL KNEE  The patient history, physical exam, clinical judgement of the provider and imaging are consistent with end stage degenerative joint disease and partial joint arthroplasty is deemed medically necessary. The treatment options including medical management, injection therapy, and arthroplasty were discussed at length. The risks and benefits of Procedure(s): UNICOMPARTMENTAL KNEE were presented and reviewed.  The risks of nonoperative treatment, versus surgical intervention including but not limited to continued pain, aseptic loosening, stiffness,  dislocation/subluxation, infection, bleeding, nerve injury, blood clots, cardiopulmonary complications, morbidity, mortality, among others were discussed. The patient verbalizes understanding and wishes to proceed with the plan.  Patient is being admitted for inpatient treatment for surgery, pain control, PT, prophylactic antibiotics, VTE prophylaxis, progressive ambulation, ADL's and discharge planning.   Dental prophylaxis discussed and recommended for 2 years postoperatively.  The patient does meet the criteria for TXA which will be used perioperatively.   ASA 81 mg BID will be used postoperatively for DVT prophylaxis in addition to SCDs, and early ambulation. Plan for Tylenol, Mobic, oxycodone for pain.   Zofran for nausea and vomiting. Senokot for constipation prevention [facility] in [place] The patient is planning to be discharged home with OPPT and into the care of her husband Ruby Cola who can be reached at [phone] Follow up appt 05/01/22 at 2:45pm     Alisa Graff Office [id number] 03/27/2022 4:02 PM

## 2022-04-03 NOTE — Patient Instructions (Signed)
SURGICAL WAITING ROOM VISITATION Patients having surgery or a procedure may have no more than 2 support people in the waiting area - these visitors may rotate in the visitor waiting room.   Due to an increase in RSV and influenza rates and associated hospitalizations, children ages 27 and under may not visit patients in [facility] hospitals. If the patient needs to stay at the hospital during part of their recovery, the visitor guidelines for inpatient rooms apply.  PRE-OP VISITATION  Pre-op nurse will coordinate an appropriate time for 1 support person to accompany the patient in pre-op.  This support person may not rotate.  This visitor will be contacted when the time is appropriate for the visitor to come back in the pre-op area.  Please refer to the [facility] website for the visitor guidelines for Inpatients (after your surgery is over and you are in a regular room).  You are not required to quarantine at this time prior to your surgery. However, you must do this: Hand Hygiene often Do NOT share personal items Notify your provider if you are in close contact with someone who has COVID or you develop fever 100.4 or greater, new onset of sneezing, cough, sore throat, shortness of breath or body aches.  If you test positive for Covid or have been in contact with anyone that has tested positive in the last 10 days please notify you surgeon.    Your procedure is scheduled on:  Monday April 16, 2022  Report to [facility] Main Entrance: [place] entrance where the [employer] is available.   Report to admitting at: 05:15  AM  +++++Call this number if you have any questions or problems the morning of surgery [phone]  Do not eat food after Midnight the night prior to your surgery/procedure.  After Midnight you may have the following liquids until   04:30  AM DAY OF SURGERY  Clear Liquid Diet Water Black Coffee (sugar ok, NO MILK/CREAM OR CREAMERS)  Tea (sugar ok, NO  MILK/CREAM OR CREAMERS) regular and decaf                             Plain Jell-O  with no fruit (NO RED)                                           Fruit ices (not with fruit pulp, NO RED)                                     Popsicles (NO RED)                                                                  Juice: apple, WHITE grape, WHITE cranberry Sports drinks like Gatorade or Powerade (NO RED)                   The day of surgery:  Drink ONE (1) Pre-Surgery G2 at 04:30 AM the morning of surgery. Drink in one sitting. Do not sip.  This drink was given to you during your hospital pre-op appointment visit. Nothing else to drink after completing the Pre-Surgery G2 : No candy, chewing gum or throat lozenges.    FOLLOW  ANY ADDITIONAL PRE OP INSTRUCTIONS YOU RECEIVED FROM YOUR SURGEON'S OFFICE!!!   Oral Hygiene is also important to reduce your risk of infection.        Remember - BRUSH YOUR TEETH THE MORNING OF SURGERY WITH YOUR REGULAR TOOTHPASTE  Do NOT smoke after Midnight the night before surgery.  Take ONLY these medicines the morning of surgery with A SIP OF WATER: Gabapentin, amitriptyline (Elavil), amlodipine, Levothyroxine (Synthroid)   If You have been diagnosed with Sleep Apnea - Bring CPAP mask and tubing day of surgery. We will provide you with a CPAP machine on the day of your surgery.                   You may not have any metal on your body including hair pins, jewelry, and body piercing  Do not wear make-up, lotions, powders, perfumes or deodorant  Do not wear nail polish including gel and S&S, artificial / acrylic nails, or any other type of covering on natural nails including finger and toenails. If you have artificial nails, gel coating, etc., that needs to be removed by a nail salon, Please have this removed prior to surgery. Not doing so may mean that your surgery could be cancelled or delayed if the Surgeon or anesthesia staff feels like they are unable to  monitor you safely.   Do not shave 48 hours prior to surgery to avoid nicks in your skin which may contribute to postoperative infections.   Contacts, Hearing Aids, dentures or bridgework may not be worn into surgery. DENTURES WILL BE REMOVED PRIOR TO SURGERY PLEASE DO NOT APPLY "Poly grip" OR ADHESIVES!!!  Patients discharged on the day of surgery will not be allowed to drive home.  Someone NEEDS to stay with you for the first 24 hours after anesthesia.  Do not bring your home medications to the hospital. The Pharmacy will dispense medications listed on your medication list to you during your admission in the Hospital.  Special Instructions: Bring a copy of your healthcare power of attorney and living will documents the day of surgery, if you wish to have them scanned into your [facility] Records- EPIC  Please read over the following fact sheets you were given: IF YOU HAVE QUESTIONS ABOUT YOUR PRE-OP INSTRUCTIONS, PLEASE CALL [phone]  (KAY)   [place] - Preparing for Surgery Before surgery, you can play an important role.  Because skin is not sterile, your skin needs to be as free of germs as possible.  You can reduce the number of germs on your skin by washing with CHG (chlorahexidine gluconate) soap before surgery.  CHG is an antiseptic cleaner which kills germs and bonds with the skin to continue killing germs even after washing. Please DO NOT use if you have an allergy to CHG or antibacterial soaps.  If your skin becomes reddened/irritated stop using the CHG and inform your nurse when you arrive at Short Stay. Do not shave (including legs and underarms) for at least 48 hours prior to the first CHG shower.  You may shave your face/neck.  Please follow these instructions carefully:  1.  Shower with CHG Soap the night before surgery and the  morning of surgery.  2.  If you choose to wash your hair, wash your hair first as  usual with your normal  shampoo.  3.  After you  shampoo, rinse your hair and body thoroughly to remove the shampoo.                             4.  Use CHG as you would any other liquid soap.  You can apply chg directly to the skin and wash.  Gently with a scrungie or clean washcloth.  5.  Apply the CHG Soap to your body ONLY FROM THE NECK DOWN.   Do not use on face/ open                           Wound or open sores. Avoid contact with eyes, ears mouth and genitals (private parts).                       Wash face,  Genitals (private parts) with your normal soap.             6.  Wash thoroughly, paying special attention to the area where your  surgery  will be performed.  7.  Thoroughly rinse your body with warm water from the neck down.  8.  DO NOT shower/wash with your normal soap after using and rinsing off the CHG Soap.            9.  Pat yourself dry with a clean towel.            10.  Wear clean pajamas.            11.  Place clean sheets on your bed the night of your first shower and do not  sleep with pets.  ON THE DAY OF SURGERY : Do not apply any lotions/deodorants the morning of surgery.  Please wear clean clothes to the hospital/surgery center.    FAILURE TO FOLLOW THESE INSTRUCTIONS MAY RESULT IN THE CANCELLATION OF YOUR SURGERY  PATIENT SIGNATURE_________________________________  NURSE SIGNATURE__________________________________  ________________________________________________________________________        Rogelia Mire    An incentive spirometer is a tool that can help keep your lungs clear and active. This tool measures how well you are filling your lungs with each breath. Taking long deep breaths may help reverse or decrease the chance of developing breathing (pulmonary) problems (especially infection) following: A long period of time when you are unable to move or be active. BEFORE THE PROCEDURE  If the spirometer includes an indicator to show your best effort, your nurse or respiratory therapist  will set it to a desired goal. If possible, sit up straight or lean slightly forward. Try not to slouch. Hold the incentive spirometer in an upright position. INSTRUCTIONS FOR USE  Sit on the edge of your bed if possible, or sit up as far as you can in bed or on a chair. Hold the incentive spirometer in an upright position. Breathe out normally. Place the mouthpiece in your mouth and seal your lips tightly around it. Breathe in slowly and as deeply as possible, raising the piston or the ball toward the top of the column. Hold your breath for 3-5 seconds or for as long as possible. Allow the piston or ball to fall to the bottom of the column. Remove the mouthpiece from your mouth and breathe out normally. Rest for a few seconds and repeat Steps 1 through 7 at least 10 times every 1-2  hours when you are awake. Take your time and take a few normal breaths between deep breaths. The spirometer may include an indicator to show your best effort. Use the indicator as a goal to work toward during each repetition. After each set of 10 deep breaths, practice coughing to be sure your lungs are clear. If you have an incision (the cut made at the time of surgery), support your incision when coughing by placing a pillow or rolled up towels firmly against it. Once you are able to get out of bed, walk around indoors and cough well. You may stop using the incentive spirometer when instructed by your caregiver.  RISKS AND COMPLICATIONS Take your time so you do not get dizzy or light-headed. If you are in pain, you may need to take or ask for pain medication before doing incentive spirometry. It is harder to take a deep breath if you are having pain. AFTER USE Rest and breathe slowly and easily. It can be helpful to keep track of a log of your progress. Your caregiver can provide you with a simple table to help with this. If you are using the spirometer at home, follow these instructions: SEEK MEDICAL CARE IF:   You are having difficultly using the spirometer. You have trouble using the spirometer as often as instructed. Your pain medication is not giving enough relief while using the spirometer. You develop fever of 100.5 F (38.1 C) or higher.                                                                                                    SEEK IMMEDIATE MEDICAL CARE IF:  You cough up bloody sputum that had not been present before. You develop fever of 102 F (38.9 C) or greater. You develop worsening pain at or near the incision site. MAKE SURE YOU:  Understand these instructions. Will watch your condition. Will get help right away if you are not doing well or get worse. Document Released: 08/06/2006 Document Revised: 06/18/2011 Document Reviewed: 10/07/2006 [facility] Patient Information 2014 [place], [place].

## 2022-04-03 NOTE — Progress Notes (Addendum)
COVID Vaccine received:  []  No [x]  Yes Date of any COVID positive Test in last 90 days: None  PCP - Everlene Other, DO  Preop clearance   02-20-22 Epic note Cardiologist - none  Chest x-ray -  EKG -  02-20-2022 Stress Test - 2018 ECHO - 2018 Cardiac Cath - CT Chest - 06-20-2021  PCR screen: [x]  Ordered & Completed                      []   No Order but Needs PROFEND                      []   N/A for this surgery  Surgery Plan:  [x]  Ambulatory                            []  Outpatient in bed                            []  Admit  Anesthesia:    []  General  [x]  Spinal                           []   Choice []   MAC  Pacemaker / ICD device [x]  No []  Yes        Device order form faxed [x]  No    []   Yes      Faxed to:  Spinal Cord Stimulator:[x]  No []  Yes      (Remind patient to bring remote DOS) Other Implants:   History of Sleep Apnea? []  No [x]  Yes  Mild OSA, no CPAP CPAP used?- [x]  No []  Yes    PRE-DM  no medications just diet control Does the patient monitor blood sugar? []  No [x]  Yes  []  N/A Does patient have a [employer] or Dexacom? [x]  No []  Yes   Fasting Blood Sugar Ranges-  Checks Blood Sugar _0_ times a day  Blood Thinner / Instructions:none Aspirin Instructions:none  ERAS Protocol Ordered: []  No  [x]  Yes PRE-SURGERY []  ENSURE  [x]  G2  Patient is to be NPO after: 04:30 am  Comments: Patient has a history of MRSA  Activity level: Patient can climb a flight of stairs without difficulty; [x]  No CP  [x]  No SOB, but would have leg pain.  Patient can perform ADLs without assistance.   Anesthesia review: COPD, HTN, Pre-DM, mild OSA    Patient denies shortness of breath, fever, cough and chest pain at PAT appointment.  Patient verbalized understanding and agreement to the Pre-Surgical Instructions that were given to them at this PAT appointment. Patient was also educated of the need to review these PAT instructions again prior to his/her surgery.I reviewed the  appropriate phone numbers to call if they have any and questions or concerns.

## 2022-04-05 ENCOUNTER — Other Ambulatory Visit: Payer: Self-pay

## 2022-04-05 ENCOUNTER — Encounter (HOSPITAL_COMMUNITY): Payer: Self-pay

## 2022-04-05 ENCOUNTER — Encounter (HOSPITAL_COMMUNITY)
Admission: RE | Admit: 2022-04-05 | Discharge: 2022-04-05 | Disposition: A | Discharge status: Home / Self Care | Payer: Medicare HMO | Source: Ambulatory Visit | Attending: Orthopedic Surgery | Admitting: Orthopedic Surgery

## 2022-04-05 VITALS — BP 125/60 | HR 68 | Temp 97.7°F | Resp 16 | Ht 60.0 in | Wt 139.0 lb

## 2022-04-05 DIAGNOSIS — Z01812 Encounter for preprocedural laboratory examination: Secondary | ICD-10-CM | POA: Diagnosis not present

## 2022-04-05 DIAGNOSIS — R7303 Prediabetes: Secondary | ICD-10-CM | POA: Diagnosis not present

## 2022-04-05 DIAGNOSIS — I10 Essential (primary) hypertension: Secondary | ICD-10-CM | POA: Insufficient documentation

## 2022-04-05 DIAGNOSIS — Z01818 Encounter for other preprocedural examination: Secondary | ICD-10-CM

## 2022-04-05 HISTORY — DX: Prediabetes: R73.03

## 2022-04-05 LAB — CBC
HCT: 41.6 % (ref 36.0–46.0)
Hemoglobin: 12.8 g/dL (ref 12.0–15.0)
MCH: 26.3 pg (ref 26.0–34.0)
MCHC: 30.8 g/dL (ref 30.0–36.0)
MCV: 85.4 fL (ref 80.0–100.0)
Platelets: 305 10*3/uL (ref 150–400)
RBC: 4.87 MIL/uL (ref 3.87–5.11)
RDW: 14.8 % (ref 11.5–15.5)
WBC: 8 10*3/uL (ref 4.0–10.5)
nRBC: 0 % (ref 0.0–0.2)

## 2022-04-05 LAB — GLUCOSE, CAPILLARY: Glucose-Capillary: 96 mg/dL (ref 70–99)

## 2022-04-05 LAB — BASIC METABOLIC PANEL
Anion gap: 8 (ref 5–15)
BUN: 16 mg/dL (ref 8–23)
CO2: 25 mmol/L (ref 22–32)
Calcium: 10 mg/dL (ref 8.9–10.3)
Chloride: 106 mmol/L (ref 98–111)
Creatinine, Ser: 0.87 mg/dL (ref 0.44–1.00)
GFR, Estimated: >60 mL/min (ref >60)
Glucose, Bld: 95 mg/dL (ref 70–99)
Potassium: 4.5 mmol/L (ref 3.5–5.1)
Sodium: 139 mmol/L (ref 135–145)

## 2022-04-05 LAB — SURGICAL PCR SCREEN
MRSA, PCR: NEGATIVE
Staphylococcus aureus: NEGATIVE

## 2022-04-06 LAB — HEMOGLOBIN A1C
Hgb A1c MFr Bld: 5.7 % — ABNORMAL HIGH (ref 4.8–5.6)
Mean Plasma Glucose: 117 mg/dL

## 2022-04-12 ENCOUNTER — Ambulatory Visit
Admission: EM | Admit: 2022-04-12 | Discharge: 2022-04-12 | Disposition: A | Discharge status: Home / Self Care | Payer: Medicare HMO | Attending: Internal Medicine | Admitting: Internal Medicine

## 2022-04-12 ENCOUNTER — Encounter: Payer: Self-pay | Admitting: Emergency Medicine

## 2022-04-12 ENCOUNTER — Other Ambulatory Visit: Payer: Self-pay

## 2022-04-12 DIAGNOSIS — N3001 Acute cystitis with hematuria: Secondary | ICD-10-CM | POA: Diagnosis not present

## 2022-04-12 DIAGNOSIS — J441 Chronic obstructive pulmonary disease with (acute) exacerbation: Secondary | ICD-10-CM | POA: Diagnosis not present

## 2022-04-12 LAB — POCT URINALYSIS DIP (MANUAL ENTRY)
Bilirubin, UA: NEGATIVE
Glucose, UA: NEGATIVE mg/dL
Nitrite, UA: POSITIVE — AB
Spec Grav, UA: >=1.03 — AB (ref 1.010–1.025)
Urobilinogen, UA: 1 E.U./dL
pH, UA: 5.5 (ref 5.0–8.0)

## 2022-04-12 MED ORDER — BENZONATATE 100 MG PO CAPS: 100.0000 mg | ORAL_CAPSULE | Freq: Three times a day (TID) | ORAL | 0 refills | Status: DC | PRN

## 2022-04-12 MED ORDER — SULFAMETHOXAZOLE-TRIMETHOPRIM 800-160 MG PO TABS: 1.0000 | ORAL_TABLET | Freq: Two times a day (BID) | ORAL | 0 refills | Status: AC

## 2022-04-12 NOTE — ED Triage Notes (Signed)
Pt reports cough x4-5 days. Denies any known fevers.   Pt also reports is scheduled to have knee replacement on 1/8 and reports needs to have possible UTI evaluated. Pt reports urinary frequency with decreased output since this morning.

## 2022-04-12 NOTE — Discharge Instructions (Addendum)
I am concerned you are having an exacerbation of COPD.  Please take the Bactrim to treat this.  Also start Mucinex or guaifenesin 600 mg twice daily to help pick up congestion.  You can use the [employer] as needed for a dry cough.  Seek care if you develop chest pain or shortness of breath.  You also have a UTI today.  The Bactrim will take care of this as well.  If you develop severe pain, nausea/vomiting and unable to keep fluids down, go to the ER.

## 2022-04-12 NOTE — ED Provider Notes (Signed)
[facility]    CSN: [id number] Arrival date & time: 04/12/22  1309      History   Chief Complaint Chief Complaint  Patient presents with   Cough    HPI [name] [name] is a 77 y.o. female.   Patient presents today for 4 to 5 days of congested cough, coughing up discolored mucus, chest and nasal congestion, right ear pain when blowing her nose.  No fevers, shortness of breath, chest pain or tightness, sneezing, sore throat, headache, abdominal pain, nausea/vomiting, diarrhea, or decreased appetite.  Energy levels are stable.  No known sick contacts.  She reports a history of COPD for which she uses as needed albuterol inhaler which helps when she takes it.  She does not have a chronic cough at baseline.  Patient also concerned about urinary symptoms that began this morning.  She endorses dysuria, urinary frequency and urgency, voiding smaller amounts, strong urinary odor.  No new urinary incontinence, hematuria, abdominal pain or new back pain, fever, nausea/vomiting, or vaginal discharge.  She is not currently sexually active.  Has not take anything for symptoms so far.  Reports she is worried because she is supposed to have knee surgery next week.     Past Medical History:  Diagnosis Date   COPD (chronic obstructive pulmonary disease) ([place])    Depression    Dyspnea    Essential hypertension, benign 01/21/2019   History of pneumonia    2010 on ventalator   History of recurrent UTIs    Hyperlipidemia    Hypothyroidism    Insomnia 12/18/2018   Left knee DJD    Osteoporosis    Pneumonia    Pre-diabetes    PTSD (post-traumatic stress disorder)    Restless leg syndrome    Sleep apnea     Patient Active Problem List   Diagnosis Date Noted   Hyperlipidemia 02/20/2022   Chronic headaches 02/20/2022   Pre-operative clearance 02/20/2022   Prediabetes 11/20/2021   S/P reverse total shoulder arthroplasty, 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      Past Surgical History:  Procedure Laterality Date   ABDOMINAL HYSTERECTOMY     still had both ovaries   COLONOSCOPY  12/13/2004   Dr. Anthony Sar; Normal exam   KNEE ARTHROSCOPY WITH MEDIAL MENISECTOMY Left 08/24/2013   Procedure: LEFT KNEE ARTHROSCOPY WITH MEDIAL MENISCECTOMY  ;  Surgeon: Lorn Junes, MD;  Location: [facility];  Service: Orthopedics;  Laterality: Left;   PARTIAL KNEE ARTHROPLASTY Left 08/24/2013   Procedure: LEFT KNEE ARTHROPLASTY UNI COMPARTMENTAL ;  Surgeon: Lorn Junes, MD;  Location: [facility];  Service: Orthopedics;  Laterality: Left;  Left knee arthroplasty uni compartment  With left knee scope medial meniscectomy     REVERSE SHOULDER ARTHROPLASTY Left 02/14/2021   Procedure: LEFT REVERSE SHOULDER ARTHROPLASTY;  Surgeon: Meredith Pel, MD;  Location: [facility];  Service: Orthopedics;  Laterality: Left;    OB History   No obstetric history on file.      Home Medications    Prior to Admission medications   Medication Sig Start Date End Date Taking? Authorizing Provider  benzonatate (TESSALON) 100 MG capsule Take 1  capsule (100 mg total) by mouth 3 (three) times daily as needed for cough. Do not take with alcohol or while driving or operating heavy machinery.  May cause drowsiness. 04/12/22  Yes Eulogio Bear, NP  sulfamethoxazole-trimethoprim (BACTRIM DS) 800-160 MG tablet Take 1 tablet by mouth 2 (two) times daily for 5 days. 04/12/22 04/17/22 Yes Eulogio Bear, NP  ALPRAZolam Duanne Moron) 0.5 MG tablet TAKE 1 TABLET AT BEDTIME AS NEEDED FOR ANXIETY Patient taking differently: Take 0.5 mg by mouth at bedtime. 03/22/22   Coral Spikes, DO  amitriptyline  (ELAVIL) 75 MG tablet Take 1 tablet (75 mg total) by mouth daily. Patient taking differently: Take 75 mg by mouth at bedtime. 02/20/22   Coral Spikes, DO  amLODipine (NORVASC) 5 MG tablet TAKE 1 TABLET (5 MG TOTAL) BY MOUTH DAILY. Patient taking differently: Take 5 mg by mouth at bedtime. 02/08/22   Coral Spikes, DO  aspirin EC 81 MG tablet Take 81 mg by mouth at bedtime. Swallow whole.    [provider]  gabapentin (NEURONTIN) 300 MG capsule TAKE 2 CAPSULES (600 MG TOTAL) BY MOUTH AT BEDTIME. 02/08/22   Coral Spikes, DO  levothyroxine (SYNTHROID) 88 MCG tablet TAKE 1 TABLET (88 MCG TOTAL) BY MOUTH DAILY. Patient taking differently: Take 88 mcg by mouth at bedtime. 01/24/22   Coral Spikes, DO  naproxen sodium (ALEVE) 220 MG tablet Take 440 mg by mouth daily as needed (pain.).    [provider]    Family History Family History  Problem Relation Age of Onset   Stroke Mother    Heart disease Father    Heart attack Father    Hypertension Father    Heart attack Brother    Emphysema Daughter    Emphysema Son    Hypertension Son    Colon cancer Neg Hx    Colon polyps Neg Hx     Social History Social History   Tobacco Use   Smoking status: Former    Packs/day: 1.50    Years: 35.00    Total pack years: 52.50    Types: Cigarettes    Quit date: 04/10/2003    Years since quitting: 19.0   Smokeless tobacco: Never  Vaping Use   Vaping Use: Never used  Substance Use Topics   Alcohol use: No   Drug use: No     Allergies   Codeine   Review of Systems Review of Systems Per HPI  Physical Exam Triage Vital Signs ED Triage Vitals  Enc Vitals Group     BP 04/12/22 1351 128/73     Pulse Rate 04/12/22 1351 82     Resp 04/12/22 1351 20     Temp 04/12/22 1351 97.8 F (36.6 C)     Temp Source 04/12/22 1351 Oral     SpO2 04/12/22 1351 95 %     Weight --      Height --      Head Circumference --      Peak Flow --      Pain Score 04/12/22 1350 8     Pain  Loc --      Pain Edu? --      Excl. in [place]? --    No data found.  Updated Vital Signs BP 128/73 (BP Location: Right Arm)   Pulse 82   Temp 97.8 F (36.6 C) (Oral)   Resp 20   SpO2 95%   Visual Acuity Right Eye Distance:  Left Eye Distance:   Bilateral Distance:    Right Eye Near:   Left Eye Near:    Bilateral Near:     Physical Exam Vitals and nursing note reviewed.  Constitutional:      General: She is not in acute distress.    Appearance: Normal appearance. She is not ill-appearing or toxic-appearing.  HENT:     Head: Normocephalic and atraumatic.     Right Ear: Tympanic membrane, ear canal and external ear normal.     Left Ear: Tympanic membrane, ear canal and external ear normal.     Nose: No congestion or rhinorrhea.     Mouth/Throat:     Mouth: Mucous membranes are moist.     Pharynx: Oropharynx is clear. Posterior oropharyngeal erythema present. No oropharyngeal exudate.  Eyes:     General: No scleral icterus.    Extraocular Movements: Extraocular movements intact.  Cardiovascular:     Rate and Rhythm: Normal rate and regular rhythm.  Pulmonary:     Effort: Pulmonary effort is normal. No respiratory distress.     Breath sounds: Normal breath sounds. No wheezing, rhonchi or rales.  Abdominal:     General: Abdomen is flat. Bowel sounds are normal. There is no distension.     Palpations: Abdomen is soft.     Tenderness: There is no abdominal tenderness. There is no right CVA tenderness or left CVA tenderness.  Musculoskeletal:     Cervical back: Normal range of motion and neck supple.  Lymphadenopathy:     Cervical: No cervical adenopathy.  Skin:    General: Skin is warm and dry.     Coloration: Skin is not jaundiced or pale.     Findings: No erythema or rash.  Neurological:     Mental Status: She is alert and oriented to person, place, and time.     Motor: No weakness.  Psychiatric:        Behavior: Behavior is cooperative.      UC Treatments /  Results  Labs (all labs ordered are listed, but only abnormal results are displayed) Labs Reviewed  POCT URINALYSIS DIP (MANUAL ENTRY) - Abnormal; Notable for the following components:      Result Value   Ketones, POC UA trace (5) (*)    Spec Grav, UA >=1.030 (*)    Blood, UA large (*)    Protein Ur, POC trace (*)    Nitrite, UA Positive (*)    Leukocytes, UA Large (3+) (*)    All other components within normal limits  URINE CULTURE    EKG   Radiology No results found.  Procedures Procedures (including critical care time)  Medications Ordered in UC Medications - No data to display  Initial Impression / Assessment and Plan / UC Course  I have reviewed the triage vital signs and the nursing notes.  Pertinent labs & imaging results that were available during my care of the patient were reviewed by me and considered in my medical decision making (see chart for details).   Patient is well-appearing, normotensive, afebrile, not tachycardic, not tachypneic, oxygenating well on room air.    COPD exacerbation (HCC) Start Bactrim DS for 5 days Also recommended starting Mucinex or expectorant, pushing hydration Can start Tessalon Perles as needed for dry cough ER precautions discussed with patient  Acute cystitis with hematuria Urinalysis today shows elevated specific gravity, large amount of blood, positive nitrites, 3+ leukocyte Estrace Urine culture pending Treat with Bactrim DS twice daily for 5  days while urine culture is pending Strict ER precautions discussed with patient  The patient was given the opportunity to ask questions.  All questions answered to their satisfaction.  The patient is in agreement to this plan.    Final Clinical Impressions(s) / UC Diagnoses   Final diagnoses:  COPD exacerbation ([place])  Acute cystitis with hematuria     Discharge Instructions      I am concerned you are having an exacerbation of COPD.  Please take the Bactrim to treat this.   Also start Mucinex or guaifenesin 600 mg twice daily to help pick up congestion.  You can use the [employer] as needed for a dry cough.  Seek care if you develop chest pain or shortness of breath.  You also have a UTI today.  The Bactrim will take care of this as well.  If you develop severe pain, nausea/vomiting and unable to keep fluids down, go to the ER.     ED Prescriptions     Medication Sig Dispense Auth. Provider   sulfamethoxazole-trimethoprim (BACTRIM DS) 800-160 MG tablet Take 1 tablet by mouth 2 (two) times daily for 5 days. 10 tablet Noemi Chapel A, NP   benzonatate (TESSALON) 100 MG capsule Take 1 capsule (100 mg total) by mouth 3 (three) times daily as needed for cough. Do not take with alcohol or while driving or operating heavy machinery.  May cause drowsiness. 21 capsule Eulogio Bear, NP      PDMP not reviewed this encounter.   Eulogio Bear, NP 04/12/22 [phone]

## 2022-04-14 LAB — URINE CULTURE: Culture: >=100000 — AB

## 2022-04-14 NOTE — Anesthesia Preprocedure Evaluation (Addendum)
Anesthesia Evaluation  Patient identified by MRN, date of birth, ID band Patient awake    Reviewed: Allergy & Precautions, NPO status , Patient's Chart, lab work & pertinent test results  History of Anesthesia Complications Negative for: history of anesthetic complications  Airway Mallampati: II  TM Distance: >3 FB Neck ROM: Full    Dental  (+) Partial Upper, Missing, Dental Advisory Given   Pulmonary sleep apnea , COPD,  COPD inhaler, former smoker   Pulmonary exam normal        Cardiovascular hypertension, Pt. on medications Normal cardiovascular exam     Neuro/Psych  PSYCHIATRIC DISORDERS Anxiety Depression    negative neurological ROS     GI/Hepatic negative GI ROS, Neg liver ROS,,,  Endo/Other  Hypothyroidism    Renal/GU negative Renal ROS     Musculoskeletal  (+) Arthritis ,    Abdominal   Peds  Hematology negative hematology ROS (+)   Anesthesia Other Findings - PTSD  Reproductive/Obstetrics                             Anesthesia Physical Anesthesia Plan  ASA: 3  Anesthesia Plan: Spinal and MAC   Post-op Pain Management: GA combined w/ Regional for post-op pain and Tylenol PO (pre-op)*, Toradol IV (intra-op)* and Regional block*   Induction: Intravenous  PONV Risk Score and Plan: 4 or greater and Ondansetron, Dexamethasone, Midazolam and Treatment may vary due to age or medical condition  Airway Management Planned: Natural Airway  Additional Equipment: None  Intra-op Plan:   Post-operative Plan:   Informed Consent: I have reviewed the patients History and Physical, chart, labs and discussed the procedure including the risks, benefits and alternatives for the proposed anesthesia with the patient or authorized representative who has indicated his/her understanding and acceptance.     Dental advisory given  Plan Discussed with: Anesthesiologist and CRNA  Anesthesia  Plan Comments:         Anesthesia Quick Evaluation

## 2022-04-15 ENCOUNTER — Telehealth (HOSPITAL_COMMUNITY): Payer: Self-pay | Admitting: Orthopedic Surgery

## 2022-04-15 MED ORDER — ONDANSETRON HCL 4 MG PO TABS: 4.0000 mg | ORAL_TABLET | Freq: Three times a day (TID) | ORAL | 0 refills | Status: AC | PRN

## 2022-04-15 MED ORDER — METHOCARBAMOL 500 MG PO TABS: 500.0000 mg | ORAL_TABLET | Freq: Three times a day (TID) | ORAL | 0 refills | Status: AC | PRN

## 2022-04-15 MED ORDER — OXYCODONE HCL 5 MG PO TABS: 5.0000 mg | ORAL_TABLET | ORAL | 0 refills | Status: AC | PRN

## 2022-04-15 MED ORDER — CELECOXIB 100 MG PO CAPS: 100.0000 mg | ORAL_CAPSULE | Freq: Two times a day (BID) | ORAL | 0 refills | Status: AC

## 2022-04-15 NOTE — Telephone Encounter (Signed)
Patient having surgery tomorrow. Post op meds sent to pharmacy this evening per patient request so she can pick them up and have available for after surgery.

## 2022-04-16 ENCOUNTER — Ambulatory Visit (HOSPITAL_COMMUNITY)
Admission: RE | Admit: 2022-04-16 | Discharge: 2022-04-16 | Disposition: A | Discharge status: Home / Self Care | Payer: Medicare HMO | Source: Ambulatory Visit | Attending: Orthopedic Surgery | Admitting: Orthopedic Surgery

## 2022-04-16 ENCOUNTER — Encounter (HOSPITAL_COMMUNITY)
Admission: RE | Disposition: A | Discharge status: Home / Self Care | Payer: Self-pay | Source: Ambulatory Visit | Attending: Orthopedic Surgery

## 2022-04-16 ENCOUNTER — Other Ambulatory Visit: Payer: Self-pay

## 2022-04-16 ENCOUNTER — Encounter (HOSPITAL_COMMUNITY): Payer: Self-pay | Admitting: Orthopedic Surgery

## 2022-04-16 ENCOUNTER — Ambulatory Visit (HOSPITAL_COMMUNITY): Payer: Medicare HMO | Admitting: Anesthesiology

## 2022-04-16 ENCOUNTER — Ambulatory Visit (HOSPITAL_COMMUNITY): Payer: Medicare HMO

## 2022-04-16 ENCOUNTER — Ambulatory Visit (HOSPITAL_BASED_OUTPATIENT_CLINIC_OR_DEPARTMENT_OTHER): Payer: Medicare HMO | Admitting: Anesthesiology

## 2022-04-16 DIAGNOSIS — M1711 Unilateral primary osteoarthritis, right knee: Secondary | ICD-10-CM | POA: Diagnosis not present

## 2022-04-16 DIAGNOSIS — Z471 Aftercare following joint replacement surgery: Secondary | ICD-10-CM | POA: Diagnosis not present

## 2022-04-16 DIAGNOSIS — E039 Hypothyroidism, unspecified: Secondary | ICD-10-CM | POA: Diagnosis not present

## 2022-04-16 DIAGNOSIS — G4730 Sleep apnea, unspecified: Secondary | ICD-10-CM | POA: Insufficient documentation

## 2022-04-16 DIAGNOSIS — F418 Other specified anxiety disorders: Secondary | ICD-10-CM

## 2022-04-16 DIAGNOSIS — Z87891 Personal history of nicotine dependence: Secondary | ICD-10-CM

## 2022-04-16 DIAGNOSIS — G8918 Other acute postprocedural pain: Secondary | ICD-10-CM | POA: Diagnosis not present

## 2022-04-16 DIAGNOSIS — R7303 Prediabetes: Secondary | ICD-10-CM

## 2022-04-16 DIAGNOSIS — J449 Chronic obstructive pulmonary disease, unspecified: Secondary | ICD-10-CM | POA: Insufficient documentation

## 2022-04-16 DIAGNOSIS — I10 Essential (primary) hypertension: Secondary | ICD-10-CM

## 2022-04-16 DIAGNOSIS — M25761 Osteophyte, right knee: Secondary | ICD-10-CM | POA: Diagnosis not present

## 2022-04-16 DIAGNOSIS — F32A Depression, unspecified: Secondary | ICD-10-CM | POA: Diagnosis not present

## 2022-04-16 DIAGNOSIS — M25461 Effusion, right knee: Secondary | ICD-10-CM | POA: Diagnosis not present

## 2022-04-16 DIAGNOSIS — F419 Anxiety disorder, unspecified: Secondary | ICD-10-CM | POA: Diagnosis not present

## 2022-04-16 DIAGNOSIS — Z96651 Presence of right artificial knee joint: Secondary | ICD-10-CM | POA: Diagnosis not present

## 2022-04-16 HISTORY — PX: PARTIAL KNEE ARTHROPLASTY: SHX2174

## 2022-04-16 SURGERY — ARTHROPLASTY, KNEE, UNICOMPARTMENTAL
Anesthesia: Monitor Anesthesia Care | Site: Knee | Laterality: Right

## 2022-04-16 MED ORDER — ACETAMINOPHEN 500 MG PO TABS
1000.0000 mg | ORAL_TABLET | Freq: Once | ORAL | Status: AC
  Administered 2022-04-16: 1000 mg via ORAL
  Filled 2022-04-16: qty 2

## 2022-04-16 MED ORDER — BUPIVACAINE IN DEXTROSE 0.75-8.25 % IT SOLN
INTRATHECAL | Status: DC | PRN
  Administered 2022-04-16: 1.6 mL via INTRATHECAL

## 2022-04-16 MED ORDER — ACETAMINOPHEN 500 MG PO TABS: 1000.0000 mg | ORAL_TABLET | Freq: Once | ORAL | Status: DC

## 2022-04-16 MED ORDER — ACETAMINOPHEN 500 MG PO TABS: 1000.0000 mg | ORAL_TABLET | Freq: Four times a day (QID) | ORAL | Status: DC

## 2022-04-16 MED ORDER — VANCOMYCIN HCL IN DEXTROSE 1-5 GM/200ML-% IV SOLN
1000.0000 mg | Freq: Once | INTRAVENOUS | Status: AC
  Administered 2022-04-16: 1000 mg via INTRAVENOUS
  Filled 2022-04-16: qty 200

## 2022-04-16 MED ORDER — ORAL CARE MOUTH RINSE: 15.0000 mL | Freq: Once | OROMUCOSAL | Status: AC

## 2022-04-16 MED ORDER — PROPOFOL 10 MG/ML IV BOLUS
INTRAVENOUS | Status: DC | PRN
  Administered 2022-04-16 (×2): 20 mg via INTRAVENOUS

## 2022-04-16 MED ORDER — POVIDONE-IODINE 10 % EX SWAB: 2.0000 | Freq: Once | CUTANEOUS | Status: DC

## 2022-04-16 MED ORDER — SODIUM CHLORIDE (PF) 0.9 % IJ SOLN
INTRAMUSCULAR | Status: AC
  Filled 2022-04-16: qty 30

## 2022-04-16 MED ORDER — CELECOXIB 200 MG PO CAPS
400.0000 mg | ORAL_CAPSULE | Freq: Once | ORAL | Status: AC
  Administered 2022-04-16: 400 mg via ORAL
  Filled 2022-04-16: qty 2

## 2022-04-16 MED ORDER — SODIUM CHLORIDE 0.9 % IR SOLN
Status: DC | PRN
  Administered 2022-04-16: 3000 mL

## 2022-04-16 MED ORDER — FENTANYL CITRATE (PF) 100 MCG/2ML IJ SOLN
INTRAMUSCULAR | Status: DC | PRN
  Administered 2022-04-16: 50 ug via INTRAVENOUS

## 2022-04-16 MED ORDER — LACTATED RINGERS IV BOLUS: 250.0000 mL | Freq: Once | INTRAVENOUS | Status: DC

## 2022-04-16 MED ORDER — BUPIVACAINE LIPOSOME 1.3 % IJ SUSP: 20.0000 mL | Freq: Once | INTRAMUSCULAR | Status: DC

## 2022-04-16 MED ORDER — SODIUM CHLORIDE (PF) 0.9 % IJ SOLN
INTRAMUSCULAR | Status: DC | PRN
  Administered 2022-04-16: 30 mL

## 2022-04-16 MED ORDER — PROMETHAZINE HCL 25 MG/ML IJ SOLN: 6.2500 mg | INTRAMUSCULAR | Status: DC | PRN

## 2022-04-16 MED ORDER — KETOROLAC TROMETHAMINE 15 MG/ML IJ SOLN: 7.5000 mg | Freq: Four times a day (QID) | INTRAMUSCULAR | Status: DC

## 2022-04-16 MED ORDER — 0.9 % SODIUM CHLORIDE (POUR BTL) OPTIME
TOPICAL | Status: DC | PRN
  Administered 2022-04-16: 1000 mL

## 2022-04-16 MED ORDER — CEFAZOLIN SODIUM-DEXTROSE 2-4 GM/100ML-% IV SOLN: 2.0000 g | Freq: Four times a day (QID) | INTRAVENOUS | Status: DC

## 2022-04-16 MED ORDER — LACTATED RINGERS IV SOLN: INTRAVENOUS | Status: DC

## 2022-04-16 MED ORDER — ISOPROPYL ALCOHOL 70 % SOLN
Status: DC | PRN
  Administered 2022-04-16: 1 via TOPICAL

## 2022-04-16 MED ORDER — LACTATED RINGERS IV BOLUS
250.0000 mL | Freq: Once | INTRAVENOUS | Status: AC
  Administered 2022-04-16: 250 mL via INTRAVENOUS

## 2022-04-16 MED ORDER — FENTANYL CITRATE PF 50 MCG/ML IJ SOSY: 25.0000 ug | PREFILLED_SYRINGE | INTRAMUSCULAR | Status: DC | PRN

## 2022-04-16 MED ORDER — MIDAZOLAM HCL 2 MG/2ML IJ SOLN
INTRAMUSCULAR | Status: AC
  Filled 2022-04-16: qty 2

## 2022-04-16 MED ORDER — ACETAMINOPHEN 325 MG PO TABS: 325.0000 mg | ORAL_TABLET | Freq: Four times a day (QID) | ORAL | Status: DC | PRN

## 2022-04-16 MED ORDER — PROPOFOL 500 MG/50ML IV EMUL
INTRAVENOUS | Status: DC | PRN
  Administered 2022-04-16: 100 ug/kg/min via INTRAVENOUS

## 2022-04-16 MED ORDER — CHLORHEXIDINE GLUCONATE 0.12 % MT SOLN
15.0000 mL | Freq: Once | OROMUCOSAL | Status: AC
  Administered 2022-04-16: 15 mL via OROMUCOSAL

## 2022-04-16 MED ORDER — FENTANYL CITRATE (PF) 100 MCG/2ML IJ SOLN
INTRAMUSCULAR | Status: AC
  Filled 2022-04-16: qty 2

## 2022-04-16 MED ORDER — BUPIVACAINE LIPOSOME 1.3 % IJ SUSP
INTRAMUSCULAR | Status: AC
  Filled 2022-04-16: qty 20

## 2022-04-16 MED ORDER — STERILE WATER FOR IRRIGATION IR SOLN
Status: DC | PRN
  Administered 2022-04-16: 2000 mL

## 2022-04-16 MED ORDER — ONDANSETRON HCL 4 MG PO TABS: 4.0000 mg | ORAL_TABLET | Freq: Four times a day (QID) | ORAL | Status: DC | PRN

## 2022-04-16 MED ORDER — ONDANSETRON HCL 4 MG/2ML IJ SOLN
INTRAMUSCULAR | Status: DC | PRN
  Administered 2022-04-16: 4 mg via INTRAVENOUS

## 2022-04-16 MED ORDER — TRANEXAMIC ACID-NACL 1000-0.7 MG/100ML-% IV SOLN
1000.0000 mg | INTRAVENOUS | Status: DC
  Filled 2022-04-16: qty 100

## 2022-04-16 MED ORDER — ASPIRIN 81 MG PO TBEC: 81.0000 mg | DELAYED_RELEASE_TABLET | Freq: Two times a day (BID) | ORAL | 0 refills | Status: AC

## 2022-04-16 MED ORDER — ONDANSETRON HCL 4 MG/2ML IJ SOLN: 4.0000 mg | Freq: Four times a day (QID) | INTRAMUSCULAR | Status: DC | PRN

## 2022-04-16 MED ORDER — OXYCODONE HCL 5 MG PO TABS: 5.0000 mg | ORAL_TABLET | ORAL | Status: DC | PRN

## 2022-04-16 MED ORDER — AMISULPRIDE (ANTIEMETIC) 5 MG/2ML IV SOLN: 10.0000 mg | Freq: Once | INTRAVENOUS | Status: DC | PRN

## 2022-04-16 MED ORDER — DEXAMETHASONE SODIUM PHOSPHATE 10 MG/ML IJ SOLN
INTRAMUSCULAR | Status: DC | PRN
  Administered 2022-04-16: 5 mg

## 2022-04-16 MED ORDER — DEXAMETHASONE SODIUM PHOSPHATE 10 MG/ML IJ SOLN
INTRAMUSCULAR | Status: AC
  Filled 2022-04-16: qty 1

## 2022-04-16 MED ORDER — DEXAMETHASONE SODIUM PHOSPHATE 10 MG/ML IJ SOLN
8.0000 mg | Freq: Once | INTRAMUSCULAR | Status: AC
  Administered 2022-04-16: 8 mg via INTRAVENOUS

## 2022-04-16 MED ORDER — ROPIVACAINE HCL 7.5 MG/ML IJ SOLN
INTRAMUSCULAR | Status: DC | PRN
  Administered 2022-04-16: 20 mL via PERINEURAL

## 2022-04-16 MED ORDER — LACTATED RINGERS IV BOLUS
500.0000 mL | Freq: Once | INTRAVENOUS | Status: AC
  Administered 2022-04-16: 500 mL via INTRAVENOUS

## 2022-04-16 MED ORDER — METHOCARBAMOL 500 MG IVPB - SIMPLE MED: 500.0000 mg | Freq: Four times a day (QID) | INTRAVENOUS | Status: DC | PRN

## 2022-04-16 MED ORDER — CEFAZOLIN SODIUM-DEXTROSE 2-4 GM/100ML-% IV SOLN
2.0000 g | INTRAVENOUS | Status: AC
  Administered 2022-04-16: 2 g via INTRAVENOUS
  Filled 2022-04-16: qty 100

## 2022-04-16 MED ORDER — MIDAZOLAM HCL 5 MG/5ML IJ SOLN
INTRAMUSCULAR | Status: DC | PRN
  Administered 2022-04-16 (×2): 1 mg via INTRAVENOUS

## 2022-04-16 MED ORDER — ONDANSETRON HCL 4 MG/2ML IJ SOLN
INTRAMUSCULAR | Status: AC
  Filled 2022-04-16: qty 2

## 2022-04-16 MED ORDER — BUPIVACAINE LIPOSOME 1.3 % IJ SUSP
INTRAMUSCULAR | Status: DC | PRN
  Administered 2022-04-16: 20 mL

## 2022-04-16 MED ORDER — PROPOFOL 1000 MG/100ML IV EMUL
INTRAVENOUS | Status: AC
  Filled 2022-04-16: qty 100

## 2022-04-16 MED ORDER — HYDROMORPHONE HCL 1 MG/ML IJ SOLN: 0.5000 mg | INTRAMUSCULAR | Status: DC | PRN

## 2022-04-16 MED ORDER — METHOCARBAMOL 500 MG PO TABS: 500.0000 mg | ORAL_TABLET | Freq: Four times a day (QID) | ORAL | Status: DC | PRN

## 2022-04-16 SURGICAL SUPPLY — 65 items
ADH SKN CLS APL DERMABOND .7 (GAUZE/BANDAGES/DRESSINGS) ×1
APL PRP STRL LF DISP 70% ISPRP (MISCELLANEOUS) ×1
BAG COUNTER SPONGE SURGICOUNT (BAG) ×2 IMPLANT
BAG SPNG CNTER NS LX DISP (BAG) ×1
BLADE SAW RECIPROCATING 77.5 (BLADE) ×2 IMPLANT
BLADE SAW SGTL 13.0X1.19X90.0M (BLADE) ×2 IMPLANT
BNDG CMPR 5X3 CHSV STRCH STRL (GAUZE/BANDAGES/DRESSINGS) ×1
BNDG CMPR MED 10X6 ELC LF (GAUZE/BANDAGES/DRESSINGS) ×1
BNDG COHESIVE 3X5 TAN ST LF (GAUZE/BANDAGES/DRESSINGS) ×2 IMPLANT
BNDG ELASTIC 6X10 VLCR STRL LF (GAUZE/BANDAGES/DRESSINGS) ×2 IMPLANT
BOWL SMART MIX CTS (DISPOSABLE) ×2 IMPLANT
BRNG TIB D 8 STRL KN RT MDL (Orthopedic Implant) ×1 IMPLANT
BSPLAT TIB D STRL KN RT MED TI (Orthopedic Implant) ×1 IMPLANT
CEMENT BONE R 1X40 (Cement) ×2 IMPLANT
CHLORAPREP W/TINT 26 (MISCELLANEOUS) ×2 IMPLANT
CLSR STERI-STRIP ANTIMIC 1/2X4 (GAUZE/BANDAGES/DRESSINGS) IMPLANT
COVER SURGICAL LIGHT HANDLE (MISCELLANEOUS) ×2 IMPLANT
CUFF TOURN SGL QUICK 34 (TOURNIQUET CUFF) ×1
CUFF TRNQT CYL 34X4.125X (TOURNIQUET CUFF) ×2 IMPLANT
DERMABOND ADVANCED .7 DNX12 (GAUZE/BANDAGES/DRESSINGS) ×2 IMPLANT
DRAPE INCISE IOBAN 85X60 (DRAPES) ×2 IMPLANT
DRAPE SHEET LG 3/4 BI-LAMINATE (DRAPES) ×2 IMPLANT
DRAPE U-SHAPE 47X51 STRL (DRAPES) ×2 IMPLANT
DRESSING AQUACEL AG SP 3.5X10 (GAUZE/BANDAGES/DRESSINGS) IMPLANT
DRSG AQUACEL AG ADV 3.5X 6 (GAUZE/BANDAGES/DRESSINGS) ×2 IMPLANT
DRSG AQUACEL AG SP 3.5X10 (GAUZE/BANDAGES/DRESSINGS) ×1
ELECT REM PT RETURN 15FT ADLT (MISCELLANEOUS) ×2 IMPLANT
GAUZE SPONGE 4X4 12PLY STRL (GAUZE/BANDAGES/DRESSINGS) ×2 IMPLANT
GLOVE BIOGEL PI IND STRL 8 (GLOVE) ×2 IMPLANT
GLOVE SURG ORTHO 8.0 STRL STRW (GLOVE) ×4 IMPLANT
GOWN STRL REUS W/ TWL LRG LVL3 (GOWN DISPOSABLE) IMPLANT
GOWN STRL REUS W/ TWL XL LVL3 (GOWN DISPOSABLE) ×2 IMPLANT
GOWN STRL REUS W/TWL LRG LVL3 (GOWN DISPOSABLE)
GOWN STRL REUS W/TWL XL LVL3 (GOWN DISPOSABLE) ×1
HANDPIECE INTERPULSE COAX TIP (DISPOSABLE) ×1
HDLS TROCR DRIL PIN KNEE 75 (PIN) ×1
HOOD PEEL AWAY T7 (MISCELLANEOUS) ×6 IMPLANT
KIT TURNOVER KIT A (KITS) IMPLANT
MANIFOLD NEPTUNE II (INSTRUMENTS) ×2 IMPLANT
MARKER SKIN DUAL TIP RULER LAB (MISCELLANEOUS) ×2 IMPLANT
NDL SAFETY ECLIP 18X1.5 (MISCELLANEOUS) ×4 IMPLANT
NS IRRIG 1000ML POUR BTL (IV SOLUTION) ×2 IMPLANT
PACK TOTAL KNEE CUSTOM (KITS) ×2 IMPLANT
PART TIBIAL CEM SZ D RT KNEE (Orthopedic Implant) ×1 IMPLANT
PARTVIVACIT-E SZ D 8 KNEE (Orthopedic Implant) ×1 IMPLANT
PIN DRILL HDLS TROCAR 75 4PK (PIN) IMPLANT
PROSTHESIS KNEE VIVACTV E SZD8 (Orthopedic Implant) IMPLANT
SCREW HEADED 33MM KNEE (MISCELLANEOUS) IMPLANT
SCREW HEADED 48MM KNEE (MISCELLANEOUS) IMPLANT
SET HNDPC FAN SPRY TIP SCT (DISPOSABLE) ×2 IMPLANT
STRIP CLOSURE SKIN 1/2X4 (GAUZE/BANDAGES/DRESSINGS) ×2 IMPLANT
SUT MNCRL AB 3-0 PS2 18 (SUTURE) IMPLANT
SUT MNCRL AB 3-0 PS2 27 (SUTURE) ×2 IMPLANT
SUT STRATAFIX 0 PDS 27 VIOLET (SUTURE) ×1
SUT STRATAFIX PDO 1 14 VIOLET (SUTURE) ×1
SUT STRATFX PDO 1 14 VIOLET (SUTURE) ×1
SUT VIC AB 1 CT1 36 (SUTURE) IMPLANT
SUT VIC AB 2-0 CT2 27 (SUTURE) ×4 IMPLANT
SUTURE STRATFX 0 PDS 27 VIOLET (SUTURE) ×2 IMPLANT
SUTURE STRATFX PDO 1 14 VIOLET (SUTURE) ×2 IMPLANT
SYSTEM KNEE CMT PS 1 RT (Joint) IMPLANT
SYSTEM KNEE TIBIAL CEM SZ D RT (Orthopedic Implant) IMPLANT
TRAY FOLEY MTR SLVR 16FR STAT (SET/KITS/TRAYS/PACK) ×2 IMPLANT
TUBE SUCTION HIGH CAP CLEAR NV (SUCTIONS) ×2 IMPLANT
WRAP KNEE MAXI GEL POST OP (GAUZE/BANDAGES/DRESSINGS) ×2 IMPLANT

## 2022-04-16 NOTE — Transfer of Care (Signed)
Immediate Anesthesia Transfer of Care Note  Patient: [name] [name]  Procedure(s) Performed: Procedure(s): UNICOMPARTMENTAL KNEE (Right)  Patient Location: PACU  Anesthesia Type:MAC, spinal, regional  Level of Consciousness: Patient easily awoken, sedated, comfortable, cooperative, following commands, responds to stimulation.   Airway & Oxygen Therapy: Patient spontaneously breathing, ventilating well, oxygen via simple oxygen mask.  Post-op Assessment: Report given to PACU RN, vital signs reviewed and stable, moving all extremities.   Post vital signs: Reviewed and stable.  Complications: No apparent anesthesia complications  Last Vitals:  Vitals Value Taken Time  BP 126/49 04/16/22 0911  Temp    Pulse 71 04/16/22 0913  Resp 15 04/16/22 0913  SpO2 98 % 04/16/22 0913  Vitals shown include unvalidated device data.  Last Pain:  Vitals:   04/16/22 0552  TempSrc: Oral         Complications: No notable events documented.

## 2022-04-16 NOTE — Anesthesia Procedure Notes (Signed)
Procedure Name: MAC Date/Time: 04/16/2022 7:26 AM  Performed by: Deliah Boston, CRNAPre-anesthesia Checklist: Patient identified, Emergency Drugs available, Suction available and Patient being monitored Patient Re-evaluated:Patient Re-evaluated prior to induction Oxygen Delivery Method: Simple face mask Placement Confirmation: positive ETCO2 and breath sounds checked- equal and bilateral Dental Injury: Teeth and Oropharynx as per pre-operative assessment

## 2022-04-16 NOTE — Care Plan (Signed)
Ortho Bundle Case Management Note  Patient Details  Name: [name] [name] MRN: [id number] Date of Birth: 31-Mar-1946  met with patient in the office for H&P. She will discharge to home with family to assist. has equipment. OPPT set up with [facility]. discharge instructions discussed and questions answered.  Patient and MD in agreement with plan. CHoice offered   DME Arranged:  [facility] DME Agency:  [employer]  HH Arranged:    [facility] Agency:     Additional Comments: Please contact me with any questions of if this plan should need to change.  Ladell Heads,  [facility]  [phone] 04/16/2022, 10:33 AM

## 2022-04-16 NOTE — Evaluation (Signed)
Physical Therapy Evaluation Patient Details Name: [name] [name] MRN: [id number] DOB: 1945/10/16 Today's Date: 04/16/2022  History of Present Illness  Pt is a 77yo female presenting s/p R-UKA on 04/16/22. PMH: COPD, HTN, HLD, DM, RLS, L-UKA 2015, L-RTSA 2022.   Clinical Impression  [name] [name] is a 77 y.o. female POD 0 s/p R-UKA. Patient reports independence with mobility at baseline. Patient is now limited by functional impairments (see PT problem list below) and requires min guard for transfers and gait with RW. Patient was able to ambulate 80 feet with RW and min guard and cues for safe walker management. Patient educated on safe sequencing for stair mobility and verbalized safe guarding position for people assisting with mobility. Patient instructed in exercises to facilitate ROM and circulation. Patient will benefit from continued skilled PT interventions to address impairments and progress towards PLOF. Patient has met mobility goals at adequate level for discharge home; will continue to follow if pt continues acute stay to progress towards Mod I goals.       Recommendations for follow up therapy are one component of a multi-disciplinary discharge planning process, led by the attending physician.  Recommendations may be updated based on patient status, additional functional criteria and insurance authorization.  Follow Up Recommendations Follow physician's recommendations for discharge plan and follow up therapies      Assistance Recommended at Discharge Frequent or constant Supervision/Assistance  Patient can return home with the following  A little help with walking and/or transfers;A little help with bathing/dressing/bathroom;Assistance with cooking/housework;Help with stairs or ramp for entrance;Assist for transportation    Equipment Recommendations Rolling walker (2 wheels) (Pt has standard RW and husband has 4WRW, neither of which are appropriate.)  Recommendations for Other  Services       Functional Status Assessment Patient has had a recent decline in their functional status and demonstrates the ability to make significant improvements in function in a reasonable and predictable amount of time.     Precautions / Restrictions Precautions Precautions: Knee Precaution Booklet Issued: No Precaution Comments: no pillow under the knee Restrictions Weight Bearing Restrictions: No Other Position/Activity Restrictions: wbat      Mobility  Bed Mobility Overal bed mobility: Modified Independent             General bed mobility comments: Increased time    Transfers Overall transfer level: Needs assistance Equipment used: Rolling walker (2 wheels) Transfers: Sit to/from Stand Sit to Stand: Min guard, From elevated surface           General transfer comment: For safety from PACU stretcher    Ambulation/Gait Ambulation/Gait assistance: Min guard Gait Distance (Feet): 80 Feet Assistive device: Rolling walker (2 wheels) Gait Pattern/deviations: WFL(Within Functional Limits) Gait velocity: decreased     General Gait Details: Pt ambulated with RW, no physical assist required or overt LOB noted. Pt picking up AD at times and required regular VCs to maintain AD on the floor and to keep AD close.  Stairs            Wheelchair Mobility    Modified Rankin (Stroke Patients Only)       Balance Overall balance assessment: Needs assistance Sitting-balance support: Feet supported, No upper extremity supported Sitting balance-Leahy Scale: Good     Standing balance support: Reliant on assistive device for balance, During functional activity, Bilateral upper extremity supported Standing balance-Leahy Scale: Poor  Pertinent Vitals/Pain Pain Assessment Pain Assessment: No/denies pain    Home Living Family/patient expects to be discharged to:: Private residence Living Arrangements:  Spouse/significant other;Other relatives Available Help at Discharge: Family;Available 24 hours/day Type of Home: House Home Access: Stairs to enter Entrance Stairs-Rails: None Entrance Stairs-Number of Steps: 1   Home Layout: One level Home Equipment: Conservation officer, nature (2 wheels);BSC/3in1;Shower seat;Cane - single point      Prior Function Prior Level of Function : Independent/Modified Independent             Mobility Comments: IND ADLs Comments: IND     Hand Dominance   Dominant Hand: Right    Extremity/Trunk Assessment   Upper Extremity Assessment Upper Extremity Assessment: Overall WFL for tasks assessed    Lower Extremity Assessment Lower Extremity Assessment: RLE deficits/detail;LLE deficits/detail RLE Deficits / Details: MMT ank DF/PF 5/5, No extensor lag noted RLE Sensation: WNL LLE Deficits / Details: MMT ank DF/PF 5/5 LLE Sensation: WNL    Cervical / Trunk Assessment Cervical / Trunk Assessment: Kyphotic  Communication   Communication: No difficulties  Cognition Arousal/Alertness: Awake/alert Behavior During Therapy: WFL for tasks assessed/performed, Impulsive Overall Cognitive Status: Within Functional Limits for tasks assessed                                 General Comments: Pt impulsive, moves before directives, requires close guarding.        General Comments General comments (skin integrity, edema, etc.): Husband and son present    Exercises Total Joint Exercises Ankle Circles/Pumps: AROM, Both, 10 reps Quad Sets: AROM, Right, Other reps (comment) (2) Short Arc Quad: AROM, Right, Other reps (comment) (2) Heel Slides: AROM, Right, Other reps (comment) (2) Hip ABduction/ADduction: AROM, Right, Other reps (comment) (2) Straight Leg Raises: AROM, Right, Other reps (comment) (3) Goniometric ROM: -5-90deg by gross visual approximation   Assessment/Plan    PT Assessment Patient needs continued PT services  PT Problem List  Decreased strength;Decreased range of motion;Decreased activity tolerance;Decreased balance;Decreased mobility;Pain       PT Treatment Interventions DME instruction;Gait training;Stair training;Functional mobility training;Therapeutic activities;Therapeutic exercise;Balance training;Neuromuscular re-education;Patient/family education    PT Goals (Current goals can be found in the Care Plan section)  Acute Rehab PT Goals Patient Stated Goal: Go home PT Goal Formulation: With patient Time For Goal Achievement: 04/23/22 Potential to Achieve Goals: Good    Frequency 7X/week     Co-evaluation               AM-PAC PT "6 Clicks" Mobility  Outcome Measure Help needed turning from your back to your side while in a flat bed without using bedrails?: None Help needed moving from lying on your back to sitting on the side of a flat bed without using bedrails?: A Little Help needed moving to and from a bed to a chair (including a wheelchair)?: A Little Help needed standing up from a chair using your arms (e.g., wheelchair or bedside chair)?: A Little Help needed to walk in hospital room?: A Little Help needed climbing 3-5 steps with a railing? : A Little 6 Click Score: 19    End of Session Equipment Utilized During Treatment: Gait belt Activity Tolerance: Patient tolerated treatment well;No increased pain Patient left: in chair;with call bell/phone within reach Nurse Communication: Mobility status PT Visit Diagnosis: Pain;Difficulty in walking, not elsewhere classified (R26.2) Pain - Right/Left: Right Pain - part of body: Knee  Time: 1227-1255 PT Time Calculation (min) (ACUTE ONLY): 28 min   Charges:   PT Evaluation $PT Eval Low Complexity: 1 Low PT Treatments $Gait Training: 8-22 mins        Jamesetta Geralds, PT, DPT [facility] Office: [phone] Weekend pager: [phone]  Jamesetta Geralds 04/16/2022, 1:04 PM

## 2022-04-16 NOTE — Discharge Instructions (Addendum)

## 2022-04-16 NOTE — Progress Notes (Signed)
Orthopedic Tech Progress Note Patient Details:  [name] [name] 1945-06-03 [id number]  Patient ID: [name] [name], female   DOB: Oct 20, 1945, 77 y.o.   MRN: [id number]  [name] [name] 04/16/2022, 10:00 AM Bone foam applied in pacu

## 2022-04-16 NOTE — Anesthesia Postprocedure Evaluation (Signed)
Anesthesia Post Note  Patient: [name] [name]  Procedure(s) Performed: UNICOMPARTMENTAL KNEE (Right: Knee)     Patient location during evaluation: PACU Anesthesia Type: MAC and Spinal Level of consciousness: awake and alert Pain management: pain level controlled Vital Signs Assessment: post-procedure vital signs reviewed and stable Respiratory status: spontaneous breathing and respiratory function stable Cardiovascular status: blood pressure returned to baseline and stable Postop Assessment: spinal receding Anesthetic complications: no  No notable events documented.  Last Vitals:  Vitals:   04/16/22 1045 04/16/22 1100  BP: 131/65 131/61  Pulse: 71 70  Resp: 18 20  Temp:    SpO2: (!) 89% 91%    Last Pain:  Vitals:   04/16/22 1030  TempSrc:   PainSc: 0-No pain                 [name]

## 2022-04-16 NOTE — Anesthesia Procedure Notes (Signed)
Anesthesia Regional Block: Adductor canal block   Pre-Anesthetic Checklist: , timeout performed,  Correct Patient, Correct Site, Correct Laterality,  Correct Procedure, Correct Position, site marked,  Risks and benefits discussed,  Surgical consent,  Pre-op evaluation,  At surgeon's request and post-op pain management  Laterality: Right  Prep: chloraprep       Needles:  Injection technique: Single-shot  Needle Type: Stimulator Needle - 80     Needle Length: 10cm  Needle Gauge: 21     Additional Needles:   Narrative:  Start time: 04/16/2022 6:54 AM End time: 04/16/2022 7:04 AM Injection made incrementally with aspirations every 5 mL.  Performed by: Personally  Anesthesiologist: Duane Boston, MD

## 2022-04-16 NOTE — Op Note (Addendum)
04/16/2022  8:57 AM  PATIENT:  [name] [name]    PRE-OPERATIVE DIAGNOSIS: Right medial compartment end-stage osteoarthritis  POST-OPERATIVE DIAGNOSIS:  Same  PROCEDURE:  Medial unicompartmental Knee Arthroplasty  SURGEON:  Willaim Sheng, MD  PHYSICIAN ASSISTANT: Charlies Constable, MD, present and scrubbed throughout the case, critical for completion in a timely fashion, and for retraction, instrumentation, and closure.  ANESTHESIA:   spinal  ESTIMATED BLOOD LOSS: 25cc  PREOPERATIVE INDICATIONS:  [name] [name] is a  77 y.o. female with a diagnosis of Right medial compartment end-stage osteoarthritis who failed conservative measures and elected for surgical management.    The risks benefits and alternatives were discussed with the patient preoperatively including but not limited to the risks of infection, bleeding, nerve injury, cardiopulmonary complications, blood clots, the need for revision surgery, among others, and the patient was willing to proceed.  OPERATIVE IMPLANTS: [employer] PPK fixed bearing medial compartment arthroplasty femur size 1, tibia size D, bearing size 68mm.  OPERATIVE FINDINGS: Endstage grade 4 anteromedial compartment osteoarthritis. No significant changes in the lateral or patellofemoral joint.  The ACL was intact.  OPERATIVE PROCEDURE:   Once adequate anesthesia, preoperative antibiotics, 2 gm of ancef, 1 gm of vancomycin,1 gm of Tranexamic Acid, and 8 mg of Decadron administered, the patient was positioned supine with a right thigh tourniquet placed.  The right lower extremity was prepped and draped in sterile fashion.  A time-out was performed identifying the patient, planned procedure, and the appropriate extremity.   The leg was elevated and exsanguinated and the tourniquet was inflated. Anterior midline incision was performed. Medial Parapatellar incision was carried out, and the osteophytes were excised, along with the medial plateau and femoral  condyle. Resected anterior horn of medial meniscus and a small portion of the fat pad. Medial release was performed.  Remainder of the knee was evaluated.  ACL was intact lateral compartment with no significant wear.  Patellofemoral compartment with mild wear.  The extra medullary tibial cutting jig was applied, and resection was performed measuring 4 mm from the anterior medial defect.  Cut was made perpendicular to the axis of the tibia, matching the patient's native slope, and sagittal cut was made with appropriate rotation and just medial to the ACL insertion.    The proximal tibial bony cut was removed in one piece, and I turned my attention to the femur.  Rasp was used to clear debris from the corner of the cut.  We next turned our attention to the femur.  Distal femoral cutting block was put in place and pinned and the distal femur was cut.  The cutting guide was removed and the cut was completed with the knee in flexion.  We then sized the femur to be a 1 and then pinned the femur cutting guide into place taking care to appropriately lateralized and not overhang the component, medially or anteriorly.  The femur was drilled and the posterior and chamfer cuts were made.  With the knee in flexion medial meniscectomy was performed.  We next size of the tibia and found it to be a size D.  The tibial trial was then impacted into place in the right position pinned and the lugs were drilled.  The trial femoral component was then impacted, and a size D liner trial was inserted.  With the trial liner in place we used the Amber sticks and had approximately 2 mm of laxity in extension and 52mm of laxity in flexion.  The femur also  tracked appropriately on the center of the tibial component through flexion.   I then cemented the components into place, cementing the tibia first, removing all excess cement, and then cementing the femur.  All loose cement was removed.  The trial liner was again  inserted until cement had completely set.  The wounds were thoroughly irrigated with normal saline pulse lavage and injected with dilute Exparel.  Once the cement was set again we assessed stability and balance which we felt was appropriate with a size 76mm liner.  The real polyethylene liner was opened and inserted.   The tourniquet was let down  No significant hemostasis was required.  The medial parapatellar arthrotomy was then reapproximated using #1 Stratafix sutures with the knee  in flexion.  The   remaining wound was closed with 0 stratafix, 2-0 Vicryl, and running 3-0 Monocryl.   The knee was cleaned, dried, dressed sterilely using Dermabond and Aquacel dressing.  The patient was then  brought to recovery room in stable condition, tolerating the procedure well. There were no complications.   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 at [facility].  Address: [address], [place], [place]  Office Phone: [phone]  [phone], MD Orthopaedic Surgery

## 2022-04-16 NOTE — Interval H&P Note (Signed)
The patient has been re-examined, and the chart reviewed, and there have been no interval changes to the documented history and physical.    Plan for R medial UKA for medial compartment OA.  The operative side was examined and the patient was confirmed to have sensation to DPN, SPN, TN intact, Motor EHL, ext, flex 5/5, and DP 2+, PT 2+, No significant edema.   The risks, benefits, and alternatives have been discussed at length with patient, and the patient is willing to proceed.  Right knee marked. Consent has been signed.

## 2022-04-18 ENCOUNTER — Encounter (HOSPITAL_COMMUNITY): Payer: Self-pay | Admitting: Orthopedic Surgery

## 2022-04-30 DIAGNOSIS — M25561 Pain in right knee: Secondary | ICD-10-CM | POA: Diagnosis not present

## 2022-04-30 DIAGNOSIS — M1711 Unilateral primary osteoarthritis, right knee: Secondary | ICD-10-CM | POA: Diagnosis not present

## 2022-05-01 DIAGNOSIS — M1711 Unilateral primary osteoarthritis, right knee: Secondary | ICD-10-CM | POA: Diagnosis not present

## 2022-05-02 ENCOUNTER — Ambulatory Visit (INDEPENDENT_AMBULATORY_CARE_PROVIDER_SITE_OTHER): Payer: Medicare HMO | Admitting: Family Medicine

## 2022-05-02 VITALS — BP 124/64 | HR 69 | Temp 98.0°F | Wt 137.8 lb

## 2022-05-02 DIAGNOSIS — J441 Chronic obstructive pulmonary disease with (acute) exacerbation: Secondary | ICD-10-CM | POA: Insufficient documentation

## 2022-05-02 MED ORDER — PREDNISONE 50 MG PO TABS: ORAL_TABLET | ORAL | 0 refills | Status: DC

## 2022-05-02 MED ORDER — AMOXICILLIN-POT CLAVULANATE 875-125 MG PO TABS: 1.0000 | ORAL_TABLET | Freq: Two times a day (BID) | ORAL | 0 refills | Status: DC

## 2022-05-02 MED ORDER — PROMETHAZINE-DM 6.25-15 MG/5ML PO SYRP: 5.0000 mL | ORAL_SOLUTION | Freq: Four times a day (QID) | ORAL | 0 refills | Status: DC | PRN

## 2022-05-02 NOTE — Progress Notes (Signed)
Subjective:  Patient ID: [name] [name], female    DOB: 05-Feb-1946  Age: 77 y.o. MRN: [id number]  CC: Chief Complaint  Patient presents with   Cough    Consistent, dark brown sputum, no fever, 2-3 weeks Wheezing denies SOB     HPI:  77 year old female with COPD presents for evaluation of the above.  Patient reports 2-3 week history of cough. Cough is productive of dark, brown sputum. No fever. No SOB. No relieving factors. No other complaints at this time.  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      Social Hx   Social History   Socioeconomic History   Marital status: Married    Spouse name: Ruby Cola   Number of children: Not on file   Years of education: Not on file   Highest education level: High school graduate  Occupational History   Occupation: Retired  Tobacco Use   Smoking status: Former    Packs/day: 1.50    Years: 35.00    Total pack years: 52.50    Types: Cigarettes    Quit date: 04/10/2003    Years since quitting: 19.0   Smokeless tobacco: Never  Vaping Use   Vaping Use: Never used  Substance and Sexual Activity   Alcohol use: No   Drug use: No   Sexual activity: Not Currently  Other Topics Concern   Not on file  Social History Narrative   Married 22 years in 2nd marriage.First marriage lasted 56 years.Retired,now a Theme park manager.   Social Determinants of Health   Financial Resource Strain: Low Risk  (09/05/2021)   Overall Financial Resource Strain (CARDIA)     Difficulty of Paying Living Expenses: Not hard at all  Food Insecurity: No Food Insecurity (09/05/2021)   Hunger Vital Sign    Worried About Running Out of Food in the Last Year: Never true    Ran Out of Food in the Last Year: Never true  Transportation Needs: No Transportation Needs (09/05/2021)   PRAPARE - Hydrologist (Medical): No    Lack of Transportation (Non-Medical): No  Physical Activity: Sufficiently Active (09/05/2021)   Exercise Vital Sign    Days of Exercise per Week: 5 days    Minutes of Exercise per Session: 30 min  Stress: No Stress Concern Present (09/05/2021)   [place]    Feeling of Stress : Not at all  Social Connections: Moderately Isolated (09/05/2021)   Social Connection and Isolation Panel [NHANES]    Frequency of Communication with Friends and Family: More than three times a week    Frequency of Social Gatherings with Friends and Family: More than three times a week    Attends Religious Services: Never    Marine scientist or Organizations: No    Attends Archivist Meetings: Never    Marital Status: Married    Review of Systems Per HPI  Objective:  BP 124/64   Pulse  69   Temp 98 F (36.7 C) (Temporal)   Wt 137 lb 12.8 oz (62.5 kg)   SpO2 95%   BMI 26.91 kg/m      05/02/2022   11:08 AM 04/16/2022   11:15 AM 04/16/2022   11:00 AM  BP/Weight  Systolic BP 381 017 510  Diastolic BP 64 64 61  Wt. (Lbs) 137.8    BMI 26.91 kg/m2      Physical Exam Vitals and nursing note reviewed.  Constitutional:      General: She is not in acute distress.    Appearance: Normal appearance.  HENT:     Head: Normocephalic and atraumatic.  Eyes:     General:        Right eye: No discharge.        Left eye: No discharge.     Conjunctiva/sclera: Conjunctivae normal.  Cardiovascular:     Rate and Rhythm: Normal rate and regular rhythm.  Pulmonary:      Effort: Pulmonary effort is normal.     Breath sounds: Normal breath sounds. No wheezing or rales.  Neurological:     Mental Status: She is alert.  Psychiatric:        Mood and Affect: Mood normal.        Behavior: Behavior normal.     Lab Results  Component Value Date   WBC 8.0 04/05/2022   HGB 12.8 04/05/2022   HCT 41.6 04/05/2022   PLT 305 04/05/2022   GLUCOSE 95 04/05/2022   CHOL 197 06/19/2021   TRIG 101 06/19/2021   HDL 56 06/19/2021   LDLCALC 123 (H) 06/19/2021   ALT 21 06/19/2021   AST 21 06/19/2021   NA 139 04/05/2022   K 4.5 04/05/2022   CL 106 04/05/2022   CREATININE 0.87 04/05/2022   BUN 16 04/05/2022   CO2 25 04/05/2022   TSH 0.753 11/20/2021   INR 0.90 08/14/2013   HGBA1C 5.7 (H) 04/05/2022     Assessment & Plan:   Problem List Items Addressed This Visit       Respiratory   COPD exacerbation ([place]) - Primary    Treating with Prednisone, Augmentin, and Promethazine DM.      Relevant Medications   predniSONE (DELTASONE) 50 MG tablet   promethazine-dextromethorphan (PROMETHAZINE-DM) 6.25-15 MG/5ML syrup    Meds ordered this encounter  Medications   predniSONE (DELTASONE) 50 MG tablet    Sig: 1 tablet daily x 5 days    Dispense:  5 tablet    Refill:  0   amoxicillin-clavulanate (AUGMENTIN) 875-125 MG tablet    Sig: Take 1 tablet by mouth 2 (two) times daily.    Dispense:  14 tablet    Refill:  0   promethazine-dextromethorphan (PROMETHAZINE-DM) 6.25-15 MG/5ML syrup    Sig: Take 5 mLs by mouth 4 (four) times daily as needed for cough.    Dispense:  118 mL    Refill:  0    Follow-up:  Return if symptoms worsen or fail to improve.  [place]

## 2022-05-02 NOTE — Assessment & Plan Note (Signed)
Treating with Prednisone, Augmentin, and Promethazine DM.

## 2022-05-03 DIAGNOSIS — M1711 Unilateral primary osteoarthritis, right knee: Secondary | ICD-10-CM | POA: Diagnosis not present

## 2022-05-03 DIAGNOSIS — M25561 Pain in right knee: Secondary | ICD-10-CM | POA: Diagnosis not present

## 2022-05-07 DIAGNOSIS — M25561 Pain in right knee: Secondary | ICD-10-CM | POA: Diagnosis not present

## 2022-05-07 DIAGNOSIS — M1711 Unilateral primary osteoarthritis, right knee: Secondary | ICD-10-CM | POA: Diagnosis not present

## 2022-05-09 DIAGNOSIS — M1711 Unilateral primary osteoarthritis, right knee: Secondary | ICD-10-CM | POA: Diagnosis not present

## 2022-05-09 DIAGNOSIS — M25561 Pain in right knee: Secondary | ICD-10-CM | POA: Diagnosis not present

## 2022-05-16 DIAGNOSIS — M1711 Unilateral primary osteoarthritis, right knee: Secondary | ICD-10-CM | POA: Diagnosis not present

## 2022-05-16 DIAGNOSIS — M25561 Pain in right knee: Secondary | ICD-10-CM | POA: Diagnosis not present

## 2022-05-21 DIAGNOSIS — M25561 Pain in right knee: Secondary | ICD-10-CM | POA: Diagnosis not present

## 2022-05-21 DIAGNOSIS — M1711 Unilateral primary osteoarthritis, right knee: Secondary | ICD-10-CM | POA: Diagnosis not present

## 2022-05-23 ENCOUNTER — Ambulatory Visit (INDEPENDENT_AMBULATORY_CARE_PROVIDER_SITE_OTHER): Payer: Medicare HMO | Admitting: Family Medicine

## 2022-05-23 DIAGNOSIS — F5105 Insomnia due to other mental disorder: Secondary | ICD-10-CM | POA: Diagnosis not present

## 2022-05-23 DIAGNOSIS — R519 Headache, unspecified: Secondary | ICD-10-CM

## 2022-05-23 DIAGNOSIS — I10 Essential (primary) hypertension: Secondary | ICD-10-CM | POA: Diagnosis not present

## 2022-05-23 DIAGNOSIS — E039 Hypothyroidism, unspecified: Secondary | ICD-10-CM | POA: Diagnosis not present

## 2022-05-23 DIAGNOSIS — G2581 Restless legs syndrome: Secondary | ICD-10-CM | POA: Diagnosis not present

## 2022-05-23 DIAGNOSIS — F99 Mental disorder, not otherwise specified: Secondary | ICD-10-CM

## 2022-05-23 DIAGNOSIS — E785 Hyperlipidemia, unspecified: Secondary | ICD-10-CM | POA: Diagnosis not present

## 2022-05-23 DIAGNOSIS — G8929 Other chronic pain: Secondary | ICD-10-CM | POA: Diagnosis not present

## 2022-05-23 MED ORDER — AMLODIPINE BESYLATE 5 MG PO TABS: 5.0000 mg | ORAL_TABLET | Freq: Every day | ORAL | 1 refills | Status: DC

## 2022-05-23 MED ORDER — GABAPENTIN 300 MG PO CAPS: 600.0000 mg | ORAL_CAPSULE | Freq: Every day | ORAL | 1 refills | Status: DC

## 2022-05-23 MED ORDER — AMITRIPTYLINE HCL 75 MG PO TABS: 75.0000 mg | ORAL_TABLET | Freq: Every day | ORAL | 1 refills | Status: DC

## 2022-05-23 MED ORDER — LEVOTHYROXINE SODIUM 88 MCG PO TABS: 88.0000 ug | ORAL_TABLET | Freq: Every day | ORAL | 2 refills | Status: DC

## 2022-05-23 NOTE — Assessment & Plan Note (Signed)
Stable on amlodipine.  Continue.  Refilled today.

## 2022-05-23 NOTE — Progress Notes (Signed)
Subjective:  Patient ID: [name] [name], female    DOB: 1945/07/17  Age: 77 y.o. MRN: [id number]  CC: Chief Complaint  Patient presents with   Hypertension    HPI:  77 year old female with the below mentioned medical problems presents for follow-up.  Hypertension stable on amlodipine.  Needs refill.  Patient states that she is doing well since recent knee surgery.  Patient states that she is sleeping well.    Hypothyroidism stable on 88 mcg of levothyroxine.  Patient's hyperlipidemia is mild.  She is on no treatment at this time.  Patient Active Problem List   Diagnosis Date Noted   Hyperlipidemia 02/20/2022   Chronic headaches 02/20/2022   Prediabetes 11/20/2021   S/P reverse total shoulder arthroplasty, 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      Social Hx   Social History   Socioeconomic History   Marital status: Married    Spouse name: Ruby Cola   Number of children: Not on file   Years of education: Not on file   Highest education level: High school graduate  Occupational History   Occupation: Retired  Tobacco Use   Smoking status: Former    Packs/day: 1.50    Years: 35.00    Total pack years: 52.50    Types: Cigarettes    Quit date: 04/10/2003    Years since quitting: 19.1   Smokeless tobacco: Never  Vaping Use   Vaping Use: Never used  Substance and Sexual Activity   Alcohol use: No   Drug use: No   Sexual activity: Not Currently  Other Topics Concern   Not on file  Social History Narrative   Married 22 years in 2nd marriage.First marriage lasted 66 years.Retired,now a Theme park manager.   Social Determinants of Health   Financial Resource Strain: Low Risk  (09/05/2021)   Overall  Financial Resource Strain (CARDIA)    Difficulty of Paying Living Expenses: Not hard at all  Food Insecurity: No Food Insecurity (09/05/2021)   Hunger Vital Sign    Worried About Running Out of Food in the Last Year: Never true    Ran Out of Food in the Last Year: Never true  Transportation Needs: No Transportation Needs (09/05/2021)   PRAPARE - Hydrologist (Medical): No    Lack of Transportation (Non-Medical): No  Physical Activity: Sufficiently Active (09/05/2021)   Exercise Vital Sign    Days of Exercise per Week: 5 days    Minutes of Exercise per Session: 30 min  Stress: No Stress Concern Present (09/05/2021)   [place]    Feeling of Stress : Not at all  Social Connections: Moderately Isolated (09/05/2021)   Social Connection and Isolation Panel [NHANES]    Frequency of Communication with Friends and Family: More than three times a week    Frequency of Social Gatherings with Friends and Family: More than three times a week    Attends Religious Services: Never    Marine scientist or Organizations: No    Attends Archivist Meetings: Never    Marital Status: Married    Review of Systems  Constitutional: Negative.   Respiratory: Negative.  Cardiovascular: Negative.    Objective:  BP 123/75   Pulse 79   Ht 5' (1.524 m)   Wt 136 lb (61.7 kg)   SpO2 99%   BMI 26.56 kg/m      05/23/2022   10:33 AM 05/02/2022   11:08 AM 04/16/2022   11:15 AM  BP/Weight  Systolic BP [id number]  Diastolic BP 75 64 64  Wt. (Lbs) 136 137.8   BMI 26.56 kg/m2 26.91 kg/m2    Physical Exam Vitals and nursing note reviewed.  Constitutional:      General: She is not in acute distress.    Appearance: Normal appearance.  HENT:     Head: Normocephalic and atraumatic.  Eyes:     General:        Right eye: No discharge.        Left eye: No discharge.     Conjunctiva/sclera:  Conjunctivae normal.  Cardiovascular:     Rate and Rhythm: Normal rate and regular rhythm.  Pulmonary:     Effort: Pulmonary effort is normal.     Breath sounds: Normal breath sounds. No wheezing, rhonchi or rales.  Neurological:     Mental Status: She is alert.  Psychiatric:        Mood and Affect: Mood normal.        Behavior: Behavior normal.     Lab Results  Component Value Date   WBC 8.0 04/05/2022   HGB 12.8 04/05/2022   HCT 41.6 04/05/2022   PLT 305 04/05/2022   GLUCOSE 95 04/05/2022   CHOL 197 06/19/2021   TRIG 101 06/19/2021   HDL 56 06/19/2021   LDLCALC 123 (H) 06/19/2021   ALT 21 06/19/2021   AST 21 06/19/2021   NA 139 04/05/2022   K 4.5 04/05/2022   CL 106 04/05/2022   CREATININE 0.87 04/05/2022   BUN 16 04/05/2022   CO2 25 04/05/2022   TSH 0.753 11/20/2021   INR 0.90 08/14/2013   HGBA1C 5.7 (H) 04/05/2022     Assessment & Plan:   Problem List Items Addressed This Visit       Cardiovascular and Mediastinum   Essential hypertension, benign    Stable on amlodipine.  Continue.  Refilled today.      Relevant Medications   amLODipine (NORVASC) 5 MG tablet     Endocrine   Hypothyroidism    Stable on current dosing of Synthroid.  Continue.  Refilled today.      Relevant Medications   levothyroxine (SYNTHROID) 88 MCG tablet     Other   Restless leg syndrome   Relevant Medications   gabapentin (NEURONTIN) 300 MG capsule   Insomnia    Continue alprazolam.      Hyperlipidemia    No treatment at this time.  Will continue to monitor.      Relevant Medications   amLODipine (NORVASC) 5 MG tablet   Chronic headaches    Continue amitriptyline.  Refilled today.      Relevant Medications   amitriptyline (ELAVIL) 75 MG tablet   amLODipine (NORVASC) 5 MG tablet   gabapentin (NEURONTIN) 300 MG capsule    Meds ordered this encounter  Medications   amitriptyline (ELAVIL) 75 MG tablet    Sig: Take 1 tablet (75 mg total) by mouth daily.     Dispense:  90 tablet    Refill:  1   amLODipine (NORVASC) 5 MG tablet    Sig: Take 1 tablet (5 mg total) by mouth daily.  Dispense:  90 tablet    Refill:  1   gabapentin (NEURONTIN) 300 MG capsule    Sig: Take 2 capsules (600 mg total) by mouth at bedtime.    Dispense:  180 capsule    Refill:  1   levothyroxine (SYNTHROID) 88 MCG tablet    Sig: Take 1 tablet (88 mcg total) by mouth daily.    Dispense:  90 tablet    Refill:  2    Follow-up:  Return in about 6 months (around 11/21/2022).  [place]

## 2022-05-23 NOTE — Patient Instructions (Signed)
Continue your medications.  Follow up in 6 months.  Take care  Dr. Lacinda Axon

## 2022-05-23 NOTE — Assessment & Plan Note (Signed)
Stable on current dosing of Synthroid.  Continue.  Refilled today.

## 2022-05-23 NOTE — Assessment & Plan Note (Signed)
Continue amitriptyline.  Refilled today.

## 2022-05-23 NOTE — Assessment & Plan Note (Signed)
No treatment at this time.  Will continue to monitor.

## 2022-05-23 NOTE — Assessment & Plan Note (Signed)
Continue alprazolam 

## 2022-05-28 DIAGNOSIS — M25561 Pain in right knee: Secondary | ICD-10-CM | POA: Diagnosis not present

## 2022-05-28 DIAGNOSIS — M1711 Unilateral primary osteoarthritis, right knee: Secondary | ICD-10-CM | POA: Diagnosis not present

## 2022-05-31 DIAGNOSIS — M1711 Unilateral primary osteoarthritis, right knee: Secondary | ICD-10-CM | POA: Diagnosis not present

## 2022-08-15 ENCOUNTER — Other Ambulatory Visit: Payer: Self-pay | Admitting: Internal Medicine

## 2022-10-05 ENCOUNTER — Ambulatory Visit (INDEPENDENT_AMBULATORY_CARE_PROVIDER_SITE_OTHER): Payer: Medicare HMO

## 2022-10-05 VITALS — Ht 59.0 in | Wt 138.0 lb

## 2022-10-05 DIAGNOSIS — Z780 Asymptomatic menopausal state: Secondary | ICD-10-CM

## 2022-10-05 DIAGNOSIS — Z0000 Encounter for general adult medical examination without abnormal findings: Secondary | ICD-10-CM

## 2022-10-05 DIAGNOSIS — Z1231 Encounter for screening mammogram for malignant neoplasm of breast: Secondary | ICD-10-CM

## 2022-10-05 NOTE — Progress Notes (Signed)
 Subjective:   [name] [name] is a 77 y.o. female who presents for Medicare Annual (Subsequent) preventive examination.  Visit Complete: Virtual  I connected with  [name] on 10/05/22 by a audio enabled telemedicine application and verified that I am speaking with the correct person using two identifiers.  Patient Location: Home  Provider Location: Home Office  I discussed the limitations of evaluation and management by telemedicine. The patient expressed understanding and agreed to proceed.  Patient Medicare AWV questionnaire was completed by the patient on n/a; I have confirmed that all information answered by patient is correct and no changes since this date.  Review of Systems     Cardiac Risk Factors include: advanced age (>77men, >46 women);hypertension     Objective:    Today's Vitals   10/05/22 0935  Weight: 138 lb (62.6 kg)  Height: 4\' 11"  (1.499 m)   Body mass index is 27.87 kg/m.     10/05/2022    9:29 AM 04/16/2022    6:08 AM 04/05/2022    9:34 AM 09/05/2021    1:46 PM 02/10/2021   11:04 AM 08/29/2020   10:52 AM 07/20/2016    8:47 PM  Advanced Directives  Does Patient Have a Medical Advance Directive? No No No Yes No No No  Type of Theme park manager;Living will     Copy of Healthcare Power of Attorney in Chart?    Yes - validated most recent copy scanned in chart (See row information)     Would patient like information on creating a medical advance directive? No - Patient declined No - Patient declined Yes (MAU/Ambulatory/Procedural Areas - Information given)  No - Patient declined No - Patient declined No - Patient declined    Current Medications (verified) Outpatient Encounter Medications as of 10/05/2022  Medication Sig   ALPRAZolam (XANAX) 0.5 MG tablet TAKE 1 TABLET AT BEDTIME AS NEEDED FOR ANXIETY (Patient taking differently: Take 0.5 mg by mouth at bedtime.)   amitriptyline (ELAVIL) 75 MG tablet Take 1 tablet (75 mg  total) by mouth daily.   amLODipine (NORVASC) 5 MG tablet Take 1 tablet (5 mg total) by mouth daily.   gabapentin (NEURONTIN) 300 MG capsule Take 2 capsules (600 mg total) by mouth at bedtime.   levothyroxine (SYNTHROID) 88 MCG tablet Take 1 tablet (88 mcg total) by mouth daily.   famotidine (PEPCID) 20 MG tablet TAKE 1 TABLET AFTER SUPPER (Patient not taking: Reported on 10/05/2022)   SPIKEVAX syringe Inject 0.5 mLs into the muscle once. (Patient not taking: Reported on 10/05/2022)   No facility-administered encounter medications on file as of 10/05/2022.    Allergies (verified) Codeine   History: Past Medical History:  Diagnosis Date   COPD (chronic obstructive pulmonary disease) (HCC)    Depression    Dyspnea    Essential hypertension, benign 01/21/2019   History of pneumonia    2010 on ventalator   History of recurrent UTIs    Hyperlipidemia    Hypothyroidism    Insomnia 12/18/2018   Left knee DJD    Osteoporosis    Pneumonia    Pre-diabetes    PTSD (post-traumatic stress disorder)    Restless leg syndrome    Sleep apnea    Past Surgical History:  Procedure Laterality Date   ABDOMINAL HYSTERECTOMY     still had both ovaries   COLONOSCOPY  12/13/2004   Dr. Gabriel Cirri; Normal exam   KNEE ARTHROSCOPY WITH MEDIAL MENISECTOMY  Left 08/24/2013   Procedure: LEFT KNEE ARTHROSCOPY WITH MEDIAL MENISCECTOMY  ;  Surgeon: Nilda Simmer, MD;  Location: [facility] OR;  Service: Orthopedics;  Laterality: Left;   PARTIAL KNEE ARTHROPLASTY Left 08/24/2013   Procedure: LEFT KNEE ARTHROPLASTY UNI COMPARTMENTAL ;  Surgeon: Nilda Simmer, MD;  Location: [facility] OR;  Service: Orthopedics;  Laterality: Left;  Left knee arthroplasty uni compartment  With left knee scope medial meniscectomy     PARTIAL KNEE ARTHROPLASTY Right 04/16/2022   Procedure: UNICOMPARTMENTAL KNEE;  Surgeon: Joen Laura, MD;  Location: [facility];  Service: Orthopedics;  Laterality: Right;   REVERSE SHOULDER ARTHROPLASTY Left  02/14/2021   Procedure: LEFT REVERSE SHOULDER ARTHROPLASTY;  Surgeon: Cammy Copa, MD;  [facility];  Service: Orthopedics;  Laterality: Left;   Family History  Problem Relation Age of Onset   Stroke Mother    Heart disease Father    Heart attack Father    Hypertension Father    Heart attack Brother    Emphysema Daughter    Emphysema Son    Hypertension Son    Colon cancer Neg Hx    Colon polyps Neg Hx    Social History   Socioeconomic History   Marital status: Married    Spouse name: Kipp Brood   Number of children: Not on file   Years of education: Not on file   Highest education level: High school graduate  Occupational History   Occupation: Retired  Tobacco Use   Smoking status: Former    Packs/day: 1.50    Years: 35.00    Additional pack years: 0.00    Total pack years: 52.50    Types: Cigarettes    Quit date: 04/10/2003    Years since quitting: 19.5   Smokeless tobacco: Never  Vaping Use   Vaping Use: Never used  Substance and Sexual Activity   Alcohol use: No   Drug use: No   Sexual activity: Not Currently  Other Topics Concern   Not on file  Social History Narrative   Married 22 years in 2nd marriage.First marriage lasted 20 years.Retired,now a Education officer, environmental.   Social Determinants of Health   Financial Resource Strain: Low Risk  (10/05/2022)   Overall Financial Resource Strain (CARDIA)    Difficulty of Paying Living Expenses: Not hard at all  Food Insecurity: No Food Insecurity (10/05/2022)   Hunger Vital Sign    Worried About Running Out of Food in the Last Year: Never true    Ran Out of Food in the Last Year: Never true  Transportation Needs: No Transportation Needs (10/05/2022)   PRAPARE - Administrator, Civil Service (Medical): No    Lack of Transportation (Non-Medical): No  Physical Activity: Sufficiently Active (10/05/2022)   Exercise Vital Sign    Days of Exercise per Week: 5 days    Minutes of Exercise per Session: 30 min  Stress:  No Stress Concern Present (10/05/2022)   [employer] of Occupational Health - Occupational Stress Questionnaire    Feeling of Stress : Not at all  Social Connections: Moderately Integrated (10/05/2022)   Social Connection and Isolation Panel [NHANES]    Frequency of Communication with Friends and Family: More than three times a week    Frequency of Social Gatherings with Friends and Family: More than three times a week    Attends Religious Services: More than 4 times per year    Active Member of [employer] or Organizations: No    Attends [employer]  or Organization Meetings: Never    Marital Status: Married    Tobacco Counseling Counseling given: Yes   Clinical Intake:  Pre-visit preparation completed: Yes  Pain : No/denies pain     BMI - recorded: 27.87 Nutritional Status: BMI 25 -29 Overweight Nutritional Risks: None Diabetes: No  How often do you need to have someone help you when you read instructions, pamphlets, or other written materials from your doctor or pharmacy?: 1 - Never  Interpreter Needed?: No  Information entered by ::  Linzie Boursiquot, CMA   Activities of Daily Living    10/05/2022    9:39 AM 04/05/2022    9:38 AM  In your present state of health, do you have any difficulty performing the following activities:  Hearing? 0   Vision? 0   Difficulty concentrating or making decisions? 0   Walking or climbing stairs? 0   Dressing or bathing? 0   Doing errands, shopping? 0 0  Preparing Food and eating ? N   Using the Toilet? N   In the past six months, have you accidently leaked urine? N   Do you have problems with loss of bowel control? N   Managing your Medications? N   Managing your Finances? N   Housekeeping or managing your Housekeeping? N     Patient Care Team: Tommie Sams, DO as PCP - General (Family Medicine) Lanelle Bal, DO as Consulting Physician (Gastroenterology)  Indicate any recent Medical Services you may have received from other than  [facility] providers in the past year (date may be approximate).     Assessment:   This is a routine wellness examination for Brinna.  Hearing/Vision screen Hearing Screening - Comments:: Patient denies any hearing difficulties.    Dietary issues and exercise activities discussed:     Goals Addressed             This Visit's Progress    Patient Stated       Maintain current level of health and activity       Depression Screen    10/05/2022    9:32 AM 05/23/2022   12:00 PM 02/20/2022   10:51 AM 09/05/2021    1:42 PM 01/23/2021   11:00 AM 08/29/2020   10:54 AM 06/13/2020    2:21 PM  PHQ 2/9 Scores  PHQ - 2 Score 0 0 1 0 0 0 2  PHQ- 9 Score  0     2    Fall Risk    10/05/2022    9:31 AM 05/23/2022   11:59 AM 02/20/2022   10:50 AM 11/20/2021    2:24 PM 09/05/2021    1:47 PM  Fall Risk   Falls in the past year? 0 1 1 1  0  Number falls in past yr: 0 1 1 1  0  Injury with Fall? 0 0 0  0  Risk for fall due to : No Fall Risks History of fall(s) Impaired balance/gait Other (Comment) Impaired balance/gait;Impaired mobility  Risk for fall due to: Comment    fell pulling garbage can   Follow up Falls prevention discussed Falls evaluation completed Falls evaluation completed;Education provided Falls evaluation completed Falls prevention discussed    MEDICARE RISK AT HOME:  Medicare Risk at Home - 10/05/22 0936     Any stairs in or around the home? No    If so, are there any without handrails? No    Home free of loose throw rugs in walkways, pet beds, electrical cords, etc?  Yes    Adequate lighting in your home to reduce risk of falls? Yes    Life alert? No    Use of a cane, walker or w/c? No    Grab bars in the bathroom? No    Shower chair or bench in shower? No    Elevated toilet seat or a handicapped toilet? No             TIMED UP AND GO:  Was the test performed?  No    Cognitive Function:        10/05/2022    9:36 AM 09/05/2021    1:50 PM  6CIT Screen  What  Year? 0 points 0 points  What month? 0 points 0 points  What time? 0 points 0 points  Count back from 20 0 points 0 points  Months in reverse 0 points 0 points  Repeat phrase 0 points 0 points  Total Score 0 points 0 points    Immunizations Immunization History  Administered Date(s) Administered   Fluad Quad(high Dose 65+) 01/21/2019, 03/15/2020, 01/23/2021   Influenza-Unspecified 03/06/2022   Moderna Sars-Covid-2 Vaccination 02/13/2020   PFIZER(Purple Top)SARS-COV-2 Vaccination 05/25/2019, 06/22/2019   Pneumococcal Conjugate-13 08/29/2020   Pneumococcal Polysaccharide-23 05/03/2010, 01/21/2019   Tdap 07/22/2019   Zoster, Live 04/09/2013    TDAP status: Up to date  Flu Vaccine status: Up to date  Pneumococcal vaccine status: Up to date  Covid-19 vaccine status: Information provided on how to obtain vaccines.   Qualifies for Shingles Vaccine? Yes   Zostavax completed No   Shingrix Completed?: No.    Education has been provided regarding the importance of this vaccine. Patient has been advised to call insurance company to determine out of pocket expense if they have not yet received this vaccine. Advised may also receive vaccine at local pharmacy or Health Dept. Verbalized acceptance and understanding.  Screening Tests Health Maintenance  Topic Date Due   Zoster Vaccines- Shingrix (1 of 2) Never done   Colonoscopy  12/18/2014   COVID-19 Vaccine (4 - 2023-24 season) 12/08/2021   Medicare Annual Wellness (AWV)  09/06/2022   MAMMOGRAM  10/13/2022   INFLUENZA VACCINE  11/08/2022   DTaP/Tdap/Td (2 - Td or Tdap) 07/21/2029   Pneumonia Vaccine 39+ Years old  Completed   DEXA SCAN  Completed   Hepatitis C Screening  Completed   HPV VACCINES  Aged Out    Health Maintenance  Health Maintenance Due  Topic Date Due   Zoster Vaccines- Shingrix (1 of 2) Never done   Colonoscopy  12/18/2014   COVID-19 Vaccine (4 - 2023-24 season) 12/08/2021   Medicare Annual Wellness (AWV)   09/06/2022    Colorectal cancer screening: No longer required.   Mammogram status: Ordered 10/05/22. Pt provided with contact info and advised to call to schedule appt.   Bone Density status: Ordered 10/05/22. Pt provided with contact info and advised to call to schedule appt.  Lung Cancer Screening: (Low Dose CT Chest recommended if Age 56-80 years, 20 pack-year currently smoking OR have quit w/in 15years.) does not qualify.   Lung Cancer Screening Referral: n/a  Additional Screening:  Hepatitis C Screening: does not qualify; Completed 07/23/2019  Vision Screening: Recommended annual ophthalmology exams for early detection of glaucoma and other disorders of the eye. Is the patient up to date with their annual eye exam?  Yes  Who is the provider or what is the name of the office in which the patient attends annual eye exams? [facility]  [place] If pt is not established with a provider, would they like to be referred to a provider to establish care? No .   Dental Screening: Recommended annual dental exams for proper oral hygiene  Diabetic Foot Exam: n/a  Community Resource Referral / Chronic Care Management: CRR required this visit?  No   CCM required this visit?  No     Plan:     I have personally reviewed and noted the following in the patient's chart:   Medical and social history Use of alcohol, tobacco or illicit drugs  Current medications and supplements including opioid prescriptions. Patient is not currently taking opioid prescriptions. Functional ability and status Nutritional status Physical activity Advanced directives List of other physicians Hospitalizations, surgeries, and ER visits in previous 12 months Vitals Screenings to include cognitive, depression, and falls Referrals and appointments  In addition, I have reviewed and discussed with patient certain preventive protocols, quality metrics, and best practice recommendations. A written personalized care  plan for preventive services as well as general preventive health recommendations were provided to patient.   Any medications not marked as taking were not mentioned by the patient (or their caregiver if applicable) when reconciling the medications.  Because this visit was a virtual/telehealth visit,  certain criteria was not obtained, such a blood pressure, CBG if patient is a diabetic, and timed up and go.    Jordan Hawks Jaydee Ingman, CMA   10/05/2022   After Visit Summary: (MyChart) Due to this being a telephonic visit, the after visit summary with patients personalized plan was offered to patient via MyChart   Nurse Notes: mammo and dexa ordered

## 2022-10-05 NOTE — Patient Instructions (Signed)
[name] [name] , Thank you for taking time to come for your Medicare Wellness Visit. I appreciate your ongoing commitment to your health goals. Please review the following plan we discussed and let me know if I can assist you in the future.   These are the goals we discussed:  Goals      Exercise 3x per week (30 min per time)     Continue to stay active and healthy.  Continue to go Rving.     Patient Stated     Maintain current level of health and activity     Weight (lb) < 145 lb (65.8 kg)        This is a list of the screening recommended for you and due dates:  Health Maintenance  Topic Date Due   Zoster (Shingles) Vaccine (1 of 2) Never done   Colon Cancer Screening  12/18/2014   COVID-19 Vaccine (4 - 2023-24 season) 12/08/2021   Mammogram  10/13/2022   Flu Shot  11/08/2022   Medicare Annual Wellness Visit  10/05/2023   DTaP/Tdap/Td vaccine (2 - Td or Tdap) 07/21/2029   Pneumonia Vaccine  Completed   DEXA scan (bone density measurement)  Completed   Hepatitis C Screening  Completed   HPV Vaccine  Aged Out    Advanced directives: Advance directive discussed with you today. Even though you declined this today, please call our office should you change your mind, and we can give you the proper paperwork for you to fill out. Advance care planning is a way to make decisions about medical care that fits your values in case you are ever unable to make these decisions for yourself.  Information on Advanced Care Planning can be found at [facility] Advance Health Care Directives Advance Health Care Directives ([email or web address]    Conditions/risks identified:  You have an order for:  []   2D Mammogram  [x]   3D Mammogram  [x]   Bone Density     Please call for appointment:   [facility] [employer] Mammo Bus [phone]  [facility]  [address] [place] [address] [phone]  [facility] [address] [place], [place] [address] [phone]  [place] [address] Geanie Logan [place], [place] [address] [phone]  [facility] [address] [place], [place] [address] [phone]  [facility] [address] [place], [place] [address] [phone]  [facility] [place] [address] [place], [place] [address] [phone]  [facility] [place] [facility] Density [address] [place], [place] [address] [phone]  [facility] [address] [place], [place] [address] [phone]  [facility] at [facility] [address] [place], [place] [address] [phone]  [facility] [address] [place], [place] [address] [phone]  [facility] [address] [place], [place] [address] [phone]  [facility] at [facility] [address] [place], [place] [address] [phone]  [facility] [place] [address] [place], [place] [address] [phone]  [facility] [address] [place], [place] [address] [phone] -DEXA [phone] -[facility]  [facility] at [facility] [address] [place] Ste -Radiology [place], [place] [address] [phone]  [facility] [address] [place], [place] [address] [phone]   Make sure to wear two-piece clothing.  No lotions powders or deodorants the day of the appointment Make sure to bring picture ID and insurance card.  Bring list of medications you are currently taking including any supplements.   Schedule your [place] screening mammogram through MyChart!   Log into your MyChart  account.  Go to 'Visit' (or 'Appointments' if on mobile App) --> Schedule an Appointment  Under 'Select a Reason for Visit' choose the Mammogram Screening option.  Complete the pre-visit questions and select the time and place that best fits your schedule.    Next appointment: VIRTUAL/TELEPHONE APPOINTMENT Follow up in one year for your annual wellness visit  October 18, 2023 at 9am telephone visit   Preventive Care 65 Years and Older, Female Preventive care refers to lifestyle choices and visits with your health care provider that can promote health and wellness. What does preventive care include? A yearly physical exam. This is also called an annual well check. Dental exams once or twice a year. Routine eye exams. Ask your health care provider how often you should have your eyes checked. Personal lifestyle choices, including: Daily care of your teeth and gums. Regular physical activity. Eating a healthy diet. Avoiding tobacco and drug use. Limiting alcohol use. Practicing safe sex. Taking low-dose aspirin every day. Taking vitamin and mineral supplements as recommended by your health care provider. What happens during an annual well check? The services and screenings done by your health care provider during your annual well check will depend on your age, overall health, lifestyle risk factors, and family history of disease. Counseling  Your health care provider may ask you questions about your: Alcohol use. Tobacco use. Drug use. Emotional well-being. Home and relationship well-being. Sexual activity. Eating habits. History of falls. Memory and ability to understand (cognition). Work and work Astronomer. Reproductive health. Screening  You may have the following tests or measurements: Height, weight, and BMI. Blood pressure. Lipid and cholesterol levels. These may be checked every 5 years, or more frequently if you are over 14 years old. Skin check. Lung cancer  screening. You may have this screening every year starting at age 47 if you have a 30-pack-year history of smoking and currently smoke or have quit within the past 15 years. Fecal occult blood test (FOBT) of the stool. You may have this test every year starting at age 27. Flexible sigmoidoscopy or colonoscopy. You may have a sigmoidoscopy every 5 years or a colonoscopy every 10 years starting at age 36. Hepatitis C blood test. Hepatitis B blood test. Sexually transmitted disease (STD) testing. Diabetes screening. This is done by checking your blood sugar (glucose) after you have not eaten for a while (fasting). You may have this done every 1-3 years. Bone density scan. This is done to screen for osteoporosis. You may have this done starting at age 67. Mammogram. This may be done every 1-2 years. Talk to your health care provider about how often you should have regular mammograms. Talk with your health care provider about your test results, treatment options, and if necessary, the need for more tests. Vaccines  Your health care provider may recommend certain vaccines, such as: Influenza vaccine. This is recommended every year. Tetanus, diphtheria, and acellular pertussis (Tdap, Td) vaccine. You may need a Td booster every 10 years. Zoster vaccine. You may need this after age 66. Pneumococcal 13-valent conjugate (PCV13) vaccine. One dose  is recommended after age 43. Pneumococcal polysaccharide (PPSV23) vaccine. One dose is recommended after age 65. Talk to your health care provider about which screenings and vaccines you need and how often you need them. This information is not intended to replace advice given to you by your health care provider. Make sure you discuss any questions you have with your health care provider. Document Released: 04/22/2015 Document Revised: 12/14/2015 Document Reviewed: 01/25/2015 Elsevier Interactive Patient Education  2017 [employer].  Fall Prevention in the  Home Falls can cause injuries. They can happen to people of all ages. There are many things you can do to make your home safe and to help prevent falls. What can I do on the outside of my home? Regularly fix the edges of walkways and driveways and fix any cracks. Remove anything that might make you trip as you walk through a door, such as a raised step or threshold. Trim any bushes or trees on the path to your home. Use bright outdoor lighting. Clear any walking paths of anything that might make someone trip, such as rocks or tools. Regularly check to see if handrails are loose or broken. Make sure that both sides of any steps have handrails. Any raised decks and porches should have guardrails on the edges. Have any leaves, snow, or ice cleared regularly. Use sand or salt on walking paths during winter. Clean up any spills in your garage right away. This includes oil or grease spills. What can I do in the bathroom? Use night lights. Install grab bars by the toilet and in the tub and shower. Do not use towel bars as grab bars. Use non-skid mats or decals in the tub or shower. If you need to sit down in the shower, use a plastic, non-slip stool. Keep the floor dry. Clean up any water that spills on the floor as soon as it happens. Remove soap buildup in the tub or shower regularly. Attach bath mats securely with double-sided non-slip rug tape. Do not have throw rugs and other things on the floor that can make you trip. What can I do in the bedroom? Use night lights. Make sure that you have a light by your bed that is easy to reach. Do not use any sheets or blankets that are too big for your bed. They should not hang down onto the floor. Have a firm chair that has side arms. You can use this for support while you get dressed. Do not have throw rugs and other things on the floor that can make you trip. What can I do in the kitchen? Clean up any spills right away. Avoid walking on wet  floors. Keep items that you use a lot in easy-to-reach places. If you need to reach something above you, use a strong step stool that has a grab bar. Keep electrical cords out of the way. Do not use floor polish or wax that makes floors slippery. If you must use wax, use non-skid floor wax. Do not have throw rugs and other things on the floor that can make you trip. What can I do with my stairs? Do not leave any items on the stairs. Make sure that there are handrails on both sides of the stairs and use them. Fix handrails that are broken or loose. Make sure that handrails are as long as the stairways. Check any carpeting to make sure that it is firmly attached to the stairs. Fix any carpet that is loose or worn. Avoid  having throw rugs at the top or bottom of the stairs. If you do have throw rugs, attach them to the floor with carpet tape. Make sure that you have a light switch at the top of the stairs and the bottom of the stairs. If you do not have them, ask someone to add them for you. What else can I do to help prevent falls? Wear shoes that: Do not have high heels. Have rubber bottoms. Are comfortable and fit you well. Are closed at the toe. Do not wear sandals. If you use a stepladder: Make sure that it is fully opened. Do not climb a closed stepladder. Make sure that both sides of the stepladder are locked into place. Ask someone to hold it for you, if possible. Clearly mark and make sure that you can see: Any grab bars or handrails. First and last steps. Where the edge of each step is. Use tools that help you move around (mobility aids) if they are needed. These include: Canes. Walkers. Scooters. Crutches. Turn on the lights when you go into a dark area. Replace any light bulbs as soon as they burn out. Set up your furniture so you have a clear path. Avoid moving your furniture around. If any of your floors are uneven, fix them. If there are any pets around you, be aware of  where they are. Review your medicines with your doctor. Some medicines can make you feel dizzy. This can increase your chance of falling. Ask your doctor what other things that you can do to help prevent falls. This information is not intended to replace advice given to you by your health care provider. Make sure you discuss any questions you have with your health care provider. Document Released: 01/20/2009 Document Revised: 09/01/2015 Document Reviewed: 04/30/2014 Elsevier Interactive Patient Education  2017 [employer].

## 2022-10-22 ENCOUNTER — Telehealth: Payer: Self-pay

## 2022-10-22 ENCOUNTER — Other Ambulatory Visit: Payer: Self-pay | Admitting: Family Medicine

## 2022-10-22 DIAGNOSIS — I10 Essential (primary) hypertension: Secondary | ICD-10-CM

## 2022-10-22 DIAGNOSIS — E039 Hypothyroidism, unspecified: Secondary | ICD-10-CM

## 2022-10-22 DIAGNOSIS — G2581 Restless legs syndrome: Secondary | ICD-10-CM

## 2022-10-22 MED ORDER — GABAPENTIN 300 MG PO CAPS: 600.0000 mg | ORAL_CAPSULE | Freq: Every day | ORAL | 1 refills | Status: DC

## 2022-10-22 MED ORDER — AMITRIPTYLINE HCL 75 MG PO TABS: 75.0000 mg | ORAL_TABLET | Freq: Every day | ORAL | 1 refills | Status: DC

## 2022-10-22 MED ORDER — AMLODIPINE BESYLATE 5 MG PO TABS: 5.0000 mg | ORAL_TABLET | Freq: Every day | ORAL | 1 refills | Status: DC

## 2022-10-22 MED ORDER — LEVOTHYROXINE SODIUM 88 MCG PO TABS: 88.0000 ug | ORAL_TABLET | Freq: Every day | ORAL | 2 refills | Status: DC

## 2022-10-22 NOTE — Telephone Encounter (Signed)
Pt wants medications transferred to local pharmacy  [facility] [place]  amitriptyline (ELAVIL) 75 MG tablet  amLODipine (NORVASC) 5 MG tablet gabapentin (NEURONTIN) 300 MG capsule  levothyroxine (SYNTHROID) 88 MCG tablet  ALPRAZolam (XANAX) 0.5 MG tablet   Pt no longer wants to use [employer] -[phone]

## 2022-10-23 ENCOUNTER — Other Ambulatory Visit: Payer: Self-pay | Admitting: Family Medicine

## 2022-10-23 DIAGNOSIS — G2581 Restless legs syndrome: Secondary | ICD-10-CM

## 2022-10-23 MED ORDER — ALPRAZOLAM 0.5 MG PO TABS: ORAL_TABLET | ORAL | 5 refills | Status: DC

## 2022-11-05 ENCOUNTER — Other Ambulatory Visit: Payer: Self-pay | Admitting: Family Medicine

## 2022-11-09 ENCOUNTER — Ambulatory Visit (INDEPENDENT_AMBULATORY_CARE_PROVIDER_SITE_OTHER): Payer: Medicare HMO | Admitting: Nurse Practitioner

## 2022-11-09 ENCOUNTER — Encounter: Payer: Self-pay | Admitting: Nurse Practitioner

## 2022-11-09 VITALS — BP 128/62 | HR 86 | Temp 97.8°F | Wt 136.4 lb

## 2022-11-09 DIAGNOSIS — R350 Frequency of micturition: Secondary | ICD-10-CM | POA: Diagnosis not present

## 2022-11-09 DIAGNOSIS — R3915 Urgency of urination: Secondary | ICD-10-CM

## 2022-11-09 DIAGNOSIS — R300 Dysuria: Secondary | ICD-10-CM | POA: Diagnosis not present

## 2022-11-09 LAB — POCT URINALYSIS DIP (CLINITEK)
Bilirubin, UA: NEGATIVE
Glucose, UA: NEGATIVE mg/dL
Ketones, POC UA: NEGATIVE mg/dL
Nitrite, UA: NEGATIVE
POC PROTEIN,UA: NEGATIVE
Spec Grav, UA: 1.01 (ref 1.010–1.025)
Urobilinogen, UA: 0.2 E.U./dL
pH, UA: 6 (ref 5.0–8.0)

## 2022-11-09 MED ORDER — NITROFURANTOIN MONOHYD MACRO 100 MG PO CAPS: 100.0000 mg | ORAL_CAPSULE | Freq: Two times a day (BID) | ORAL | 0 refills | Status: DC

## 2022-11-09 NOTE — Patient Instructions (Signed)
Urinary Tract Infection, Adult A urinary tract infection (UTI) is an infection of any part of the urinary tract. The urinary tract includes: The kidneys. The ureters. The bladder. The urethra. These organs make, store, and get rid of pee (urine) in the body. What are the causes? This infection is caused by germs (bacteria) in your genital area. These germs grow and cause swelling (inflammation) of your urinary tract. What increases the risk? The following factors may make you more likely to develop this condition: Using a small, thin tube (catheter) to drain pee. Not being able to control when you pee or poop (incontinence). Being female. If you are female, these things can increase the risk: Using these methods to prevent pregnancy: A medicine that kills sperm (spermicide). A device that blocks sperm (diaphragm). Having low levels of a female hormone (estrogen). Being pregnant. You are more likely to develop this condition if: You have genes that add to your risk. You are sexually active. You take antibiotic medicines. You have trouble peeing because of: A prostate that is bigger than normal, if you are female. A blockage in the part of your body that drains pee from the bladder. A kidney stone. A nerve condition that affects your bladder. Not getting enough to drink. Not peeing often enough. You have other conditions, such as: Diabetes. A weak disease-fighting system (immune system). Sickle cell disease. Gout. Injury of the spine. What are the signs or symptoms? Symptoms of this condition include: Needing to pee right away. Peeing small amounts often. Pain or burning when peeing. Blood in the pee. Pee that smells bad or not like normal. Trouble peeing. Pee that is cloudy. Fluid coming from the vagina, if you are female. Pain in the belly or lower back. Other symptoms include: Vomiting. Not feeling hungry. Feeling mixed up (confused). This may be the first symptom in  older adults. Being tired and grouchy (irritable). A fever. Watery poop (diarrhea). How is this treated? Taking antibiotic medicine. Taking other medicines. Drinking enough water. In some cases, you may need to see a specialist. Follow these instructions at home:  Medicines Take over-the-counter and prescription medicines only as told by your doctor. If you were prescribed an antibiotic medicine, take it as told by your doctor. Do not stop taking it even if you start to feel better. General instructions Make sure you: Pee until your bladder is empty. Do not hold pee for a long time. Empty your bladder after sex. Wipe from front to back after peeing or pooping if you are a female. Use each tissue one time when you wipe. Drink enough fluid to keep your pee pale yellow. Keep all follow-up visits. Contact a doctor if: You do not get better after 1-2 days. Your symptoms go away and then come back. Get help right away if: You have very bad back pain. You have very bad pain in your lower belly. You have a fever. You have chills. You feeling like you will vomit or you vomit. Summary A urinary tract infection (UTI) is an infection of any part of the urinary tract. This condition is caused by germs in your genital area. There are many risk factors for a UTI. Treatment includes antibiotic medicines. Drink enough fluid to keep your pee pale yellow. This information is not intended to replace advice given to you by your health care provider. Make sure you discuss any questions you have with your health care provider. Document Revised: 11/01/2019 Document Reviewed: 11/06/2019 Elsevier Patient Education    2024 Elsevier Inc.  

## 2022-11-09 NOTE — Progress Notes (Signed)
   Subjective:    Patient ID: [name] [name], female    DOB: 1945/12/19, 77 y.o.   MRN: [id number]  HPI Presents for complaints of urinary urgency and frequency.  Began a couple of days ago.  Denies any dysuria.  No fever.  No incontinence.  No back pain.  Remote history of recurrent UTIs but none over the past year.  States she is not drinking enough fluids due to a very busy lifestyle.  No nausea vomiting.  No pelvic pain.  Married, no new partners.  Not currently sexually active.   Review of Systems  Constitutional:  Negative for fever.  Respiratory:  Negative for cough, chest tightness and shortness of breath.   Cardiovascular:  Negative for chest pain.  Gastrointestinal:  Negative for abdominal pain.  Genitourinary:  Positive for frequency and urgency. Negative for dysuria, enuresis, flank pain and pelvic pain.       Objective:   Physical Exam NAD.  Alert, oriented.  Lungs clear.  Heart regular rate rhythm.  No CVA tenderness to percussion.  Abdomen soft nondistended nontender. Results for orders placed or performed in visit on 11/09/22  POCT URINALYSIS DIP (CLINITEK)  Result Value Ref Range   Color, UA yellow yellow   Clarity, UA clear clear   Glucose, UA negative negative mg/dL   Bilirubin, UA negative negative   Ketones, POC UA negative negative mg/dL   Spec Grav, UA 1.324 4.010 - 1.025   Blood, UA small (A) negative   pH, UA 6.0 5.0 - 8.0   POC PROTEIN,UA negative negative, trace   Urobilinogen, UA 0.2 0.2 or 1.0 E.U./dL   Nitrite, UA Negative Negative   Leukocytes, UA Small (1+) (A) Negative   Urine microscopic poor sample with some epithelials and occasional WBC.       Assessment & Plan:   Problem List Items Addressed This Visit   None Visit Diagnoses     Urinary urgency    -  Primary   Relevant Orders   POCT URINALYSIS DIP (CLINITEK) (Completed)   Urine Culture   Urinary frequency         Urine culture.  A clean-catch sample was obtained. Meds ordered  this encounter  Medications   nitrofurantoin, macrocrystal-monohydrate, (MACROBID) 100 MG capsule    Sig: Take 1 capsule (100 mg total) by mouth 2 (two) times daily.    Dispense:  10 capsule    Refill:  0    Order Specific Question:   Supervising Provider    Answer:   Lilyan Punt A [[id number]]   AZO as directed for urgency and frequency. Warning signs reviewed.  Further follow-up based on culture results, patient to be seen sooner if worsening symptoms. Otherwise follow-up with Dr. Adriana Simas next week.

## 2022-11-16 ENCOUNTER — Ambulatory Visit: Payer: Medicare HMO | Admitting: Nurse Practitioner

## 2022-11-22 ENCOUNTER — Ambulatory Visit: Payer: Medicare HMO | Admitting: Family Medicine

## 2022-12-05 ENCOUNTER — Ambulatory Visit (INDEPENDENT_AMBULATORY_CARE_PROVIDER_SITE_OTHER): Payer: Medicare HMO | Admitting: Family Medicine

## 2022-12-05 VITALS — BP 135/70 | HR 79 | Temp 97.7°F | Ht 59.0 in | Wt 136.6 lb

## 2022-12-05 DIAGNOSIS — I10 Essential (primary) hypertension: Secondary | ICD-10-CM

## 2022-12-05 DIAGNOSIS — E039 Hypothyroidism, unspecified: Secondary | ICD-10-CM | POA: Diagnosis not present

## 2022-12-05 DIAGNOSIS — R159 Full incontinence of feces: Secondary | ICD-10-CM | POA: Diagnosis not present

## 2022-12-05 DIAGNOSIS — J449 Chronic obstructive pulmonary disease, unspecified: Secondary | ICD-10-CM | POA: Diagnosis not present

## 2022-12-05 DIAGNOSIS — F99 Mental disorder, not otherwise specified: Secondary | ICD-10-CM | POA: Diagnosis not present

## 2022-12-05 DIAGNOSIS — R7303 Prediabetes: Secondary | ICD-10-CM

## 2022-12-05 DIAGNOSIS — F5105 Insomnia due to other mental disorder: Secondary | ICD-10-CM | POA: Diagnosis not present

## 2022-12-05 DIAGNOSIS — D72829 Elevated white blood cell count, unspecified: Secondary | ICD-10-CM | POA: Diagnosis not present

## 2022-12-05 DIAGNOSIS — E785 Hyperlipidemia, unspecified: Secondary | ICD-10-CM

## 2022-12-05 MED ORDER — ALPRAZOLAM 0.5 MG PO TABS: ORAL_TABLET | ORAL | 1 refills | Status: DC

## 2022-12-05 NOTE — Progress Notes (Signed)
Subjective:  Patient ID: [name] [name], female    DOB: 02/20/1946  Age: 77 y.o. MRN: [id number]  CC:  Follow up   HPI:  77 year old female presents for follow-up.  Hypertension is well-controlled on amlodipine.  Patient states that she needs a refill on her alprazolam.  COPD stable.  Patient reports that she has been experiencing bouts of difficulty having bowel movement.  She is also had multiple episodes where she is unaware and soils her pants.  She states that this has been going on for a couple of months.  No abdominal pain.  No reports of blood in the stool.  No other associated symptoms.  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      Social Hx   Social History   Socioeconomic History   Marital status: Married    Spouse name: Kipp Brood   Number of children: Not on file   Years of education: Not on file   Highest education level: High school graduate  Occupational History   Occupation: Retired  Tobacco Use   Smoking status: Former    Current packs/day: 0.00    Average packs/day: 1.5 packs/day for 35.0 years (52.5 ttl pk-yrs)    Types: Cigarettes    Start date: 04/09/1968    Quit date: 04/10/2003    Years since quitting: 19.6   Smokeless tobacco: Never  Vaping Use   Vaping status: Never Used  Substance and Sexual Activity   Alcohol use: No   Drug use: No   Sexual activity: Not Currently  Other Topics Concern   Not on file  Social History Narrative   Married 22 years in 2nd  marriage.First marriage lasted 20 years.Retired,now a Education officer, environmental.   Social Determinants of Health   Financial Resource Strain: Low Risk  (10/05/2022)   Overall Financial Resource Strain (CARDIA)    Difficulty of Paying Living Expenses: Not hard at all  Food Insecurity: No Food Insecurity (10/05/2022)   Hunger Vital Sign    Worried About Running Out of Food in the Last Year: Never true    Ran Out of Food in the Last Year: Never true  Transportation Needs: No Transportation Needs (10/05/2022)   PRAPARE - Administrator, Civil Service (Medical): No    Lack of Transportation (Non-Medical): No  Physical Activity: Sufficiently Active (10/05/2022)   Exercise Vital Sign    Days of Exercise per Week: 5 days    Minutes of Exercise per Session: 30 min  Stress: No Stress Concern Present (10/05/2022)   [employer] of Occupational Health - Occupational Stress Questionnaire    Feeling of Stress : Not at all  Social Connections: Moderately Integrated (10/05/2022)   Social Connection and Isolation Panel [NHANES]    Frequency of Communication with Friends and Family: More than three times a week    Frequency of Social Gatherings with Friends and Family: More than three times a week    Attends Religious Services: More than 4 times per year    Active  Member of Clubs or Organizations: No    Attends Engineer, structural: Never    Marital Status: Married    Review of Systems Per HPI  Objective:  BP 135/70   Pulse 79   Temp 97.7 F (36.5 C)   Ht 4\' 11"  (1.499 m)   Wt 136 lb 9.6 oz (62 kg)   SpO2 97%   BMI 27.59 kg/m      12/05/2022   10:08 AM 11/09/2022   10:23 AM 10/05/2022    9:35 AM  BP/Weight  Systolic BP 135 128 --  Diastolic BP 70 62 --  Wt. (Lbs) 136.6 136.4 138  BMI 27.59 kg/m2 27.55 kg/m2 27.87 kg/m2    Physical Exam Vitals and nursing note reviewed.  Constitutional:      General: She is not in acute distress.    Appearance: Normal appearance.  HENT:      Head: Normocephalic and atraumatic.  Eyes:     General:        Right eye: No discharge.        Left eye: No discharge.     Conjunctiva/sclera: Conjunctivae normal.  Cardiovascular:     Rate and Rhythm: Normal rate and regular rhythm.  Pulmonary:     Effort: Pulmonary effort is normal.     Breath sounds: Rales present.  Abdominal:     General: There is no distension.     Palpations: Abdomen is soft.     Tenderness: There is no abdominal tenderness.  Neurological:     Mental Status: She is alert.  Psychiatric:        Mood and Affect: Mood normal.        Behavior: Behavior normal.     Lab Results  Component Value Date   WBC 8.0 04/05/2022   HGB 12.8 04/05/2022   HCT 41.6 04/05/2022   PLT 305 04/05/2022   GLUCOSE 95 04/05/2022   CHOL 197 06/19/2021   TRIG 101 06/19/2021   HDL 56 06/19/2021   LDLCALC 123 (H) 06/19/2021   ALT 21 06/19/2021   AST 21 06/19/2021   NA 139 04/05/2022   K 4.5 04/05/2022   CL 106 04/05/2022   CREATININE 0.87 04/05/2022   BUN 16 04/05/2022   CO2 25 04/05/2022   TSH 0.753 11/20/2021   INR 0.90 08/14/2013   HGBA1C 5.7 (H) 04/05/2022     Assessment & Plan:   Problem List Items Addressed This Visit       Cardiovascular and Mediastinum   Essential hypertension, benign - Primary    Stable.  Continue amlodipine.      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        Endocrine   Hypothyroidism    Thyroid labs to assess.      Relevant Orders   TSH + free T4     Other   Prediabetes   Relevant Orders   Hemoglobin A1c   Hyperlipidemia   Relevant Orders   Lipid panel   Fecal incontinence    I suspect that this is related to pelvic floor dysfunction.  I have reached out to our referral coordinator to see how we can arrange for her to see physical therapist that does pelvic floor (via urology).      Insomnia    Alprazolam refilled.      Other Visit Diagnoses     Leukocytosis,  unspecified type  Relevant Orders   CBC       Meds ordered this encounter  Medications   ALPRAZolam (XANAX) 0.5 MG tablet    Sig: TAKE 1 TABLET AT BEDTIME AS NEEDED FOR ANXIETY    Dispense:  90 tablet    Refill:  1    Follow-up:  6 months  Jla Reynolds Adriana Simas DO [facility]

## 2022-12-05 NOTE — Assessment & Plan Note (Signed)
Alprazolam refilled.  

## 2022-12-05 NOTE — Assessment & Plan Note (Signed)
Stable. °-Continue amlodipine °

## 2022-12-05 NOTE — Assessment & Plan Note (Signed)
Thyroid labs to assess.

## 2022-12-05 NOTE — Patient Instructions (Signed)
Labs today.  Continue your medications.  I will work on the pelvic PT.  Follow up in 6 months

## 2022-12-05 NOTE — Assessment & Plan Note (Signed)
Stable

## 2022-12-05 NOTE — Assessment & Plan Note (Signed)
I suspect that this is related to pelvic floor dysfunction.  I have reached out to our referral coordinator to see how we can arrange for her to see physical therapist that does pelvic floor (via urology).

## 2022-12-06 ENCOUNTER — Other Ambulatory Visit: Payer: Self-pay | Admitting: Family Medicine

## 2022-12-06 DIAGNOSIS — R159 Full incontinence of feces: Secondary | ICD-10-CM

## 2022-12-06 LAB — CMP14+EGFR
ALT: 23 IU/L (ref 0–32)
AST: 24 IU/L (ref 0–40)
Albumin: 4.5 g/dL (ref 3.8–4.8)
Alkaline Phosphatase: 212 IU/L — ABNORMAL HIGH (ref 44–121)
BUN/Creatinine Ratio: 20 (ref 12–28)
BUN: 15 mg/dL (ref 8–27)
Bilirubin Total: 0.3 mg/dL (ref 0.0–1.2)
CO2: 24 mmol/L (ref 20–29)
Calcium: 11 mg/dL — ABNORMAL HIGH (ref 8.7–10.3)
Chloride: 102 mmol/L (ref 96–106)
Creatinine, Ser: 0.74 mg/dL (ref 0.57–1.00)
Globulin, Total: 2.2 g/dL (ref 1.5–4.5)
Glucose: 84 mg/dL (ref 70–99)
Potassium: 5.1 mmol/L (ref 3.5–5.2)
Sodium: 141 mmol/L (ref 134–144)
Total Protein: 6.7 g/dL (ref 6.0–8.5)
eGFR: 85 mL/min/{1.73_m2} (ref >59)

## 2022-12-06 LAB — CBC
Hematocrit: 42.1 % (ref 34.0–46.6)
Hemoglobin: 13.2 g/dL (ref 11.1–15.9)
MCH: 25.8 pg — ABNORMAL LOW (ref 26.6–33.0)
MCHC: 31.4 g/dL — ABNORMAL LOW (ref 31.5–35.7)
MCV: 82 fL (ref 79–97)
Platelets: 321 10*3/uL (ref 150–450)
RBC: 5.11 x10E6/uL (ref 3.77–5.28)
RDW: 14.2 % (ref 11.7–15.4)
WBC: 7 10*3/uL (ref 3.4–10.8)

## 2022-12-06 LAB — LIPID PANEL
Chol/HDL Ratio: 3.3 ratio (ref 0.0–4.4)
Cholesterol, Total: 153 mg/dL (ref 100–199)
HDL: 46 mg/dL (ref >39)
LDL Chol Calc (NIH): 89 mg/dL (ref 0–99)
Triglycerides: 100 mg/dL (ref 0–149)
VLDL Cholesterol Cal: 18 mg/dL (ref 5–40)

## 2022-12-06 LAB — HEMOGLOBIN A1C
Est. average glucose Bld gHb Est-mCnc: 111 mg/dL
Hgb A1c MFr Bld: 5.5 % (ref 4.8–5.6)

## 2022-12-06 LAB — TSH+FREE T4
Free T4: 1.42 ng/dL (ref 0.82–1.77)
TSH: 0.321 u[IU]/mL — ABNORMAL LOW (ref 0.450–4.500)

## 2022-12-07 ENCOUNTER — Other Ambulatory Visit: Payer: Self-pay | Admitting: Family Medicine

## 2022-12-07 DIAGNOSIS — E039 Hypothyroidism, unspecified: Secondary | ICD-10-CM

## 2022-12-07 DIAGNOSIS — R748 Abnormal levels of other serum enzymes: Secondary | ICD-10-CM

## 2022-12-07 MED ORDER — LEVOTHYROXINE SODIUM 75 MCG PO TABS
75.0000 ug | ORAL_TABLET | Freq: Every day | ORAL | 1 refills | Status: DC
Start: 2022-12-07 — End: 2023-01-24

## 2022-12-12 ENCOUNTER — Telehealth: Payer: Self-pay

## 2022-12-12 ENCOUNTER — Other Ambulatory Visit: Payer: Self-pay | Admitting: Nurse Practitioner

## 2022-12-12 NOTE — Telephone Encounter (Signed)
Already sent in by Dr. Adriana Simas on 8/30

## 2022-12-12 NOTE — Telephone Encounter (Signed)
Prescription Request  12/12/2022  LOV: Visit date not found  What is the name of the medication or equipment? levothyroxine (SYNTHROID) 75 MCG tablet   Have you contacted your pharmacy to request a refill? Yes   Which pharmacy would you like this sent to? [facility]    Patient notified that their request is being sent to the clinical staff for review and that they should receive a response within 2 business days.   Please advise at Mobile [phone] (mobile)

## 2022-12-14 DIAGNOSIS — R748 Abnormal levels of other serum enzymes: Secondary | ICD-10-CM | POA: Diagnosis not present

## 2022-12-15 ENCOUNTER — Other Ambulatory Visit: Payer: Self-pay | Admitting: Family Medicine

## 2022-12-15 DIAGNOSIS — I10 Essential (primary) hypertension: Secondary | ICD-10-CM

## 2022-12-17 LAB — PTH, INTACT AND CALCIUM
Calcium: 10.7 mg/dL — ABNORMAL HIGH (ref 8.7–10.3)
PTH: 55 pg/mL (ref 15–65)

## 2022-12-17 LAB — GAMMA GT: GGT: 15 IU/L (ref 0–60)

## 2022-12-17 LAB — ALKALINE PHOSPHATASE, ISOENZYMES
Alkaline Phosphatase: 207 IU/L — ABNORMAL HIGH (ref 44–121)
BONE FRACTION: 52 % (ref 14–68)
INTESTINAL FRAC.: 2 % (ref 0–18)
LIVER FRACTION: 46 % (ref 18–85)

## 2023-01-14 ENCOUNTER — Other Ambulatory Visit: Payer: Self-pay | Admitting: Family Medicine

## 2023-01-14 DIAGNOSIS — G2581 Restless legs syndrome: Secondary | ICD-10-CM

## 2023-01-24 ENCOUNTER — Telehealth: Payer: Self-pay

## 2023-01-24 DIAGNOSIS — E039 Hypothyroidism, unspecified: Secondary | ICD-10-CM

## 2023-01-24 MED ORDER — LEVOTHYROXINE SODIUM 75 MCG PO TABS: 75.0000 ug | ORAL_TABLET | Freq: Every day | ORAL | 1 refills | Status: DC

## 2023-01-24 NOTE — Telephone Encounter (Signed)
Prescription Request  01/24/2023  LOV: Visit date not found  What is the name of the medication or equipment? levothyroxine (SYNTHROID) 75 MCG tablet   Have you contacted your pharmacy to request a refill? Yes   Which pharmacy would you like this sent to?   [facility] [place]   Patient notified that their request is being sent to the clinical staff for review and that they should receive a response within 2 business days.   Please advise at Mobile [phone] (mobile)

## 2023-01-25 ENCOUNTER — Telehealth: Payer: Self-pay | Admitting: Family Medicine

## 2023-01-25 DIAGNOSIS — E039 Hypothyroidism, unspecified: Secondary | ICD-10-CM

## 2023-01-25 MED ORDER — LEVOTHYROXINE SODIUM 75 MCG PO TABS: 75.0000 ug | ORAL_TABLET | Freq: Every day | ORAL | 1 refills | Status: DC

## 2023-01-25 NOTE — Telephone Encounter (Signed)
Refill on  levothyroxine (SYNTHROID) 75 MCG tablet  send to [facility]

## 2023-02-05 ENCOUNTER — Ambulatory Visit: Payer: Medicare HMO

## 2023-02-05 DIAGNOSIS — Z23 Encounter for immunization: Secondary | ICD-10-CM | POA: Diagnosis not present

## 2023-02-08 ENCOUNTER — Ambulatory Visit
Admission: EM | Admit: 2023-02-08 | Discharge: 2023-02-08 | Disposition: A | Discharge status: Home / Self Care | Payer: Medicare HMO | Attending: Family Medicine | Admitting: Family Medicine

## 2023-02-08 ENCOUNTER — Encounter: Payer: Self-pay | Admitting: Emergency Medicine

## 2023-02-08 DIAGNOSIS — N390 Urinary tract infection, site not specified: Secondary | ICD-10-CM | POA: Diagnosis not present

## 2023-02-08 LAB — POCT URINALYSIS DIP (MANUAL ENTRY)
Bilirubin, UA: NEGATIVE
Glucose, UA: NEGATIVE mg/dL
Ketones, POC UA: NEGATIVE mg/dL
Nitrite, UA: POSITIVE — AB
Spec Grav, UA: 1.025 (ref 1.010–1.025)
Urobilinogen, UA: 1 U/dL
pH, UA: 5.5 (ref 5.0–8.0)

## 2023-02-08 MED ORDER — CEPHALEXIN 500 MG PO CAPS: 500.0000 mg | ORAL_CAPSULE | Freq: Two times a day (BID) | ORAL | 0 refills | Status: DC

## 2023-02-08 NOTE — ED Provider Notes (Signed)
[facility]    CSN: [id number] Arrival date & time: 02/08/23  1202      History   Chief Complaint No chief complaint on file.   HPI [name] [name] is a 77 y.o. female.   Presenting today with urinary urgency and dysuria for 2 days.  Denies fever, chills, nausea, vomiting, vaginal symptoms.  So far not trying anything over-the-counter for symptoms.  History of recurrent UTIs.    Past Medical History:  Diagnosis Date   COPD (chronic obstructive pulmonary disease) (HCC)    Depression    Dyspnea    Essential hypertension, benign 01/21/2019   History of pneumonia    2010 on ventalator   History of recurrent UTIs    Hyperlipidemia    Hypothyroidism    Insomnia 12/18/2018   Left knee DJD    Osteoporosis    Pneumonia    Pre-diabetes    PTSD (post-traumatic stress disorder)    Restless leg syndrome    Sleep apnea     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      Past Surgical History:  Procedure Laterality Date   ABDOMINAL HYSTERECTOMY     still had both ovaries   COLONOSCOPY  12/13/2004   Dr. Gabriel Cirri; Normal exam   KNEE ARTHROSCOPY WITH MEDIAL MENISECTOMY Left 08/24/2013   Procedure: LEFT KNEE ARTHROSCOPY WITH MEDIAL MENISCECTOMY  ;  Surgeon: Nilda Simmer, MD;  Location: [facility] OR;  Service: Orthopedics;  Laterality: Left;   PARTIAL KNEE ARTHROPLASTY Left 08/24/2013   Procedure: LEFT KNEE ARTHROPLASTY UNI COMPARTMENTAL ;  Surgeon: Nilda Simmer, MD;  Location: [facility] OR;   Service: Orthopedics;  Laterality: Left;  Left knee arthroplasty uni compartment  With left knee scope medial meniscectomy     PARTIAL KNEE ARTHROPLASTY Right 04/16/2022   Procedure: UNICOMPARTMENTAL KNEE;  Surgeon: Joen Laura, MD;  Location: [facility] ORS;  Service: Orthopedics;  Laterality: Right;   REVERSE SHOULDER ARTHROPLASTY Left 02/14/2021   Procedure: LEFT REVERSE SHOULDER ARTHROPLASTY;  Surgeon: Cammy Copa, MD;  Location: [facility] OR;  Service: Orthopedics;  Laterality: Left;    OB History   No obstetric history on file.      Home Medications    Prior to Admission medications   Medication Sig Start Date End Date Taking? Authorizing Provider  cephALEXin (KEFLEX) 500 MG capsule Take 1 capsule (500 mg total) by mouth 2 (two) times daily. 02/08/23  Yes Particia Nearing, PA-C  ALPRAZolam Prudy Feeler) 0.5 MG tablet TAKE 1 TABLET AT BEDTIME AS NEEDED FOR ANXIETY 12/05/22   Tommie Sams, DO  amitriptyline (ELAVIL) 75 MG tablet TAKE 1 TABLET EVERY DAY 12/19/22   Everlene Other G, DO  amLODipine (NORVASC) 5 MG tablet TAKE 1 TABLET EVERY DAY 12/19/22   Tommie Sams, DO  gabapentin (NEURONTIN) 300 MG capsule TAKE 2 CAPSULES AT BEDTIME 01/14/23   Tommie Sams, DO  levothyroxine (SYNTHROID) 75 MCG tablet Take 1 tablet (75 mcg total) by mouth daily before breakfast. 01/25/23   Adriana Simas,  Verdis Frederickson, DO    Family History Family History  Problem Relation Age of Onset   Stroke Mother    Heart disease Father    Heart attack Father    Hypertension Father    Heart attack Brother    Emphysema Daughter    Emphysema Son    Hypertension Son    Colon cancer Neg Hx    Colon polyps Neg Hx     Social History Social History   Tobacco Use   Smoking status: Former    Current packs/day: 0.00    Average packs/day: 1.5 packs/day for 35.0 years (52.5 ttl pk-yrs)    Types: Cigarettes    Start date: 04/09/1968    Quit date: 04/10/2003    Years since quitting: 19.8   Smokeless tobacco: Never  Vaping Use    Vaping status: Never Used  Substance Use Topics   Alcohol use: No   Drug use: No     Allergies   Codeine   Review of Systems Review of Systems Per HPI  Physical Exam Triage Vital Signs ED Triage Vitals  Encounter Vitals Group     BP 02/08/23 1210 (!) 145/66     Systolic BP Percentile --      Diastolic BP Percentile --      Pulse Rate 02/08/23 1210 82     Resp 02/08/23 1210 18     Temp 02/08/23 1210 97.6 F (36.4 C)     Temp Source 02/08/23 1210 Oral     SpO2 02/08/23 1210 97 %     Weight --      Height --      Head Circumference --      Peak Flow --      Pain Score 02/08/23 1214 0     Pain Loc --      Pain Education --      Exclude from Growth Chart --    No data found.  Updated Vital Signs BP (!) 145/66 (BP Location: Right Arm)   Pulse 82   Temp 97.6 F (36.4 C) (Oral)   Resp 18   SpO2 97%   Visual Acuity Right Eye Distance:   Left Eye Distance:   Bilateral Distance:    Right Eye Near:   Left Eye Near:    Bilateral Near:     Physical Exam Vitals and nursing note reviewed.  Constitutional:      Appearance: Normal appearance. She is not ill-appearing.  HENT:     Head: Atraumatic.  Eyes:     Extraocular Movements: Extraocular movements intact.     Conjunctiva/sclera: Conjunctivae normal.  Cardiovascular:     Rate and Rhythm: Normal rate and regular rhythm.     Heart sounds: Normal heart sounds.  Pulmonary:     Effort: Pulmonary effort is normal.     Breath sounds: Normal breath sounds.  Abdominal:     General: Bowel sounds are normal. There is no distension.     Palpations: Abdomen is soft.     Tenderness: There is no abdominal tenderness. There is no right CVA tenderness, left CVA tenderness or guarding.  Musculoskeletal:        General: Normal range of motion.     Cervical back: Normal range of motion and neck supple.  Skin:    General: Skin is warm and dry.  Neurological:     Mental Status: She is alert and oriented to person,  place, and time.  Psychiatric:  Mood and Affect: Mood normal.        Thought Content: Thought content normal.        Judgment: Judgment normal.      UC Treatments / Results  Labs (all labs ordered are listed, but only abnormal results are displayed) Labs Reviewed  POCT URINALYSIS DIP (MANUAL ENTRY) - Abnormal; Notable for the following components:      Result Value   Clarity, UA cloudy (*)    Blood, UA moderate (*)    Protein Ur, POC trace (*)    Nitrite, UA Positive (*)    Leukocytes, UA Small (1+) (*)    All other components within normal limits  URINE CULTURE    EKG   Radiology No results found.  Procedures Procedures (including critical care time)  Medications Ordered in UC Medications - No data to display  Initial Impression / Assessment and Plan / UC Course  I have reviewed the triage vital signs and the nursing notes.  Pertinent labs & imaging results that were available during my care of the patient were reviewed by me and considered in my medical decision making (see chart for details).     Vital signs and exam reassuring today, urinalysis with evidence of a urinary tract infection.  Treat with Keflex, await urine culture and adjust if needed.  Discussed supportive home care and return precautions.  Final Clinical Impressions(s) / UC Diagnoses   Final diagnoses:  Acute lower UTI   Discharge Instructions   None    ED Prescriptions     Medication Sig Dispense Auth. Provider   cephALEXin (KEFLEX) 500 MG capsule Take 1 capsule (500 mg total) by mouth 2 (two) times daily. 10 capsule Particia Nearing, [place]      PDMP not reviewed this encounter.   Sian, [place], [place] 02/08/23 1352

## 2023-02-08 NOTE — ED Triage Notes (Signed)
Urinary urgency and burning on urination x 2 days.

## 2023-02-10 LAB — URINE CULTURE: Culture: >=100000 — AB

## 2023-02-12 DIAGNOSIS — R151 Fecal smearing: Secondary | ICD-10-CM | POA: Diagnosis not present

## 2023-02-12 DIAGNOSIS — M6281 Muscle weakness (generalized): Secondary | ICD-10-CM | POA: Diagnosis not present

## 2023-02-12 DIAGNOSIS — R150 Incomplete defecation: Secondary | ICD-10-CM | POA: Diagnosis not present

## 2023-02-12 DIAGNOSIS — M6289 Other specified disorders of muscle: Secondary | ICD-10-CM | POA: Diagnosis not present

## 2023-02-12 DIAGNOSIS — K5900 Constipation, unspecified: Secondary | ICD-10-CM | POA: Diagnosis not present

## 2023-02-12 DIAGNOSIS — N3946 Mixed incontinence: Secondary | ICD-10-CM | POA: Diagnosis not present

## 2023-03-20 DIAGNOSIS — G43109 Migraine with aura, not intractable, without status migrainosus: Secondary | ICD-10-CM | POA: Diagnosis not present

## 2023-03-20 DIAGNOSIS — H16143 Punctate keratitis, bilateral: Secondary | ICD-10-CM | POA: Diagnosis not present

## 2023-03-20 DIAGNOSIS — H25813 Combined forms of age-related cataract, bilateral: Secondary | ICD-10-CM | POA: Diagnosis not present

## 2023-03-20 DIAGNOSIS — H43813 Vitreous degeneration, bilateral: Secondary | ICD-10-CM | POA: Diagnosis not present

## 2023-03-22 IMAGING — DX DG SHOULDER 1V*L*
1 series · 1 of 1 positions shown · non-contrast
Comparison: 11/08/2020

CLINICAL DATA: Left shoulder arthroplasty

EXAM:
LEFT SHOULDER

[shoulder]
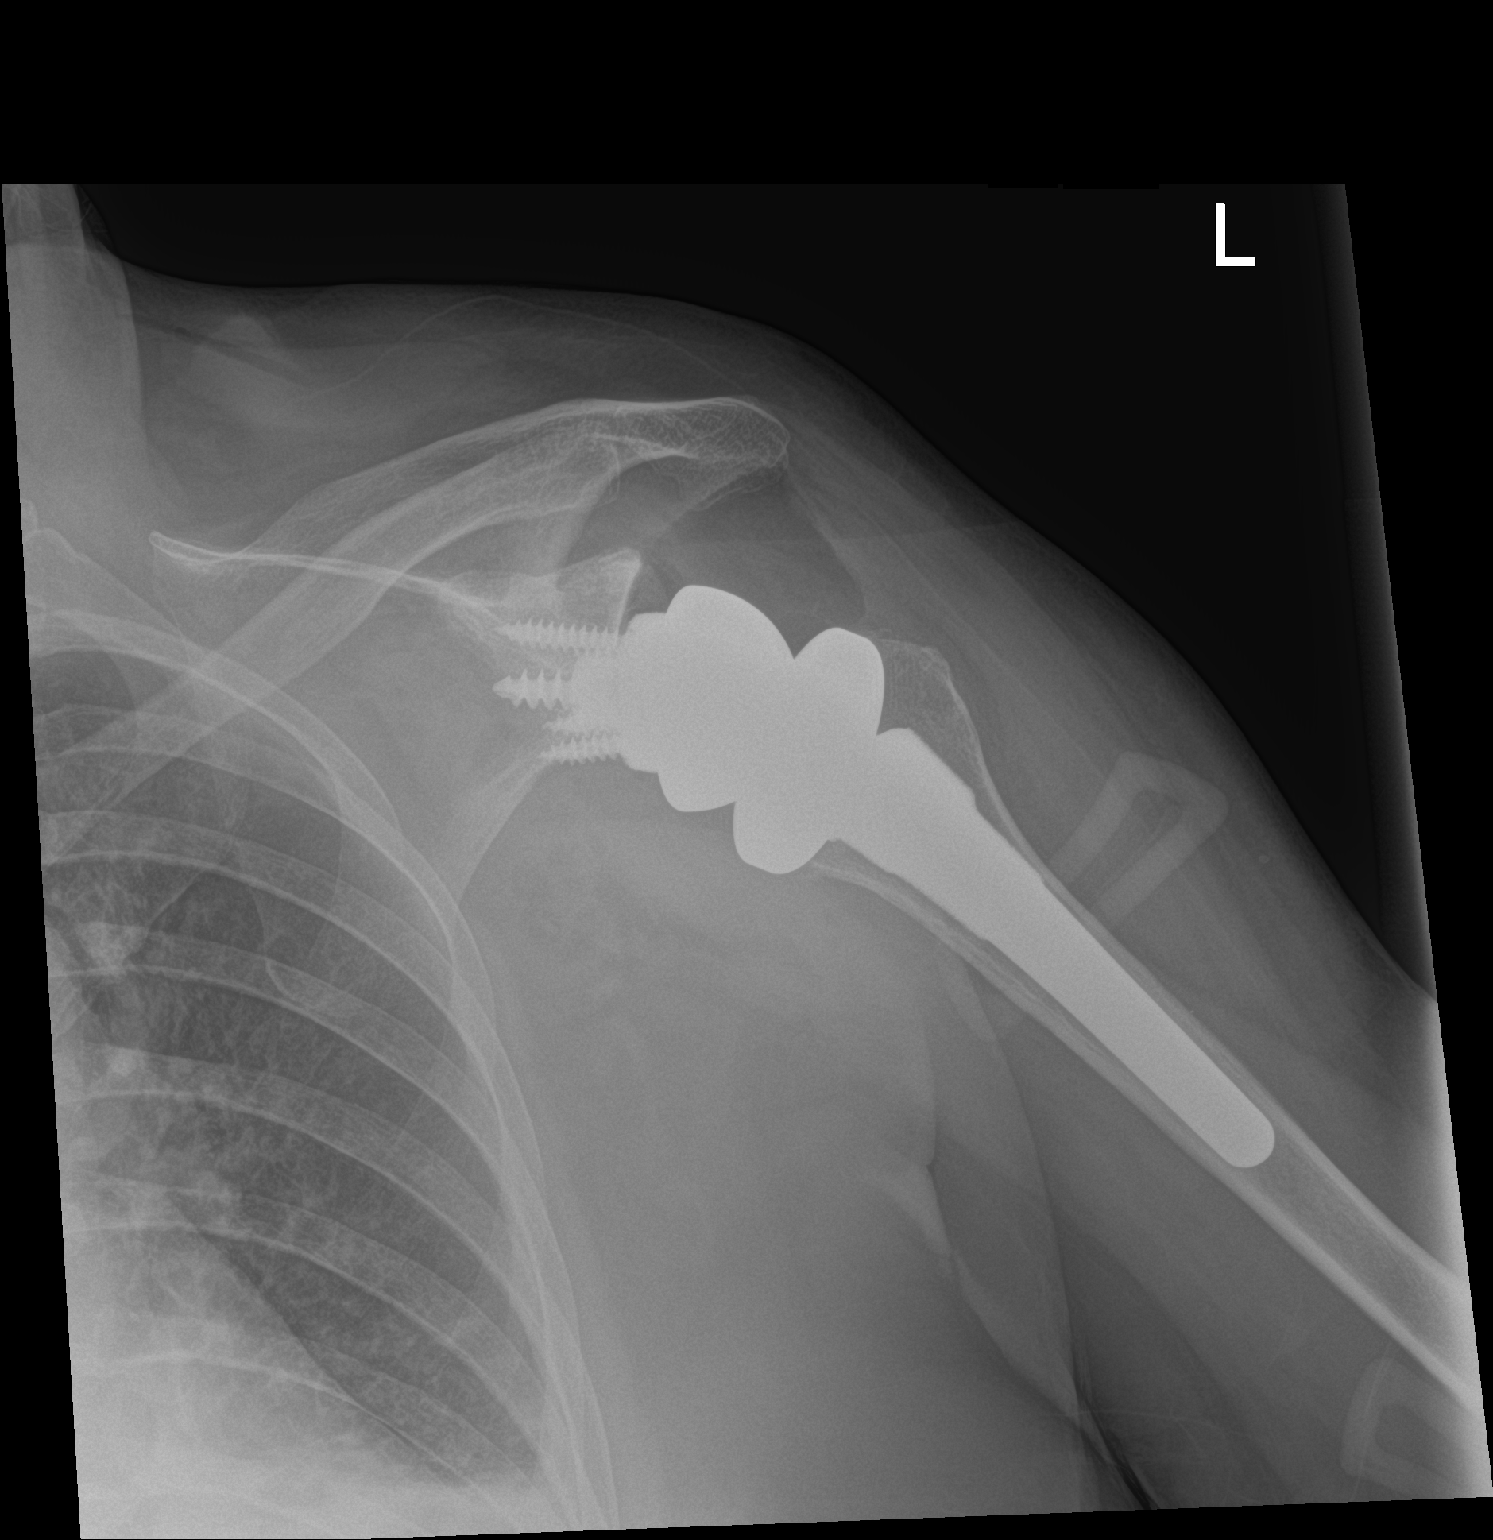

[1 of 1 positions shown; findings below may reference images not displayed]

FINDINGS: There is interval left shoulder arthroplasty. Severe degenerative
changes were noted in the left shoulder in the previous study.
IMPRESSION: Interval left shoulder arthroplasty.

## 2023-03-26 ENCOUNTER — Ambulatory Visit (HOSPITAL_COMMUNITY)
Admission: RE | Admit: 2023-03-26 | Discharge: 2023-03-26 | Disposition: A | Discharge status: Home / Self Care | Payer: Medicare HMO | Source: Ambulatory Visit | Attending: Family Medicine | Admitting: Family Medicine

## 2023-03-26 ENCOUNTER — Ambulatory Visit: Payer: Medicare HMO | Admitting: Family Medicine

## 2023-03-26 VITALS — BP 128/60 | HR 90 | Temp 97.7°F | Ht 59.0 in | Wt 141.0 lb

## 2023-03-26 DIAGNOSIS — Z96612 Presence of left artificial shoulder joint: Secondary | ICD-10-CM | POA: Diagnosis not present

## 2023-03-26 DIAGNOSIS — J441 Chronic obstructive pulmonary disease with (acute) exacerbation: Secondary | ICD-10-CM | POA: Diagnosis not present

## 2023-03-26 DIAGNOSIS — R0602 Shortness of breath: Secondary | ICD-10-CM | POA: Diagnosis not present

## 2023-03-26 DIAGNOSIS — I700 Atherosclerosis of aorta: Secondary | ICD-10-CM | POA: Diagnosis not present

## 2023-03-26 DIAGNOSIS — R059 Cough, unspecified: Secondary | ICD-10-CM | POA: Diagnosis not present

## 2023-03-26 DIAGNOSIS — R051 Acute cough: Secondary | ICD-10-CM | POA: Insufficient documentation

## 2023-03-26 MED ORDER — DOXYCYCLINE HYCLATE 100 MG PO TABS: 100.0000 mg | ORAL_TABLET | Freq: Two times a day (BID) | ORAL | 0 refills | Status: DC

## 2023-03-26 MED ORDER — ALBUTEROL SULFATE HFA 108 (90 BASE) MCG/ACT IN AERS: 2.0000 | INHALATION_SPRAY | Freq: Four times a day (QID) | RESPIRATORY_TRACT | 0 refills | Status: DC | PRN

## 2023-03-26 MED ORDER — PREDNISONE 50 MG PO TABS: 50.0000 mg | ORAL_TABLET | Freq: Every day | ORAL | 0 refills | Status: AC

## 2023-03-26 NOTE — Assessment & Plan Note (Signed)
Treating with Prednisone, albuterol, and Doxy. Chest xray with no evidence of infiltrate.

## 2023-03-26 NOTE — Progress Notes (Signed)
Subjective:  Patient ID: [name] [name], female    DOB: [birth year]  Age: 77 y.o. MRN: [id number]  CC:   Chief Complaint  Patient presents with   Cough    Crooupy, hacking cough for 4 days - body aches, headaches    HPI:  77 year old female with a history of COPD presents for evaluation of the above.  4 day history of symptoms - non productive cough, mild increase SOB, body ache, headache. No documented fever. No relieving factors. No known exacerbating factors. No other complaints.  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      Social Hx   Social History   Socioeconomic History   Marital status: Married    Spouse name: Kipp Brood   Number of children: Not on file   Years of education: Not on file   Highest education level: High school graduate  Occupational History   Occupation: Retired  Tobacco Use   Smoking status: Former    Current packs/day: 0.00    Average packs/day: 1.5 packs/day for 35.0 years (52.5 ttl pk-yrs)    Types: Cigarettes    Start date: 04/09/1968    Quit date: 04/10/2003    Years since quitting: 19.9   Smokeless tobacco: Never  Vaping Use   Vaping status: Never Used  Substance and Sexual Activity   Alcohol use: No   Drug use: No   Sexual activity: Not Currently  Other Topics Concern   Not on file  Social History Narrative   Married 22 years in 2nd marriage.First marriage lasted 20 years.Retired,now a Education officer, environmental.   Social Drivers of  Corporate investment banker Strain: Low Risk  (10/05/2022)   Overall Financial Resource Strain (CARDIA)    Difficulty of Paying Living Expenses: Not hard at all  Food Insecurity: No Food Insecurity (10/05/2022)   Hunger Vital Sign    Worried About Running Out of Food in the Last Year: Never true    Ran Out of Food in the Last Year: Never true  Transportation Needs: No Transportation Needs (10/05/2022)   PRAPARE - Administrator, Civil Service (Medical): No    Lack of Transportation (Non-Medical): No  Physical Activity: Sufficiently Active (10/05/2022)   Exercise Vital Sign    Days of Exercise per Week: 5 days    Minutes of Exercise per Session: 30 min  Stress: No Stress Concern Present (10/05/2022)   [employer] of Occupational Health - Occupational Stress Questionnaire    Feeling of Stress : Not at all  Social Connections: Moderately Integrated (10/05/2022)   Social Connection and Isolation Panel [NHANES]    Frequency of Communication with Friends and Family: More than three times a week    Frequency of Social Gatherings with Friends and Family: More than three times a week    Attends Religious Services: More than 4 times per year    Active Member of [employer] or Organizations: No    Attends Banker Meetings: Never  Marital Status: Married    Review of Systems Per HPI  Objective:  BP 128/60   Pulse 90   Temp 97.7 F (36.5 C)   Ht 4\' 11"  (1.499 m)   Wt 141 lb (64 kg)   SpO2 94%   BMI 28.48 kg/m      03/26/2023    3:46 PM 02/08/2023   12:10 PM 12/05/2022   10:08 AM  BP/Weight  Systolic BP 128 145 135  Diastolic BP 60 66 70  Wt. (Lbs) 141  136.6  BMI 28.48 kg/m2  27.59 kg/m2    Physical Exam Vitals and nursing note reviewed.  Constitutional:      General: She is not in acute distress. HENT:     Head: Normocephalic and atraumatic.     Mouth/Throat:     Pharynx: Posterior oropharyngeal erythema present.  Cardiovascular:     Rate and  Rhythm: Normal rate and regular rhythm.  Pulmonary:     Effort: Pulmonary effort is normal.     Breath sounds: Wheezing present.  Neurological:     Mental Status: She is alert.     Lab Results  Component Value Date   WBC 7.0 12/05/2022   HGB 13.2 12/05/2022   HCT 42.1 12/05/2022   PLT 321 12/05/2022   GLUCOSE 84 12/05/2022   CHOL 153 12/05/2022   TRIG 100 12/05/2022   HDL 46 12/05/2022   LDLCALC 89 12/05/2022   ALT 23 12/05/2022   AST 24 12/05/2022   NA 141 12/05/2022   K 5.1 12/05/2022   CL 102 12/05/2022   CREATININE 0.74 12/05/2022   BUN 15 12/05/2022   CO2 24 12/05/2022   TSH 0.321 (L) 12/05/2022   INR 0.90 08/14/2013   HGBA1C 5.5 12/05/2022     Assessment & Plan:   Problem List Items Addressed This Visit       Respiratory   COPD exacerbation (HCC) - Primary   Treating with Prednisone, albuterol, and Doxy. Chest xray with no evidence of infiltrate.      Relevant Medications   predniSONE (DELTASONE) 50 MG tablet   albuterol (VENTOLIN HFA) 108 (90 Base) MCG/ACT inhaler   doxycycline (VIBRA-TABS) 100 MG tablet   Other Relevant Orders   DG Chest 2 View (Completed)    Meds ordered this encounter  Medications   predniSONE (DELTASONE) 50 MG tablet    Sig: Take 1 tablet (50 mg total) by mouth daily for 5 days.    Dispense:  5 tablet    Refill:  0   albuterol (VENTOLIN HFA) 108 (90 Base) MCG/ACT inhaler    Sig: Inhale 2 puffs into the lungs every 6 (six) hours as needed for wheezing or shortness of breath.    Dispense:  8 g    Refill:  0   doxycycline (VIBRA-TABS) 100 MG tablet    Sig: Take 1 tablet (100 mg total) by mouth 2 (two) times daily.    Dispense:  14 tablet    Refill:  0    Follow-up:  Return if symptoms worsen or fail to improve.  Everlene Other DO [facility]

## 2023-03-26 NOTE — Patient Instructions (Signed)
Chest x-ray and then antibiotics will be prescribed accordingly.  I went ahead and sent in corticosteroids as well as albuterol.

## 2023-04-04 DIAGNOSIS — H16143 Punctate keratitis, bilateral: Secondary | ICD-10-CM | POA: Diagnosis not present

## 2023-04-15 ENCOUNTER — Other Ambulatory Visit: Payer: Self-pay | Admitting: Family Medicine

## 2023-04-15 DIAGNOSIS — G2581 Restless legs syndrome: Secondary | ICD-10-CM

## 2023-04-15 MED ORDER — GABAPENTIN 300 MG PO CAPS: 600.0000 mg | ORAL_CAPSULE | Freq: Every day | ORAL | 3 refills | Status: DC

## 2023-04-15 NOTE — Telephone Encounter (Signed)
 Copied from CRM [id number]. Topic: Clinical - Medication Refill >> Apr 15, 2023  3:52 PM Antonio DEL wrote: Most Recent Primary Care Visit:  Provider: BLUFORD JACQULYN MATSU  Department: [facility]  Visit Type: OFFICE VISIT  Date: 03/26/2023  Medication: gabapentin  (NEURONTIN ) 300 MG  Has the patient contacted their pharmacy? No (Agent: If no, request that the patient contact the pharmacy for the refill. If patient does not wish to contact the pharmacy document the reason why and proceed with request.) (Agent: If yes, when and what did the pharmacy advise?)  Is this the correct pharmacy for this prescription? Yes If no, delete pharmacy and type the correct one.  This is the patient's preferred pharmacy:  [facility] [address] [place] - [address] [place] [address] [place] [address] Phone: [phone] Fax: [phone]     Has the prescription been filled recently?   Is the patient out of the medication?   Has the patient been seen for an appointment in the last year OR does the patient have an upcoming appointment?   Can we respond through MyChart?   Agent: Please be advised that Rx refills may take up to 3 business days. We ask that you follow-up with your pharmacy.

## 2023-04-25 DIAGNOSIS — H16143 Punctate keratitis, bilateral: Secondary | ICD-10-CM | POA: Diagnosis not present

## 2023-05-03 DIAGNOSIS — H16143 Punctate keratitis, bilateral: Secondary | ICD-10-CM | POA: Diagnosis not present

## 2023-05-20 DIAGNOSIS — Z0100 Encounter for examination of eyes and vision without abnormal findings: Secondary | ICD-10-CM | POA: Diagnosis not present

## 2023-06-05 ENCOUNTER — Other Ambulatory Visit: Payer: Self-pay | Admitting: Internal Medicine

## 2023-06-06 ENCOUNTER — Other Ambulatory Visit: Payer: Self-pay | Admitting: Family Medicine

## 2023-06-06 DIAGNOSIS — G2581 Restless legs syndrome: Secondary | ICD-10-CM

## 2023-06-06 MED ORDER — GABAPENTIN 300 MG PO CAPS
600.0000 mg | ORAL_CAPSULE | Freq: Every day | ORAL | 3 refills | Status: DC
Start: 2023-06-06 — End: 2023-11-25

## 2023-06-06 NOTE — Telephone Encounter (Signed)
 Copied from CRM [id number]. Topic: Clinical - Medication Refill >> Jun 06, 2023  4:15 PM Ivette P wrote: Most Recent Primary Care Visit:  Provider: Tommie Sams  Department: [facility]  Visit Type: OFFICE VISIT  Date: 03/26/2023  Medication: gabapentin (NEURONTIN) 300 MG capsule  Has the patient contacted their pharmacy? Yes (Agent: If no, request that the patient contact the pharmacy for the refill. If patient does not wish to contact the pharmacy document the reason why and proceed with request.) (Agent: If yes, when and what did the pharmacy advise?)  Is this the correct pharmacy for this prescription? Yes If no, delete pharmacy and type the correct one.  This is the patient's preferred pharmacy:  [facility] [address] [place] - [address] [place] [address] [place] [address] Phone: [phone] Fax: [phone]   Has the prescription been filled recently? No  Is the patient out of the medication? Yes, pt ran out 02/26  Has the patient been seen for an appointment in the last year OR does the patient have an upcoming appointment? Yes  Can we respond through MyChart? Yes  Agent: Please be advised that Rx refills may take up to 3 business days. We ask that you follow-up with your pharmacy.

## 2023-07-10 ENCOUNTER — Other Ambulatory Visit: Payer: Self-pay | Admitting: Family Medicine

## 2023-07-10 DIAGNOSIS — E039 Hypothyroidism, unspecified: Secondary | ICD-10-CM

## 2023-08-22 ENCOUNTER — Other Ambulatory Visit: Payer: Self-pay | Admitting: Family Medicine

## 2023-08-22 NOTE — Telephone Encounter (Unsigned)
 Copied from CRM [id number]. Topic: Clinical - Medication Refill >> Aug 22, 2023  5:26 PM Sophia H wrote: Medication: ALPRAZolam  (XANAX ) 0.5 MG tablet   Has the patient contacted their pharmacy? Yes (Agent: If no, request that the patient contact the pharmacy for the refill. If patient does not wish to contact the pharmacy document the reason why and proceed with request.) (Agent: If yes, when and what did the pharmacy advise?)  This is the patient's preferred pharmacy:  [facility] [address] [place] - [address] [place] [address] Phone: [phone] Fax: [phone]    Is this the correct pharmacy for this prescription? Yes If no, delete pharmacy and type the correct one.   Has the prescription been filled recently? No  Is the patient out of the medication? Yes  Has the patient been seen for an appointment in the last year OR does the patient have an upcoming appointment? Yes  Can we respond through MyChart? Yes  Agent: Please be advised that Rx refills may take up to 3 business days. We ask that you follow-up with your pharmacy.

## 2023-08-22 NOTE — Telephone Encounter (Signed)
 Last Fill: 12/05/22  Last OV: 03/26/23 Next OV: 10/18/23 AWV  Routing to provider for review/authorization.

## 2023-09-16 ENCOUNTER — Other Ambulatory Visit: Payer: Self-pay | Admitting: Family Medicine

## 2023-10-09 ENCOUNTER — Other Ambulatory Visit: Payer: Self-pay | Admitting: Family Medicine

## 2023-10-09 DIAGNOSIS — I10 Essential (primary) hypertension: Secondary | ICD-10-CM

## 2023-10-14 ENCOUNTER — Telehealth: Payer: Self-pay

## 2023-10-14 NOTE — Telephone Encounter (Signed)
 Please advise.   Copied from CRM [id number]. Topic: Clinical - Request for Lab/Test Order >> Oct 14, 2023 11:27 AM Delon DASEN wrote: Reason for CRM: patient has not had labs done in a year, please call [phone]

## 2023-10-18 ENCOUNTER — Ambulatory Visit (INDEPENDENT_AMBULATORY_CARE_PROVIDER_SITE_OTHER): Payer: Medicare HMO

## 2023-10-18 ENCOUNTER — Encounter (HOSPITAL_COMMUNITY): Payer: Self-pay | Admitting: Family Medicine

## 2023-10-18 VITALS — Ht 59.0 in | Wt 141.0 lb

## 2023-10-18 DIAGNOSIS — Z1231 Encounter for screening mammogram for malignant neoplasm of breast: Secondary | ICD-10-CM

## 2023-10-18 DIAGNOSIS — Z0000 Encounter for general adult medical examination without abnormal findings: Secondary | ICD-10-CM

## 2023-10-18 NOTE — Progress Notes (Signed)
 Subjective:   [name] [name] is a 78 y.o. who presents for a Medicare Wellness preventive visit.  As a reminder, Annual Wellness Visits don't include a physical exam, and some assessments may be limited, especially if this visit is performed virtually. We may recommend an in-person follow-up visit with your provider if needed.  Visit Complete: Virtual I connected with  [name] [name] on 10/18/23 by a audio enabled telemedicine application and verified that I am speaking with the correct person using two identifiers.  Patient Location: Home  Provider Location: Home Office  I discussed the limitations of evaluation and management by telemedicine. The patient expressed understanding and agreed to proceed.  Vital Signs: Because this visit was a virtual/telehealth visit, some criteria may be missing or patient reported. Any vitals not documented were not able to be obtained and vitals that have been documented are patient reported.  VideoDeclined- This patient declined Librarian, academic. Therefore the visit was completed with audio only.  Persons Participating in Visit: Patient.  AWV Questionnaire: No: Patient Medicare AWV questionnaire was not completed prior to this visit.  Cardiac Risk Factors include: advanced age (>21men, >83 women);hypertension     Objective:    Today's Vitals   10/18/23 0849  Weight: 141 lb (64 kg)  Height: 4' 11 (1.499 m)   Body mass index is 28.48 kg/m.     10/18/2023    9:09 AM 10/05/2022    9:29 AM 04/16/2022    6:08 AM 04/05/2022    9:34 AM 09/05/2021    1:46 PM 02/10/2021   11:04 AM 08/29/2020   10:52 AM  Advanced Directives  Does Patient Have a Medical Advance Directive? No No No No Yes No No  Type of Agricultural consultant;Living will    Copy of Healthcare Power of Attorney in Chart?     Yes - validated most recent copy scanned in chart (See row information)    Would patient like  information on creating a medical advance directive? Yes (MAU/Ambulatory/Procedural Areas - Information given) No - Patient declined No - Patient declined Yes (MAU/Ambulatory/Procedural Areas - Information given)  No - Patient declined No - Patient declined    Current Medications (verified) Outpatient Encounter Medications as of 10/18/2023  Medication Sig   albuterol  (VENTOLIN  HFA) 108 (90 Base) MCG/ACT inhaler Inhale 2 puffs into the lungs every 6 (six) hours as needed for wheezing or shortness of breath.   ALPRAZolam  (XANAX ) 0.5 MG tablet TAKE 1 TABLET AT BEDTIME AS NEEDED FOR ANXIETY   amitriptyline  (ELAVIL ) 75 MG tablet TAKE 1 TABLET EVERY DAY   amLODipine  (NORVASC ) 5 MG tablet TAKE 1 TABLET EVERY DAY   gabapentin  (NEURONTIN ) 300 MG capsule Take 2 capsules (600 mg total) by mouth at bedtime.   levothyroxine  (SYNTHROID ) 75 MCG tablet TAKE 1 TABLET EVERY DAY BEFORE BREAKFAST   doxycycline  (VIBRA -TABS) 100 MG tablet Take 1 tablet (100 mg total) by mouth 2 (two) times daily. (Patient not taking: Reported on 10/18/2023)   No facility-administered encounter medications on file as of 10/18/2023.    Allergies (verified) Codeine   History: Past Medical History:  Diagnosis Date   COPD (chronic obstructive pulmonary disease) (HCC)    Depression    Dyspnea    Essential hypertension, benign 01/21/2019   History of pneumonia    2010 on ventalator   History of recurrent UTIs    Hyperlipidemia    Hypothyroidism    Insomnia 12/18/2018  Left knee DJD    Osteoporosis    Pneumonia    Pre-diabetes    PTSD (post-traumatic stress disorder)    Restless leg syndrome    Sleep apnea    Past Surgical History:  Procedure Laterality Date   ABDOMINAL HYSTERECTOMY     still had both ovaries   COLONOSCOPY  12/13/2004   Dr. DeMason; Normal exam   KNEE ARTHROSCOPY WITH MEDIAL MENISECTOMY Left 08/24/2013   Procedure: LEFT KNEE ARTHROSCOPY WITH MEDIAL MENISCECTOMY  ;  Surgeon: Lamar DELENA Millman, MD;   Location: [facility];  Service: Orthopedics;  Laterality: Left;   PARTIAL KNEE ARTHROPLASTY Left 08/24/2013   Procedure: LEFT KNEE ARTHROPLASTY UNI COMPARTMENTAL ;  Surgeon: Lamar DELENA Millman, MD;  Location: [facility] OR;  Service: Orthopedics;  Laterality: Left;  Left knee arthroplasty uni compartment  With left knee scope medial meniscectomy     PARTIAL KNEE ARTHROPLASTY Right 04/16/2022   Procedure: UNICOMPARTMENTAL KNEE;  Surgeon: Edna Toribio DELENA, MD;  Location: [facility] ORS;  Service: Orthopedics;  Laterality: Right;   REVERSE SHOULDER ARTHROPLASTY Left 02/14/2021   Procedure: LEFT REVERSE SHOULDER ARTHROPLASTY;  Surgeon: Addie Cordella Hamilton, MD;  Location: [facility] OR;  Service: Orthopedics;  Laterality: Left;   Family History  Problem Relation Age of Onset   Stroke Mother    Heart disease Father    Heart attack Father    Hypertension Father    Heart attack Brother    Emphysema Daughter    Emphysema Son    Hypertension Son    Colon cancer Neg Hx    Colon polyps Neg Hx    Social History   Socioeconomic History   Marital status: Married    Spouse name: Thresa   Number of children: Not on file   Years of education: Not on file   Highest education level: High school graduate  Occupational History   Occupation: Retired  Tobacco Use   Smoking status: Former    Current packs/day: 0.00    Average packs/day: 1.5 packs/day for 35.0 years (52.5 ttl pk-yrs)    Types: Cigarettes    Start date: 04/09/1968    Quit date: 04/10/2003    Years since quitting: 20.5   Smokeless tobacco: Never  Vaping Use   Vaping status: Never Used  Substance and Sexual Activity   Alcohol  use: No   Drug use: No   Sexual activity: Not Currently  Other Topics Concern   Not on file  Social History Narrative   Married 22 years in 2nd marriage.First marriage lasted 20 years.Retired,now a Education officer, environmental.   Social Drivers of Corporate investment banker Strain: Low Risk  (10/18/2023)   Overall Financial Resource Strain (CARDIA)     Difficulty of Paying Living Expenses: Not hard at all  Food Insecurity: No Food Insecurity (10/18/2023)   Hunger Vital Sign    Worried About Running Out of Food in the Last Year: Never true    Ran Out of Food in the Last Year: Never true  Transportation Needs: No Transportation Needs (10/18/2023)   PRAPARE - Administrator, Civil Service (Medical): No    Lack of Transportation (Non-Medical): No  Physical Activity: Insufficiently Active (10/18/2023)   Exercise Vital Sign    Days of Exercise per Week: 3 days    Minutes of Exercise per Session: 30 min  Stress: No Stress Concern Present (10/18/2023)   [name] [name] of Occupational Health - Occupational Stress Questionnaire    Feeling of Stress: Not at all  Social Connections: Moderately Integrated (10/18/2023)   Social Connection and Isolation Panel    Frequency of Communication with Friends and Family: More than three times a week    Frequency of Social Gatherings with Friends and Family: More than three times a week    Attends Religious Services: More than 4 times per year    Active Member of [employer] or Organizations: No    Attends Banker Meetings: Never    Marital Status: Married    Tobacco Counseling Counseling given: Not Answered    Clinical Intake:  Pre-visit preparation completed: Yes  Pain : No/denies pain     Diabetes: No  Lab Results  Component Value Date   HGBA1C 5.5 12/05/2022   HGBA1C 5.7 (H) 04/05/2022   HGBA1C 6.1 (H) 06/19/2021     How often [name] [name] you need to have someone help you when you read instructions, pamphlets, or other written materials from your doctor or pharmacy?: 1 - Never  Interpreter Needed?: No  Information entered by :: [name] [name]   Activities of Daily Living     10/18/2023    9:09 AM  In your present state of health, [name] [name] you have any difficulty performing the following activities:  Hearing? 0  Vision? 0  Difficulty concentrating or making decisions? 0   Walking or climbing stairs? 0  Dressing or bathing? 0  Doing errands, shopping? 0  Preparing Food and eating ? N  Using the Toilet? N  In the past six months, have you accidently leaked urine? N  [name] [name] you have problems with loss of bowel control? N  Managing your Medications? N  Managing your Finances? N  Housekeeping or managing your Housekeeping? N    Patient Care Team: Cook, [name] [name], [name] [name] as PCP - General (Family Medicine) [name] Carlin POUR, [name] [name] as Consulting Physician (Gastroenterology)  I have updated your Care Teams any recent Medical Services you may have received from other providers in the past year.     Assessment:   This is a routine wellness examination for [name] [name].  Hearing/Vision screen Hearing Screening - Comments:: Denies hearing difficulties   Vision Screening - Comments:: up to date with routine eye exams with [name] [name]    Goals Addressed             This Visit's Progress    Patient Stated   On track    Maintain current level of health and activity       Depression Screen     10/18/2023    9:08 AM 12/05/2022   10:18 AM 11/09/2022   10:28 AM 10/05/2022    9:32 AM 05/23/2022   12:00 PM 02/20/2022   10:51 AM 09/05/2021    1:42 PM  PHQ 2/9 Scores  PHQ - 2 Score 0 0 0 0 0 1 0  PHQ- 9 Score  0 0  0      Fall Risk     10/18/2023    9:09 AM 12/05/2022   10:17 AM 11/09/2022   10:28 AM 10/05/2022    9:31 AM 05/23/2022   11:59 AM  Fall Risk   Falls in the past year? 0 1 1 0 1  Number falls in past yr: 0 1 0 0 1  Injury with Fall? 0 0 0 0 0  Risk for fall due to : No Fall Risks   No Fall Risks History of fall(s)  Follow up Falls prevention discussed;Education provided;Falls evaluation completed   Falls prevention discussed Falls  evaluation completed    MEDICARE RISK AT HOME:  Medicare Risk at Home Any stairs in or around the home?: Yes If so, are there any without handrails?: No Home free of loose throw rugs in walkways, pet beds, electrical cords,  etc?: Yes Adequate lighting in your home to reduce risk of falls?: Yes Life alert?: No Use of a cane, walker or w/c?: No Grab bars in the bathroom?: Yes Shower chair or bench in shower?: No Elevated toilet seat or a handicapped toilet?: Yes  TIMED UP AND GO:  Was the test performed?  No  Cognitive Function: 6CIT completed        10/18/2023    9:09 AM 10/05/2022    9:36 AM 09/05/2021    1:50 PM  6CIT Screen  What Year? 0 points 0 points 0 points  What month? 0 points 0 points 0 points  What time? 0 points 0 points 0 points  Count back from 20 0 points 0 points 0 points  Months in reverse 0 points 0 points 0 points  Repeat phrase 0 points 0 points 0 points  Total Score 0 points 0 points 0 points    Immunizations Immunization History  Administered Date(s) Administered   Fluad Quad(high Dose 65+) 01/21/2019, 03/15/2020, 01/23/2021   Fluad Trivalent(High Dose 65+) 02/05/2023   Influenza-Unspecified 03/06/2022   Moderna Sars-Covid-2 Vaccination 02/13/2020   PFIZER(Purple Top)SARS-COV-2 Vaccination 05/25/2019, 06/22/2019   Pneumococcal Conjugate-13 08/29/2020   Pneumococcal Polysaccharide-23 05/03/2010, 01/21/2019   Tdap 07/22/2019   Zoster, Live 04/09/2013    Screening Tests Health Maintenance  Topic Date Due   Zoster Vaccines- Shingrix (1 of 2) 12/29/1966   MAMMOGRAM  10/13/2022   Colonoscopy  11/09/2023 (Originally 12/18/2014)   COVID-19 Vaccine (4 - 2024-25 season) 04/01/2024 (Originally 12/09/2022)   INFLUENZA VACCINE  11/08/2023   Medicare Annual Wellness (AWV)  10/17/2024   DTaP/Tdap/Td (2 - Td or Tdap) 07/21/2029   Pneumococcal Vaccine: 50+ Years  Completed   DEXA SCAN  Completed   Hepatitis C Screening  Completed   Hepatitis B Vaccines  Aged Out   HPV VACCINES  Aged Out   Meningococcal B Vaccine  Aged Out    Health Maintenance  Health Maintenance Due  Topic Date Due   Zoster Vaccines- Shingrix (1 of 2) 12/29/1966   MAMMOGRAM  10/13/2022   Health  Maintenance Items Addressed: Mammogram ordered; declines dexa at this time; information provided on Shingrix   Additional Screening:  Vision Screening: Recommended annual ophthalmology exams for early detection of glaucoma and other disorders of the eye. Would you like a referral to an eye doctor? No    Dental Screening: Recommended annual dental exams for proper oral hygiene  Community Resource Referral / Chronic Care Management: CRR required this visit?  No   CCM required this visit?  No   Plan:    I have personally reviewed and noted the following in the patient's chart:   Medical and social history Use of alcohol , tobacco or illicit drugs  Current medications and supplements including opioid prescriptions. Patient is not currently taking opioid prescriptions. Functional ability and status Nutritional status Physical activity Advanced directives List of other physicians Hospitalizations, surgeries, and ER visits in previous 12 months Vitals Screenings to include cognitive, depression, and falls Referrals and appointments  In addition, I have reviewed and discussed with patient certain preventive protocols, quality metrics, and best practice recommendations. A written personalized care plan for preventive services as well as general preventive health recommendations were provided  to patient.   Lavelle Pfeiffer [place], [place]   2/88/7974   After Visit Summary: (MyChart) Due to this being a telephonic visit, the after visit summary with patients personalized plan was offered to patient via MyChart   Notes: Nothing significant to report at this time.

## 2023-10-18 NOTE — Patient Instructions (Signed)
 Ms. [name] [name] , Thank you for taking time out of your busy schedule to complete your Annual Wellness Visit with me. I enjoyed our conversation and look forward to speaking with you again next year. I, as well as your care team,  appreciate your ongoing commitment to your health goals. Please review the following plan we discussed and let me know if I can assist you in the future. Your Game plan/ To Do List    Follow up Visits: Next Medicare AWV with our clinical staff: In 1 year    Have you seen your provider in the last 6 months (3 months if uncontrolled diabetes)? No Next Office Visit with your provider: To be scheduled   Clinician Recommendations:  Aim for 30 minutes of exercise or brisk walking, 6-8 glasses of water , and 5 servings of fruits and vegetables each day.       This is a list of the screening recommended for you and due dates:  Health Maintenance  Topic Date Due   Zoster (Shingles) Vaccine (1 of 2) 12/29/1966   Mammogram  10/13/2022   Colon Cancer Screening  11/09/2023*   COVID-19 Vaccine (4 - 2024-25 season) 04/01/2024*   Flu Shot  11/08/2023   Medicare Annual Wellness Visit  10/17/2024   DTaP/Tdap/Td vaccine (2 - Td or Tdap) 07/21/2029   Pneumococcal Vaccine for age over 38  Completed   DEXA scan (bone density measurement)  Completed   Hepatitis C Screening  Completed   Hepatitis B Vaccine  Aged Out   HPV Vaccine  Aged Out   Meningitis B Vaccine  Aged Out  *Topic was postponed. The date shown is not the original due date.    Advanced directives: (ACP Link)Information on Advanced Care Planning can be found at [place]  Secretary of [facility] Advance Health Care Directives Advance Health Care Directives. [email or web address]   Advance Care Planning is important because it:  [x]  Makes sure you receive the medical care that is consistent with your values, goals, and preferences  [x]  It provides guidance to your family and loved ones and reduces their decisional burden about  whether or not they are making the right decisions based on your wishes.  Follow the link provided in your after visit summary or read over the paperwork we have mailed to you to help you started getting your Advance Directives in place. If you need assistance in completing these, please reach out to us  so that we can help you!  See attachments for Preventive Care and Fall Prevention Tips.

## 2023-10-25 DIAGNOSIS — L255 Unspecified contact dermatitis due to plants, except food: Secondary | ICD-10-CM | POA: Diagnosis not present

## 2023-11-11 ENCOUNTER — Ambulatory Visit: Payer: Self-pay | Admitting: *Deleted

## 2023-11-11 ENCOUNTER — Ambulatory Visit: Admitting: Physician Assistant

## 2023-11-11 NOTE — Telephone Encounter (Signed)
 Patient's husband called to reschedule her appt. Scheduled next day visit.  Copied from CRM [id number]. Topic: Clinical - Pink Word Triage >> Nov 11, 2023  1:04 PM Cleave MATSU wrote: Reason for Triage: pt have a rash and she think its scabies rash >> Nov 11, 2023  3:32 PM Elle L wrote: The patient's Husband states that the patient missed her appointment due to running late and per Team Lead as she was scheduled by a Nurse she needs to be rescheduled by a Nurse.  >> Nov 11, 2023  1:05 PM Cleave G wrote: pt have a rash and she think its scabies rash

## 2023-11-11 NOTE — Telephone Encounter (Signed)
 Message from [place] G sent at 11/11/2023  1:05 PM EDT  pt have a rash and she think its scabies rash    Call History  Contact Date/Time Type Contact Phone/Fax By  11/11/2023 01:03 PM EDT Phone (Incoming) [facility] (Self) [phone] Grace, Janeecia R

## 2023-11-11 NOTE — Telephone Encounter (Signed)
 Appt scheduled

## 2023-11-11 NOTE — Telephone Encounter (Signed)
 FYI Only or Action Required?: FYI only for provider.  Patient was last seen in primary care on 03/26/2023 by Cook, Jayce G, DO.  Called Nurse Triage reporting Rash.  Symptoms began a week ago.  Interventions attempted: OTC medications: cortisone cream.  Symptoms are: gradually worsening.  Triage Disposition: See Physician Within 24 Hours  Patient/caregiver understands and will follow disposition?: Yes  Message from Janeecia G sent at 11/11/2023  1:05 PM EDT  pt have a rash and she think its scabies rash   Reason for Disposition  SEVERE itching (i.e., interferes with sleep, normal activities or school)  Answer Assessment - Initial Assessment Questions 1. APPEARANCE of RASH: What does the rash look like? (e.g., blisters, dry flaky skin, red spots, redness, sores)     Red, rough, the size of the head of a pen  3. LOCATION: Where is the rash located?     Back, sides, legs, arms, back of neck 4. COLOR: What color is the rash? (Note: It is difficult to assess rash color in people with darker-colored skin. When this situation occurs, simply ask the caller to describe what they see.)     red 5. ONSET: When did the rash begin?     About a week ago 6. FEVER: Do you have a fever? If Yes, ask: What is your temperature, how was it measured, and when did it start?     denies 7. ITCHING: Does the rash itch? If Yes, ask: How bad is the itch? (Scale 1-10; or mild, moderate, severe)     10/10 8. CAUSE: What do you think is causing the rash?     Thinks it is scabies, never had rash like this.  Not sure what it is 9. MEDICINE FACTORS: Have you started any new medicines within the last 2 weeks? (e.g., antibiotics)      Recent antibiotic use, but rash started before this 10. OTHER SYMPTOMS: Do you have any other symptoms? (e.g., dizziness, headache, sore throat, joint pain)       Skin on legs feels tight and scaly 11. PREGNANCY: Is there any chance you are pregnant? When  was your last menstrual period?       N/A   No one else in house with this rash.  Patient has used witch hazel and cortisone cream without relief  Protocols used: Rash or Redness - [facility]

## 2023-11-11 NOTE — Telephone Encounter (Signed)
 First attempt to return her call.   Left a voicemail to call back.   Returned to triage queue for future attempts.

## 2023-11-12 ENCOUNTER — Encounter: Payer: Self-pay | Admitting: Nurse Practitioner

## 2023-11-12 ENCOUNTER — Ambulatory Visit (INDEPENDENT_AMBULATORY_CARE_PROVIDER_SITE_OTHER): Admitting: Nurse Practitioner

## 2023-11-12 VITALS — BP 134/64 | HR 86 | Temp 96.8°F | Ht 59.0 in | Wt 142.0 lb

## 2023-11-12 DIAGNOSIS — R21 Rash and other nonspecific skin eruption: Secondary | ICD-10-CM

## 2023-11-12 MED ORDER — TRIAMCINOLONE ACETONIDE 0.1 % EX CREA: 1.0000 | TOPICAL_CREAM | Freq: Two times a day (BID) | CUTANEOUS | 0 refills | Status: DC

## 2023-11-12 MED ORDER — PREDNISONE 20 MG PO TABS: ORAL_TABLET | ORAL | 0 refills | Status: DC

## 2023-11-12 NOTE — Patient Instructions (Addendum)
 Allegra in the morning Benadryl  at night Pepcid  twice a day Cerave lotion or cream

## 2023-11-12 NOTE — Progress Notes (Signed)
   Subjective:    Patient ID: [name] [name], female    DOB: 02-Jul-1945, 78 y.o.   MRN: [id number]   HPI: Presents for complaints of a rash that began about a month ago.  Noticed after she did some weed eating in a ditch.  Started off on the lower legs and then spread.  Was noted all over her back but this has improved.  Some areas have started to fade while others are starting up.  Very pruritic.  Has noticed some slight fluid-filled bumps on some areas.  No fever.  No known contacts.  Some lesions on the back of the neck, none on the face and minimally on the arms.  The majority of lesions now are around the waist area anterior trunk and lower legs.  Has tried cool water  baths, Neosporin and topical witch hazel.   Review of Systems  Constitutional:  Negative for fever.  HENT:  Negative for trouble swallowing.   Respiratory:  Negative for cough, choking, shortness of breath and wheezing.   Cardiovascular:  Negative for chest pain.  Skin:  Positive for rash.       Objective:   Physical Exam NAD.  Alert, oriented.  Lungs clear.  Heart regular rate rhythm.  Multiple discrete lesions in various stages of healing noted mainly on the lower legs below the knee to the ankle and around the waist area more on the left.  A few scattered lesions are noted on the back and posterior neck.  Lesions are dark pink papules with some excoriation in various stages of healing.  No vesicular lesions are noted at this time.  Also patient has multiple white dry papules with some patches on both lower legs more on the right.  Significant skin changes from sun exposure noted. Today's Vitals   11/12/23 1051 11/12/23 1108  BP: (!) 146/76 134/64  Pulse: 86   Temp: (!) 96.8 F (36 C)   SpO2: 97%   Weight: 142 lb (64.4 kg)   Height: 4' 11 (1.499 m)    Body mass index is 28.68 kg/m.       Assessment & Plan:  Rash and nonspecific skin eruption Meds ordered this encounter  Medications   predniSONE   (DELTASONE ) 20 MG tablet    Sig: 3 po qd x 3 d then 2 po qd x 3 d then 1 po qd x 2 d    Dispense:  17 tablet    Refill:  0    Supervising Provider:   ALPHONSA HAMILTON A [[id number]]   triamcinolone  cream (KENALOG ) 0.1 %    Sig: Apply 1 Application topically 2 (two) times daily. Prn rash; use up to 2 weeks    Dispense:  30 g    Refill:  0    Supervising Provider:   ALPHONSA HAMILTON A [[id number]]   Allegra in the morning Benadryl  at night Pepcid  twice a day Cerave lotion or cream to dry skin on lower legs Call back in 7-10 days if persists, sooner if worse. Consider dermatology consult for skin cancer screening.  Return if symptoms worsen or fail to improve.

## 2023-11-19 ENCOUNTER — Other Ambulatory Visit: Payer: Self-pay | Admitting: Family Medicine

## 2023-11-25 ENCOUNTER — Other Ambulatory Visit: Payer: Self-pay | Admitting: Family Medicine

## 2023-11-25 DIAGNOSIS — G2581 Restless legs syndrome: Secondary | ICD-10-CM

## 2023-11-28 ENCOUNTER — Other Ambulatory Visit: Payer: Self-pay

## 2023-11-28 DIAGNOSIS — J441 Chronic obstructive pulmonary disease with (acute) exacerbation: Secondary | ICD-10-CM

## 2023-11-28 MED ORDER — ALBUTEROL SULFATE HFA 108 (90 BASE) MCG/ACT IN AERS: 2.0000 | INHALATION_SPRAY | Freq: Four times a day (QID) | RESPIRATORY_TRACT | 3 refills | Status: DC | PRN

## 2023-11-29 DIAGNOSIS — H35373 Puckering of macula, bilateral: Secondary | ICD-10-CM | POA: Diagnosis not present

## 2023-11-29 DIAGNOSIS — H25813 Combined forms of age-related cataract, bilateral: Secondary | ICD-10-CM | POA: Diagnosis not present

## 2023-12-03 ENCOUNTER — Other Ambulatory Visit: Payer: Self-pay | Admitting: Family Medicine

## 2023-12-03 DIAGNOSIS — J441 Chronic obstructive pulmonary disease with (acute) exacerbation: Secondary | ICD-10-CM

## 2023-12-03 MED ORDER — ALBUTEROL SULFATE HFA 108 (90 BASE) MCG/ACT IN AERS: 2.0000 | INHALATION_SPRAY | Freq: Four times a day (QID) | RESPIRATORY_TRACT | 3 refills | Status: DC | PRN

## 2023-12-03 NOTE — Telephone Encounter (Unsigned)
 Copied from CRM #[id number]. Topic: Clinical - Medication Refill >> Dec 03, 2023  9:08 AM Tiffini S wrote: Medication: albuterol  (VENTOLIN  HFA) 108 (90 Base) MCG/ACT inhaler  Has the patient contacted their pharmacy? Yes (Agent: If no, request that the patient contact the pharmacy for the refill. If patient does not wish to contact the pharmacy document the reason why and proceed with request.) (Agent: If yes, when and what did the pharmacy advise?)  This is the patient's preferred pharmacy:   [facility] - [place], [place] - [address] [place] [address] Phone: [phone] Fax: [phone]  Is this the correct pharmacy for this prescription? Yes If no, delete pharmacy and type the correct one.   Has the prescription been filled recently? Yes  Is the patient out of the medication? Yes  Has the patient been seen for an appointment in the last year OR does the patient have an upcoming appointment? Yes  Can we respond through MyChart? Yes  Agent: Please be advised that Rx refills may take up to 3 business days. We ask that you follow-up with your pharmacy.

## 2023-12-04 ENCOUNTER — Other Ambulatory Visit: Payer: Self-pay

## 2023-12-04 ENCOUNTER — Telehealth: Payer: Self-pay

## 2023-12-04 DIAGNOSIS — J441 Chronic obstructive pulmonary disease with (acute) exacerbation: Secondary | ICD-10-CM

## 2023-12-04 MED ORDER — ALBUTEROL SULFATE HFA 108 (90 BASE) MCG/ACT IN AERS: 2.0000 | INHALATION_SPRAY | Freq: Four times a day (QID) | RESPIRATORY_TRACT | 3 refills | Status: AC | PRN

## 2023-12-04 NOTE — Telephone Encounter (Signed)
 Prescription Request  12/04/2023  LOV: Visit date not found  What is the name of the medication or equipment? albuterol  (VENTOLIN  HFA) 108 (90 Base) MCG/ACT inhaler   Have you contacted your pharmacy to request a refill? Yes   Which pharmacy would you like this sent to?   [facility]    Patient notified that their request is being sent to the clinical staff for review and that they should receive a response within 2 business days.   Please advise at Mobile [phone] (mobile)

## 2023-12-21 ENCOUNTER — Other Ambulatory Visit: Payer: Self-pay | Admitting: Family Medicine

## 2023-12-21 DIAGNOSIS — I10 Essential (primary) hypertension: Secondary | ICD-10-CM

## 2024-02-06 ENCOUNTER — Telehealth: Payer: Self-pay | Admitting: Family Medicine

## 2024-02-06 DIAGNOSIS — J441 Chronic obstructive pulmonary disease with (acute) exacerbation: Secondary | ICD-10-CM

## 2024-02-06 NOTE — Telephone Encounter (Signed)
 Patient requesting Proventil  for nebulizer, not on profile

## 2024-02-06 NOTE — Telephone Encounter (Unsigned)
 Copied from CRM #[id number]. Topic: Clinical - Medication Refill >> Feb 06, 2024  5:35 PM Nathanel BROCKS wrote: Medication: albuterol  (PROVENTIL ) (2.5 MG/3ML) 0.083% nebulizer solution  Has the patient contacted their pharmacy? Yes   This is the patient's preferred pharmacy:    [facility] - [place], [place] - [address] [place] [address] Phone: [phone] Fax: [phone]  Is this the correct pharmacy for this prescription? Yes If no, delete pharmacy and type the correct one.   Has the prescription been filled recently? No  Is the patient out of the medication? Yes  Has the patient been seen for an appointment in the last year OR does the patient have an upcoming appointment? Yes  Can we respond through MyChart? Yes  Agent: Please be advised that Rx refills may take up to 3 business days. We ask that you follow-up with your pharmacy.

## 2024-02-09 ENCOUNTER — Other Ambulatory Visit: Payer: Self-pay | Admitting: Family Medicine

## 2024-02-09 MED ORDER — ALBUTEROL SULFATE (2.5 MG/3ML) 0.083% IN NEBU: 2.5000 mg | INHALATION_SOLUTION | Freq: Four times a day (QID) | RESPIRATORY_TRACT | 1 refills | Status: AC | PRN

## 2024-03-03 ENCOUNTER — Ambulatory Visit: Payer: Self-pay

## 2024-03-03 NOTE — Telephone Encounter (Signed)
 FYI Only or Action Required?: FYI only for provider: appointment scheduled on 12/2.  Patient was last seen in primary care on 11/12/2023 by Mauro Elveria BROCKS, NP.  Called Nurse Triage reporting Rash.  Symptoms began several months ago.  Interventions attempted: OTC medications: hydrocortisone, benadryl , Prescription medications: prednisone , kenalog  cream, and Rest, hydration, or home remedies.  Symptoms are: gradually worsening.  Triage Disposition: See Physician Within 24 Hours  Patient/caregiver understands and will follow disposition?: No, wishes to speak with PCP  Copied from CRM #[id number]. Topic: Clinical - Red Word Triage >> Mar 03, 2024 10:01 AM Jakyia R wrote: Patches all over her body that has been getting worst. Dry, itchy and swelling in some areas. Pt has been seen for this in office but it has become more intense. Reason for Disposition  SEVERE itching (i.e., interferes with sleep, normal activities or school)  Answer Assessment - Initial Assessment Questions Lower legs worse than others- it had started in this area- injury where she had walked into some bricks and chipped the bone- the area never recovered  Swelled and scaled at one point- fever in it- felt like it was going bust open Some of the swelling is gone. Denies systemic fever. Denies SOB, CP, Dizziness. Has some joint pain over the last 6 months but states due to being 78. Denies swelling or rash directly to her joints.   Appt made with PCP to evaluate- may need Derm referral. ED/UC precautions advised 1. APPEARANCE of RASH: What does the rash look like? (e.g., blisters, dry flaky skin, red spots, redness, sores)     Patches, feels like aligator hide 2. SIZE: How big are the spots? (e.g., tip of pen, eraser, coin; inches, centimeters)     One patch is about 6 inches from ankle to almost her knee.  3. LOCATION: Where is the rash located?     Different places but not sure if they are all the same  4. COLOR:  What color is the rash? (Note: It is difficult to assess rash color in people with darker-colored skin. When this situation occurs, simply ask the caller to describe what they see.)     redness 5. ONSET: When did the rash begin?     Ongoing since about  6. FEVER: Do you have a fever? If Yes, ask: What is your temperature, how was it measured, and when did it start?     Areas felt warm but no systemic fever.  7. ITCHING: Does the rash itch? If Yes, ask: How bad is the itch? (Scale 1-10; or mild, moderate, severe)     Scratching until its raw 8. CAUSE: What do you think is causing the rash?     unsure 9. MEDICINE FACTORS: Have you started any new medicines within the last 2 weeks? (e.g., antibiotics)      Prednisone  and Kenalog  cream  10. OTHER SYMPTOMS: Do you have any other symptoms? (e.g., dizziness, headache, sore throat, joint pain)       Joints feel like they are getting worse  Protocols used: Rash or Redness - [facility]

## 2024-03-10 ENCOUNTER — Encounter: Payer: Self-pay | Admitting: Family Medicine

## 2024-03-10 ENCOUNTER — Ambulatory Visit: Payer: Self-pay | Admitting: Family Medicine

## 2024-03-10 VITALS — BP 120/62 | HR 88 | Ht 59.0 in | Wt 140.0 lb

## 2024-03-10 DIAGNOSIS — Z23 Encounter for immunization: Secondary | ICD-10-CM

## 2024-03-10 DIAGNOSIS — R21 Rash and other nonspecific skin eruption: Secondary | ICD-10-CM | POA: Insufficient documentation

## 2024-03-10 MED ORDER — CLOBETASOL PROPIONATE 0.05 % EX OINT: 1.0000 | TOPICAL_OINTMENT | Freq: Two times a day (BID) | CUTANEOUS | 0 refills | Status: AC

## 2024-03-10 NOTE — Assessment & Plan Note (Signed)
 Chronic.  Possible psoriasis.  Referring to dermatology for consideration for biopsy.  Empiric clobetasol.

## 2024-03-10 NOTE — Progress Notes (Signed)
 Subjective:  Patient ID: [name] [name], female    DOB: 1945-05-14  Age: 78 y.o. MRN: [id number]  CC:   Chief Complaint  Patient presents with   Rash    Rash along the body, itches     HPI:  78 year old female presents for evaluation of rash.  Patient reports that she has been experiencing rash to her right anterior lower extremity  since July.  Started after an injury to the lower leg.  Has continued to persist despite using some of her husbands topical treatments for psoriasis as well as over-the-counter treatment.  She states that it is better today than it has been.  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      Social Hx   Social History   Socioeconomic History   Marital status: Married    Spouse name: Thresa   Number of children: Not on file   Years of education: Not on file   Highest education level: High school graduate  Occupational History   Occupation: Retired  Tobacco Use   Smoking status: Former    Current packs/day: 0.00    Average packs/day: 1.5 packs/day for 35.0 years (52.5 ttl pk-yrs)    Types: Cigarettes    Start date: 04/09/1968    Quit date: 04/10/2003    Years since quitting: 20.9   Smokeless tobacco: Never  Vaping Use   Vaping status: Never Used  Substance and Sexual Activity   Alcohol  use: No   Drug use: No   Sexual activity: Not Currently  Other Topics Concern   Not on file  Social History Narrative   Married 22 years in 2nd marriage.First  marriage lasted 20 years.Retired,now a Education Officer, Environmental.   Social Drivers of Corporate Investment Banker Strain: Low Risk  (10/18/2023)   Overall Financial Resource Strain (CARDIA)    Difficulty of Paying Living Expenses: Not hard at all  Food Insecurity: No Food Insecurity (10/18/2023)   Hunger Vital Sign    Worried About Running Out of Food in the Last Year: Never true    Ran Out of Food in the Last Year: Never true  Transportation Needs: No Transportation Needs (10/18/2023)   PRAPARE - Administrator, Civil Service (Medical): No    Lack of Transportation (Non-Medical): No  Physical Activity: Insufficiently Active (10/18/2023)   Exercise Vital Sign    Days of Exercise per Week: 3 days    Minutes of Exercise per Session: 30 min  Stress: No Stress Concern Present (10/18/2023)   [employer] of Occupational Health - Occupational Stress Questionnaire    Feeling of Stress: Not at all  Social Connections: Moderately Integrated (10/18/2023)   Social Connection and Isolation Panel    Frequency of Communication with Friends and Family: More than three times a week    Frequency of Social Gatherings with Friends and Family: More than three times a week    Attends Religious Services: More than 4 times per year    Active Member  of Clubs or Organizations: No    Attends Banker Meetings: Never    Marital Status: Married    Review of Systems Per HPI  Objective:  BP 120/62   Pulse 88   Ht 4' 11 (1.499 m)   Wt 140 lb (63.5 kg)   SpO2 92%   BMI 28.28 kg/m      03/10/2024    1:02 PM 11/12/2023   11:08 AM 11/12/2023   10:51 AM  BP/Weight  Systolic BP 120 134 146  Diastolic BP 62 64 76  Wt. (Lbs) 140  142  BMI 28.28 kg/m2  28.68 kg/m2    Physical Exam Constitutional:      General: She is not in acute distress.    Appearance: Normal appearance.  HENT:     Head: Normocephalic and atraumatic.  Pulmonary:     Effort: Pulmonary effort is normal. No respiratory distress.   Skin:    Comments: Anterior right lower extremity with erythematous and dry papules and patches.  Neurological:     Mental Status: She is alert.     Lab Results  Component Value Date   WBC 7.0 12/05/2022   HGB 13.2 12/05/2022   HCT 42.1 12/05/2022   PLT 321 12/05/2022   GLUCOSE 84 12/05/2022   CHOL 153 12/05/2022   TRIG 100 12/05/2022   HDL 46 12/05/2022   LDLCALC 89 12/05/2022   ALT 23 12/05/2022   AST 24 12/05/2022   NA 141 12/05/2022   K 5.1 12/05/2022   CL 102 12/05/2022   CREATININE 0.74 12/05/2022   BUN 15 12/05/2022   CO2 24 12/05/2022   TSH 0.321 (L) 12/05/2022   INR 0.90 08/14/2013   HGBA1C 5.5 12/05/2022     Assessment & Plan:  Rash Assessment & Plan: Chronic.  Possible psoriasis.  Referring to dermatology for consideration for biopsy.  Empiric clobetasol.  Orders: -     Clobetasol Propionate; Apply 1 Application topically 2 (two) times daily.  Dispense: 30 g; Refill: 0 -     Ambulatory referral to Dermatology    Follow-up:  Return if symptoms worsen or fail to improve.  Jacqulyn Ahle DO [facility]

## 2024-03-10 NOTE — Patient Instructions (Signed)
Medication as directed.  Referral placed. 

## 2024-03-11 ENCOUNTER — Other Ambulatory Visit: Payer: Self-pay

## 2024-03-11 ENCOUNTER — Ambulatory Visit: Admitting: Orthopedic Surgery

## 2024-03-11 ENCOUNTER — Encounter: Payer: Self-pay | Admitting: Orthopedic Surgery

## 2024-03-11 DIAGNOSIS — M25511 Pain in right shoulder: Secondary | ICD-10-CM | POA: Diagnosis not present

## 2024-03-11 DIAGNOSIS — M19011 Primary osteoarthritis, right shoulder: Secondary | ICD-10-CM | POA: Diagnosis not present

## 2024-03-11 NOTE — Progress Notes (Unsigned)
 1  Office Visit Note   Patient: [name] [name]           Date of Birth: February 03, 1946           MRN: [id number] Visit Date: 03/11/2024 Requested by: Bluford Jacqulyn MATSU, DO [address] [place],  [place] [address] PCP: Bluford Jacqulyn MATSU, DO  Subjective: Chief Complaint  Patient presents with   Right Shoulder - Pain    HPI: [name] [name] is a 78 y.o. female who presents to the office reporting right shoulder pain of several months duration.  She was last seen in 2023 for left shoulder.  Denies any history of injury to the right shoulder.  She is right-hand dominant.  Pain is not constant.  She reports decreased range of motion.  Most of the pain is at night when she is carrying heavy things.  Does wake her from sleep.  Does report some popping.  She reports abduction only to about 50 degrees.  Symptoms are not as bad as the left shoulder..  That shoulder underwent replacement with good result.              ROS: All systems reviewed are negative as they relate to the chief complaint within the history of present illness.  Patient denies fevers or chills.  Assessment & Plan: Visit Diagnoses:  1. Right shoulder pain, unspecified chronicity     Plan: Impression is moderate right shoulder arthritis.  Plan glenohumeral joint injection today.  Could repeat that about 2 times a year as needed for symptoms.  She does have good rotator cuff strength and reasonable motion today.  Follow-up with us  as needed.  Follow-Up Instructions: No follow-ups on file.   Orders:  Orders Placed This Encounter  Procedures   XR Shoulder Right   No orders of the defined types were placed in this encounter.     Procedures: Large Joint Inj: R glenohumeral on 03/11/2024 10:44 PM Indications: diagnostic evaluation and pain Details: 22 G 3.5 in needle, ultrasound-guided posterior approach  Arthrogram: No  Medications: 9 mL bupivacaine  0.5 %; 5 mL lidocaine  1 %; 40 mg triamcinolone  acetonide 40 MG/ML Outcome:  tolerated well, no immediate complications Procedure, treatment alternatives, risks and benefits explained, specific risks discussed. Consent was given by the patient. Immediately prior to procedure a time out was called to verify the correct patient, procedure, equipment, support staff and site/side marked as required. Patient was prepped and draped in the usual sterile fashion.       Clinical Data: No additional findings.  Objective: Vital Signs: There were no vitals taken for this visit.  Physical Exam:  Constitutional: Patient appears well-developed HEENT:  Head: Normocephalic Eyes:EOM are normal Neck: Normal range of motion Cardiovascular: Normal rate Pulmonary/chest: Effort normal Neurologic: Patient is alert Skin: Skin is warm Psychiatric: Patient has normal mood and affect  Ortho Exam: Ortho exam demonstrates range of motion on the right of 50/90/130.  Rotator cuff strength intact infraspinatus supraspinatus and subscap muscle testing.  Does report a little bit of crepitus with passive range of motion at 90 degrees of abduction.  No discrete AC joint tenderness.  Motor or sensory function to the arm is intact.  Cervical spine range of motion full.  Specialty Comments:  No specialty comments available.  Imaging: No results found.   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     Past Medical History:  Diagnosis Date   COPD (chronic obstructive pulmonary  disease) (HCC)    Depression    Dyspnea    Essential hypertension, benign 01/21/2019   History of pneumonia    2010 on ventalator   History of recurrent UTIs    Hyperlipidemia    Hypothyroidism    Insomnia 12/18/2018   Left knee DJD    Osteoporosis    Pneumonia    Pre-diabetes    PTSD (post-traumatic stress disorder)    Restless leg syndrome    Sleep apnea     Family History  Problem Relation Age of Onset   Stroke Mother    Heart disease Father    Heart attack Father    Hypertension Father    Heart attack Brother    Emphysema Daughter    Emphysema Son    Hypertension Son    Colon cancer Neg Hx    Colon polyps Neg Hx     Past Surgical History:  Procedure Laterality Date   ABDOMINAL HYSTERECTOMY     still had both ovaries   COLONOSCOPY  12/13/2004   Dr. DeMason; Normal exam   KNEE ARTHROSCOPY WITH MEDIAL MENISECTOMY Left 08/24/2013   Procedure: LEFT KNEE ARTHROSCOPY WITH MEDIAL MENISCECTOMY  ;  Surgeon: Lamar DELENA Millman, MD;  Location: [facility] OR;  Service: Orthopedics;  Laterality: Left;   PARTIAL KNEE ARTHROPLASTY Left 08/24/2013   Procedure: LEFT KNEE ARTHROPLASTY UNI COMPARTMENTAL ;  Surgeon: Lamar DELENA Millman, MD;  Location: [facility] OR;  Service: Orthopedics;  Laterality: Left;  Left knee arthroplasty uni compartment  With left knee scope medial meniscectomy     PARTIAL KNEE ARTHROPLASTY Right 04/16/2022   Procedure: UNICOMPARTMENTAL KNEE;  Surgeon: Edna Toribio DELENA, MD;  Location: [facility] ORS;  Service: Orthopedics;  Laterality: Right;   REVERSE SHOULDER ARTHROPLASTY Left 02/14/2021   Procedure: LEFT REVERSE SHOULDER ARTHROPLASTY;  Surgeon: Addie Cordella Hamilton, MD;  Location: [facility] OR;  Service: Orthopedics;  Laterality: Left;   Social History   Occupational History   Occupation: Retired  Tobacco Use   Smoking status: Former    Current packs/day: 0.00    Average packs/day: 1.5 packs/day for 35.0 years (52.5 ttl pk-yrs)    Types: Cigarettes    Start date: 04/09/1968    Quit  date: 04/10/2003    Years since quitting: 20.9   Smokeless tobacco: Never  Vaping Use   Vaping status: Never Used  Substance and Sexual Activity   Alcohol  use: No   Drug use: No   Sexual activity: Not Currently

## 2024-03-12 ENCOUNTER — Telehealth: Payer: Self-pay

## 2024-03-12 NOTE — Telephone Encounter (Signed)
 Copied from CRM #[id number]. Topic: General - Other >> Mar 12, 2024  9:02 AM Amber H wrote: Reason for CRM: Patient called and stated she thinks she left her glasses at the providers office. Described them as copper brown. Called [employer] and rep stated they have not seen any glasses. Relayed message to patient. Stated if glasses are found please contact her.    [phone]

## 2024-03-13 ENCOUNTER — Encounter: Payer: Self-pay | Admitting: Orthopedic Surgery

## 2024-03-14 MED ORDER — BUPIVACAINE HCL 0.5 % IJ SOLN
9.0000 mL | INTRAMUSCULAR | Status: AC | PRN
  Administered 2024-03-11: 9 mL via INTRA_ARTICULAR

## 2024-03-14 MED ORDER — TRIAMCINOLONE ACETONIDE 40 MG/ML IJ SUSP
40.0000 mg | INTRAMUSCULAR | Status: AC | PRN
  Administered 2024-03-11: 40 mg via INTRA_ARTICULAR

## 2024-03-14 MED ORDER — LIDOCAINE HCL 1 % IJ SOLN
5.0000 mL | INTRAMUSCULAR | Status: AC | PRN
  Administered 2024-03-11: 5 mL

## 2024-03-24 ENCOUNTER — Ambulatory Visit: Payer: Self-pay

## 2024-03-24 NOTE — Telephone Encounter (Signed)
 This RN attempted to contact patient, no answer, left voicemail message. Will place in Call Back folder.

## 2024-03-24 NOTE — Telephone Encounter (Signed)
 Copied from CRM #[id number]. Topic: Clinical - Red Word Triage >> Mar 24, 2024  4:16 PM Shanda MATSU wrote: Red Word that prompted transfer to Nurse Triage: Patient's husband calling to report that patient was trying to stand back up straight after bending down and when she did this it caused a sharp pain down her back to her left leg and then her knee, pain has not gotten better.  She wont' call and she's hardheaded

## 2024-03-24 NOTE — Telephone Encounter (Signed)
 Received call from patient's husband, reports wife having severe back pain, been going on for 1 week and having cough.  Reports she won't call , she's hardheaded, that's why I am calling for her.   Reports patient is not currently with him; he's in [facility] and will call when he gets home.  Nurse informed pt's husband, that will attempt to contact patient.

## 2024-03-24 NOTE — Telephone Encounter (Signed)
 FYI Only or Action Required?: Action required by provider: update on patient condition.  Patient was last seen in primary care on 03/10/2024 by Cook, Jayce G, DO.  Called Nurse Triage reporting Back Pain.  Symptoms began a week ago.  Interventions attempted: OTC medications: aleve and Rest, hydration, or home remedies.  Symptoms are: unchanged.  Triage Disposition: See PCP When Office is Open (Within 3 Days)  Patient/caregiver understands and will follow disposition?: No, refuses disposition  Copied from CRM #[id number]. Topic: Clinical - Red Word Triage >> Mar 24, 2024  4:16 PM Shanda MATSU wrote: Red Word that prompted transfer to Nurse Triage: Patient's husband calling to report that patient was trying to stand back up straight after bending down and when she did this it caused a sharp pain down her back to her left leg and then her knee, pain has not gotten better.  She wont' call and she's hardheaded Reason for Disposition  [1] Pain radiates into the thigh or further down the leg AND [2] one leg  Answer Assessment - Initial Assessment Questions 1. ONSET: When did the pain begin? (e.g., minutes, hours, days)     One week ago 2. LOCATION: Where does it hurt? (upper, mid or lower back)     Low back pain 3. SEVERITY: How bad is the pain?  (e.g., Scale 1-10; mild, moderate, or severe)     With activity 7/10 4. PATTERN: Is the pain constant? (e.g., yes, no; constant, intermittent)      Intermittent, pain with activity 5. RADIATION: Does the pain shoot into your legs or somewhere else?     Down right leg leg 6. CAUSE:  What do you think is causing the back pain?      Squatted to pick something up, when standing, felt a sharp pain in back radiating  down right leg 7. BACK OVERUSE:  Any recent lifting of heavy objects, strenuous work or exercise?     Squatted to pick up an object 8. MEDICINES: What have you taken so far for the pain? (e.g., nothing, acetaminophen ,  NSAIDS)     Aleve, does not help with symptoms 9. NEUROLOGIC SYMPTOMS: Do you have any weakness, numbness, or problems with bowel/bladder control?     denies 10. OTHER SYMPTOMS: Do you have any other symptoms? (e.g., fever, abdomen pain, burning with urination, blood in urine)       denies  Protocols used: Back Pain-A-AH

## 2024-03-26 ENCOUNTER — Ambulatory Visit: Payer: Self-pay

## 2024-03-26 NOTE — Telephone Encounter (Signed)
 FYI Only or Action Required?: FYI only for provider: appointment scheduled on 03/27/24.  Patient was last seen in primary care on 03/10/2024 by Cook, Jayce G, DO.  Called Nurse Triage reporting Back Pain.  Symptoms began 12 days ago.  Interventions attempted: OTC medications: advil.  Symptoms are: unchanged.  Triage Disposition: See PCP When Office is Open (Within 3 Days)  Patient/caregiver understands and will follow disposition?: Yes Reason for Disposition  [1] Age > 50 AND [2] no history of prior similar back pain  Answer Assessment - Initial Assessment Questions Patient reports she dropped something at the store and when squatting down and standing back up a shooting burning pain shot from lower back down leg.   1. ONSET: When did the pain begin? (e.g., minutes, hours, days)     03/14/24  2. LOCATION: Where does it hurt? (upper, mid or lower back)     R Lower back  3. SEVERITY: How bad is the pain?  (e.g., Scale 1-10; mild, moderate, or severe)     8/10  4. PATTERN: Is the pain constant? (e.g., yes, no; constant, intermittent)      Constant with movement  5. RADIATION: Does the pain shoot into your legs or somewhere else?     R leg into R knee  6. MEDICINES: What have you taken so far for the pain? (e.g., nothing, acetaminophen , NSAIDS)     Advil  7. NEUROLOGIC SYMPTOMS: Do you have any weakness, numbness, or problems with bowel/bladder control?     Denies  8. OTHER SYMPTOMS: Do you have any other symptoms? (e.g., fever, abdomen pain, burning with urination, blood in urine)       Denies  Protocols used: Back Pain-A-AH  Copied from CRM #[id number]. Topic: Clinical - Red Word Triage >> Mar 26, 2024  1:15 PM Gustabo D wrote: Pt says she is having burning in her lower back it shot down to her legs and knees. It happened on 03/14/24 and she tried to push through.

## 2024-03-27 ENCOUNTER — Telehealth: Payer: Self-pay | Admitting: Family Medicine

## 2024-03-27 ENCOUNTER — Ambulatory Visit (HOSPITAL_COMMUNITY)
Admission: RE | Admit: 2024-03-27 | Discharge: 2024-03-27 | Disposition: A | Discharge status: Home / Self Care | Source: Ambulatory Visit

## 2024-03-27 ENCOUNTER — Ambulatory Visit

## 2024-03-27 VITALS — BP 126/60 | HR 85 | Temp 97.7°F | Ht 59.0 in | Wt 141.0 lb

## 2024-03-27 DIAGNOSIS — M5441 Lumbago with sciatica, right side: Secondary | ICD-10-CM | POA: Insufficient documentation

## 2024-03-27 MED ORDER — PREDNISONE 20 MG PO TABS: ORAL_TABLET | ORAL | 0 refills | Status: DC

## 2024-03-27 NOTE — Telephone Encounter (Signed)
 Copied from CRM #[id number]. Topic: Clinical - Prescription Issue >> Mar 27, 2024  5:35 PM Shanda MATSU wrote: Reason for CRM: Patient calling in regards to med that provider adv her was going to be sent to pharmacy, predniSONE  (DELTASONE ) 20 MG tablet, still not showing recvd at the pharmacy, confirmed correct pharmacy is listed for where med needs to be sent, adv patient that I show med being prescribed but not being sent to pharmacy as of yet, patient is req a call back to confirm when med has been sent to pharmacy.

## 2024-03-28 ENCOUNTER — Other Ambulatory Visit: Payer: Self-pay | Admitting: Family Medicine

## 2024-03-28 MED ORDER — PREDNISONE 10 MG PO TABS: ORAL_TABLET | ORAL | 0 refills | Status: AC

## 2024-03-28 NOTE — Progress Notes (Signed)
 "  Acute Office Visit  Subjective:     Patient ID: [name] [name], female    DOB: July 01, 1945, 78 y.o.   MRN: [id number]  Chief Complaint  Patient presents with   Back Pain    Since 12/6 when getting up from squatting had sharp pain in hip, leg and knee right side    Back Pain Pertinent negatives include no fever, numbness or weakness.   Patient is in today for back pain since 03/14/2024.  Reports that she injured herself by standing from a squatting position. She then felt a pop and a sharp pain in the right side of her lower back.  Says that the pain radiates down to the hip, back of leg, and stops at the right knee.  Has been having this pain daily since this occurrence.  Says that she feels like her lower back has swelling.  Has been taking Aleve for pain with no relief.  Denies any numbness or tingling.  Denies loss of bladder function.  Reports that during the first week of her injury she had a few incidences of loss of bowel function, but since then has not had any issues.  Review of Systems  Constitutional:  Negative for fatigue and fever.  Respiratory:  Negative for cough, shortness of breath and wheezing.   Genitourinary:  Negative for difficulty urinating.  Musculoskeletal:  Positive for back pain.  Neurological:  Negative for dizziness, weakness, light-headedness and numbness.       Objective:    Today's Vitals   03/27/24 1347  BP: 126/60  Pulse: 85  Temp: 97.7 F (36.5 C)  SpO2: 96%  Weight: 141 lb (64 kg)  Height: 4' 11 (1.499 m)   Body mass index is 28.48 kg/m.   Physical Exam Vitals and nursing note reviewed.  Constitutional:      General: She is not in acute distress.    Appearance: Normal appearance. She is not ill-appearing.  Cardiovascular:     Rate and Rhythm: Normal rate and regular rhythm.     Heart sounds: Normal heart sounds, S1 normal and S2 normal. No murmur heard. Pulmonary:     Effort: Pulmonary effort is normal. No respiratory distress.      Breath sounds: Normal breath sounds. No wheezing.  Musculoskeletal:     Lumbar back: Positive right straight leg raise test. Negative left straight leg raise test.     Comments: Lumbar spine/lower back: No ecchymosis, edema, erythema, or open wounds noted to the area.  Tenderness with palpation noted on the right paraspinous area no direct spine tenderness noted.  Full range of motion noted on exam with flexion, extension, lateral flexion, and rotation of the lumbar spine. Pain elicited with flexion and lateral extension to the right. Sensation intact. Gait antalgic due to pain.  BLE: Full ROM and strength 5+ of BLE.   Neurological:     Mental Status: She is alert.  Psychiatric:        Mood and Affect: Mood normal.        Behavior: Behavior normal.        Thought Content: Thought content normal.        Judgment: Judgment normal.    No results found for any visits on 03/27/24.    Assessment & Plan:  1. Acute right-sided back pain with sciatica (Primary) -Given the nature of the injury and associated sciatica, lumbar x-rays warranted to evaluate for any underlying pathology or structural abnormalities.  -Dr. Bluford sent in prednisone   taper at local pharmacy.  -Advised patient to take tylenol  as needed for pain. Recommended topical heat and ice therapy for the next 4-6 weeks.  -Gave warning signs such as loss of bladder or bowel function or exponentially worsening back pain, and advised patient to seek immediate care if she experiences these symptoms. Patient agrees.  - DG Lumbar Spine 2-3 Views  Return if symptoms worsen or fail to improve.  Damien KATHEE Pringle, FNP  "

## 2024-03-31 ENCOUNTER — Other Ambulatory Visit: Payer: Self-pay

## 2024-04-22 ENCOUNTER — Other Ambulatory Visit: Payer: Self-pay | Admitting: Family Medicine

## 2024-04-22 DIAGNOSIS — E039 Hypothyroidism, unspecified: Secondary | ICD-10-CM

## 2024-05-07 ENCOUNTER — Telehealth: Payer: Self-pay | Admitting: Family Medicine

## 2024-05-07 NOTE — Telephone Encounter (Signed)
 Call Center sent message  Did the patient discuss referral with their provider in the last year? Yes    (If No - schedule appointment)    (If Yes - send message)        Appointment offered? No        Type of order/referral and detailed reason for visit: Dermatology        Preference of office, provider, location: Dr Norleen Hurst  [address] [place], [place] [address]        If referral order, have you been seen by this specialty before? No    (If Yes, this issue or another issue? When? Where?        Can we respond through MyChart? Yes      Had flu and was unable to make appt

## 2024-05-09 ENCOUNTER — Other Ambulatory Visit: Payer: Self-pay | Admitting: Family Medicine

## 2024-05-09 DIAGNOSIS — R21 Rash and other nonspecific skin eruption: Secondary | ICD-10-CM

## 2024-10-23 ENCOUNTER — Ambulatory Visit
